# Patient Record
Sex: Female | Born: 1995 | Race: Black or African American | Hispanic: No | Marital: Single | Smoking: Never smoker
Health system: Southern US, Community
[De-identification: ages and names within clinical notes are randomized; demographics above are authoritative.]

## PROBLEM LIST (undated history)

## (undated) DIAGNOSIS — E559 Vitamin D deficiency, unspecified: Secondary | ICD-10-CM

## (undated) DIAGNOSIS — J302 Other seasonal allergic rhinitis: Secondary | ICD-10-CM

## (undated) DIAGNOSIS — K2970 Gastritis, unspecified, without bleeding: Secondary | ICD-10-CM

## (undated) DIAGNOSIS — K219 Gastro-esophageal reflux disease without esophagitis: Secondary | ICD-10-CM

## (undated) DIAGNOSIS — F419 Anxiety disorder, unspecified: Secondary | ICD-10-CM

## (undated) DIAGNOSIS — M6281 Muscle weakness (generalized): Secondary | ICD-10-CM

## (undated) HISTORY — PX: NO PAST SURGERIES: SHX2092

## (undated) HISTORY — DX: Gastritis, unspecified, without bleeding: K29.70

## (undated) HISTORY — DX: Other seasonal allergic rhinitis: J30.2

## (undated) HISTORY — DX: Gastro-esophageal reflux disease without esophagitis: K21.9

## (undated) HISTORY — DX: Vitamin D deficiency, unspecified: E55.9

## (undated) HISTORY — DX: Muscle weakness (generalized): M62.81

## (undated) HISTORY — DX: Anxiety disorder, unspecified: F41.9

---
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
## 2013-11-03 ENCOUNTER — Emergency Department (HOSPITAL_COMMUNITY)
Admission: EM | Admit: 2013-11-03 | Discharge: 2013-11-03 | Disposition: A | Discharge status: Home / Self Care | Payer: 59 | Attending: Emergency Medicine | Admitting: Emergency Medicine

## 2013-11-03 ENCOUNTER — Encounter (HOSPITAL_COMMUNITY): Payer: Self-pay | Admitting: Emergency Medicine

## 2013-11-03 DIAGNOSIS — Y9389 Activity, other specified: Secondary | ICD-10-CM | POA: Insufficient documentation

## 2013-11-03 DIAGNOSIS — IMO0002 Reserved for concepts with insufficient information to code with codable children: Secondary | ICD-10-CM | POA: Insufficient documentation

## 2013-11-03 DIAGNOSIS — K219 Gastro-esophageal reflux disease without esophagitis: Secondary | ICD-10-CM | POA: Insufficient documentation

## 2013-11-03 DIAGNOSIS — Z8659 Personal history of other mental and behavioral disorders: Secondary | ICD-10-CM | POA: Insufficient documentation

## 2013-11-03 DIAGNOSIS — T148XXA Other injury of unspecified body region, initial encounter: Secondary | ICD-10-CM

## 2013-11-03 DIAGNOSIS — Z79899 Other long term (current) drug therapy: Secondary | ICD-10-CM | POA: Insufficient documentation

## 2013-11-03 DIAGNOSIS — Y929 Unspecified place or not applicable: Secondary | ICD-10-CM | POA: Insufficient documentation

## 2013-11-03 DIAGNOSIS — X58XXXA Exposure to other specified factors, initial encounter: Secondary | ICD-10-CM | POA: Insufficient documentation

## 2013-11-03 NOTE — Discharge Instructions (Signed)
Keep your navel clean. Follow up with your doctor for further evaluation.

## 2013-11-03 NOTE — ED Provider Notes (Signed)
Medical screening examination/treatment/procedure(s) were performed by non-physician practitioner and as supervising physician I was immediately available for consultation/collaboration.  EKG Interpretation   None        Delvis Kau F Briar Witherspoon, MD 11/03/13 1201 

## 2013-11-03 NOTE — ED Notes (Signed)
Patient says she has been having "belly button" pain for about two weeks.  The patient says she thinks when she was washing her stomach, one of her nails went in to her belly button and scratched the inside of it.  The patient rates her pain 6/10.  Patient denies discharge from her belly button, or blood, she denies N/V and fever.

## 2013-11-03 NOTE — ED Provider Notes (Signed)
CSN: [id number]     Arrival date & time 11/03/13  0762 History   This chart was scribed for non-physician practitioner Emilia Beck, PA-C, working with Shon Baton, MD, by Yevette Edwards, ED Scribe. This patient was seen in room TR11C/TR11C and the patient's care was started at 10:32 AM. First MD Initiated Contact with Patient 11/03/13 1003     Chief Complaint  Patient presents with  . Abdominal Pain    Patient is having burnig and pain inside her belly button    The history is provided by the patient. No language interpreter was used.   HPI Comments: Bethany Barrera is a 18 y.o. female, with a h/o anxiety, who presents to the Emergency Department complaining of gradually-worsening pain to her navel which began two weeks ago after accidentally scratching it. The pt rates the pain as 6/10, and she characterizes the pain as "throbbing" and  increased with bending. She denies any drainage or bleeding from the site, and she also denies dysuria, nausea, emesis, fever, or abdominal pain not associated with the umbilical region. Bethany Barrera is a non-smoker.   Past Medical History  Diagnosis Date  . GERD (gastroesophageal reflux disease)   . Anxiety    History reviewed. No pertinent past surgical history. Family History  Problem Relation Age of Onset  . Clotting disorder Maternal Grandfather   . Diabetes Maternal Grandfather   . Heart disease Maternal Grandfather   . Breast cancer Paternal Grandmother    History  Substance Use Topics  . Smoking status: Never Smoker   . Smokeless tobacco: Never Used  . Alcohol Use: No   No OB history provided.  Review of Systems  Constitutional: Negative for fever.  Gastrointestinal: Negative for nausea and vomiting.  Genitourinary: Negative for dysuria.  Skin: Positive for wound.  All other systems reviewed and are negative.   Allergies  Sulfa antibiotics  Home Medications   Current Outpatient Rx  Name  Route  Sig  Dispense   Refill  . esomeprazole (NEXIUM) 40 MG capsule   Oral   Take 1 capsule (40 mg total) by mouth daily before breakfast.   30 capsule   6   . fluticasone (FLONASE) 50 MCG/ACT nasal spray   Nasal   Place 2 sprays into the nose daily.         Lorita Officer Triphasic (TRI-SPRINTEC) 0.18/0.215/0.25 MG-35 MCG TABS   Oral   Take 1 tablet by mouth daily.           . ondansetron (ZOFRAN) 8 MG tablet   Oral   Take by mouth every 8 (eight) hours as needed.         . predniSONE (STERAPRED UNI-PAK) 5 MG TABS   Oral   Take by mouth. As directed         . EXPIRED: ranitidine (ZANTAC) 150 MG tablet   Oral   Take 1 tablet (150 mg total) by mouth at bedtime.   60 tablet   3    Triage Vitals: BP 124/68  Pulse 86  Temp(Src) 97.9 F (36.6 C) (Oral)  Resp 18  SpO2 99%  LMP 10/13/2013  Physical Exam  Nursing note and vitals reviewed. Constitutional: She is oriented to person, place, and time. She appears well-developed and well-nourished. No distress.  HENT:  Head: Normocephalic and atraumatic.  Eyes: EOM are normal.  Neck: Neck supple. No tracheal deviation present.  Cardiovascular: Normal rate.   Pulmonary/Chest: Effort normal. No respiratory distress.  Abdominal:  There is no tenderness.  No tenderness to palpation.   Musculoskeletal: Normal range of motion.  Neurological: She is alert and oriented to person, place, and time.  Skin: Skin is warm and dry.  No scab or laceration or drainage noted from navel. No color changes noted to the skin.   Psychiatric: She has a normal mood and affect. Her behavior is normal.    ED Course  Procedures (including critical care time)  DIAGNOSTIC STUDIES: Oxygen Saturation is 99% on room air, normal by my interpretation.    COORDINATION OF CARE:  10:36 AM- Discussed treatment plan with patient, which includes washing the site well and using IBU as needed, and the patient agreed to the plan.   Labs Review Labs Reviewed - No  data to display Imaging Review No results found.  EKG Interpretation   None       MDM   1. Abrasion of skin    10:45 AM Patient has no obvious laceration or drainage from navel. Patient denies any pain while I was manipulating her navel. No drainage noted. Vitals stable and patient afebrile. Patient advised to keep navel clean and follow up with her PCP as needed.   I personally performed the services described in this documentation, which was scribed in my presence. The recorded information has been reviewed and is accurate.   Emilia BeckKaitlyn Aslynn Brunetti, PA-C 11/03/13 1046

## 2014-03-03 ENCOUNTER — Encounter: Payer: Self-pay | Admitting: *Deleted

## 2014-03-09 ENCOUNTER — Encounter: Payer: Self-pay | Admitting: Women's Health

## 2014-03-16 ENCOUNTER — Encounter: Payer: Self-pay | Admitting: Women's Health

## 2014-03-16 ENCOUNTER — Ambulatory Visit (INDEPENDENT_AMBULATORY_CARE_PROVIDER_SITE_OTHER): Payer: 59 | Admitting: Women's Health

## 2014-03-16 VITALS — BP 120/80 | Ht 66.0 in | Wt 209.5 lb

## 2014-03-16 DIAGNOSIS — N949 Unspecified condition associated with female genital organs and menstrual cycle: Secondary | ICD-10-CM

## 2014-03-16 DIAGNOSIS — R102 Pelvic and perineal pain: Secondary | ICD-10-CM

## 2014-03-16 DIAGNOSIS — Z7251 High risk heterosexual behavior: Secondary | ICD-10-CM

## 2014-03-16 DIAGNOSIS — Z113 Encounter for screening for infections with a predominantly sexual mode of transmission: Secondary | ICD-10-CM

## 2014-03-16 DIAGNOSIS — R309 Painful micturition, unspecified: Secondary | ICD-10-CM

## 2014-03-16 DIAGNOSIS — R300 Dysuria: Secondary | ICD-10-CM

## 2014-03-16 DIAGNOSIS — N898 Other specified noninflammatory disorders of vagina: Secondary | ICD-10-CM

## 2014-03-16 LAB — POCT URINALYSIS DIPSTICK
Glucose, UA: NEGATIVE
Ketones, UA: NEGATIVE
Nitrite, UA: NEGATIVE
RBC UA: NEGATIVE

## 2014-03-16 LAB — POCT WET PREP (WET MOUNT): CLUE CELLS WET PREP WHIFF POC: NEGATIVE

## 2014-03-16 NOTE — Progress Notes (Signed)
Patient ID: Bethany Barrera, female   DOB: 1996/06/04, 18 y.o.   MRN: [id number]   [facility]  Patient name: Bethany BruinsHannah M Barrera MRN [id number]  Date of birth: 1996/06/04  CC & HPI:  Bethany Barrera is a 18 y.o. African American female presenting today for report of pelvic pain, malodorous d/c, and dysuria w/ vulvar itching x 1wk, also requests STI screening. Uses condom 'most of the time'. Has periods monthly, cramping has improved since she's been on COCs, occ periods will be 1-2wks late.   Pertinent History Reviewed:  Medical & Surgical Hx:   Past Medical History  Diagnosis Date  . GERD (gastroesophageal reflux disease)   . Anxiety    History reviewed. No pertinent past surgical history. Medications: Reviewed & Updated - see associated section Social History: Reviewed -  reports that she has never smoked. She has never used smokeless tobacco.  Objective Findings:  Vitals: BP 120/80  Ht 5\' 6"  (1.676 m)  Wt 209 lb 8 oz (95.029 kg)  BMI 33.83 kg/m2  LMP 03/04/2014  Physical Examination: General appearance - alert, well appearing, and in no distress Pelvic - thin whitish-yellow malodorous d/c  Results for orders placed in visit on 03/16/14 (from the past 24 hour(s))  POCT URINALYSIS DIPSTICK   Collection Time    03/16/14 12:27 PM      Result Value Ref Range   Color, UA       Clarity, UA       Glucose, UA neg     Bilirubin, UA       Ketones, UA neg     Spec Grav, UA       Blood, UA neg     pH, UA       Protein, UA trace     Urobilinogen, UA       Nitrite, UA neg     Leukocytes, UA moderate (2+)    POCT WET PREP (WET MOUNT)   Collection Time    03/16/14 12:59 PM      Result Value Ref Range   Source Wet Prep POC vaginal     WBC, Wet Prep HPF POC many     Bacteria Wet Prep HPF POC noen     BACTERIA WET PREP MORPHOLOGY POC       Clue Cells Wet Prep HPF POC None     CLUE CELLS WET PREP WHIFF POC Negative Whiff     Yeast Wet Prep HPF POC None      KOH Wet Prep POC       Trichomonas Wet Prep HPF POC none       Assessment & Plan:  A:   Pelvic pain  Malodorous d/c w/ vulvar itching  Dysuria  STI screening P:  GC/CT from urine, send urine for cx, HIV, RPR, HSV2, Hep B&C today   Will call her w/ results   Marge DuncansKimberly Randall Booker CNM, WHNP-BC 03/16/2014 1:00 PM

## 2014-03-16 NOTE — Patient Instructions (Signed)
Sexually Transmitted Disease A sexually transmitted disease (STD) is a disease or infection that may be passed (transmitted) from person to person, usually during sexual activity. This may happen by way of saliva, semen, blood, vaginal mucus, or urine. Common STDs include:   Gonorrhea.   Chlamydia.   Syphilis.   HIV and AIDS.   Genital herpes.   Hepatitis B and C.   Trichomonas.   Human papillomavirus (HPV).   Pubic lice.   Scabies.  Mites.  Bacterial vaginosis. WHAT ARE CAUSES OF STDs? An STD may be caused by bacteria, a virus, or parasites. STDs are often transmitted during sexual activity if one person is infected. However, they may also be transmitted through nonsexual means. STDs may be transmitted after:   Sexual intercourse with an infected person.   Sharing sex toys with an infected person.   Sharing needles with an infected person or using unclean piercing or tattoo needles.  Having intimate contact with the genitals, mouth, or rectal areas of an infected person.   Exposure to infected fluids during birth. WHAT ARE THE SIGNS AND SYMPTOMS OF STDs? Different STDs have different symptoms. Some people may not have any symptoms. If symptoms are present, they may include:   Painful or bloody urination.   Pain in the pelvis, abdomen, vagina, anus, throat, or eyes.   Skin rash, itching, irritation, growths, sores (lesions), ulcerations, or warts in the genital or anal area.  Abnormal vaginal discharge with or without bad odor.   Penile discharge in men.   Fever.   Pain or bleeding during sexual intercourse.   Swollen glands in the groin area.   Yellow skin and eyes (jaundice). This is seen with hepatitis.   Swollen testicles.  Infertility.  Sores and blisters in the mouth. HOW ARE STDs DIAGNOSED? To make a diagnosis, your health care provider may:   Take a medical history.   Perform a physical exam.   Take a sample of any  discharge for examination.  Swab the throat, cervix, opening to the penis, rectum, or vagina for testing.  Test a sample of your first morning urine.   Perform blood tests.   Perform a Pap smear, if this applies.   Perform a colposcopy.   Perform a laparoscopy.  HOW ARE STDs TREATED? Treatment depends on the STD. Some STDs may be treated but not cured.   Chlamydia, gonorrhea, trichomonas, and syphilis can be cured with antibiotics.   Genital herpes, hepatitis, and HIV can be treated, but not cured, with prescribed medicines. The medicines lessen symptoms.   Genital warts from HPV can be treated with medicine or by freezing, burning (electrocautery), or surgery. Warts may come back.   HPV cannot be cured with medicine or surgery. However, abnormal areas may be removed from the cervix, vagina, or vulva.   If your diagnosis is confirmed, your recent sexual partners need treatment. This is true even if they are symptom-free or have a negative culture or evaluation. They should not have sex until their health care providers say it is OK. HOW CAN I REDUCE MY RISK OF GETTING AN STD?  Use latex condoms, dental dams, and water-soluble lubricants during sexual activity. Do not use petroleum jelly or oils.  Get vaccinated for HPV and hepatitis. If you have not received these vaccines in the past, talk to your health care provider about whether one or both might be right for you.   Avoid risky sex practices that can break the skin.  WHAT SHOULD   I DO IF I THINK I HAVE AN STD?  See your health care provider.   Inform all sexual partners. They should be tested and treated for any STDs.  Do not have sex until your health care provider says it is OK. WHEN SHOULD I GET HELP? Seek immediate medical care if:  You develop severe abdominal pain.  You are a man and notice swelling or pain in the testicles.  You are a woman and notice swelling or pain in your vagina. Document  Released: 12/15/2002 Document Revised: 07/15/2013 Document Reviewed: 04/14/2013 [employer] Patient Information 2014 [employer].  

## 2014-03-17 ENCOUNTER — Telehealth: Payer: Self-pay | Admitting: Women's Health

## 2014-03-17 LAB — HIV ANTIBODY (ROUTINE TESTING W REFLEX): HIV 1&2 Ab, 4th Generation: NONREACTIVE

## 2014-03-17 LAB — HSV 2 ANTIBODY, IGG

## 2014-03-17 LAB — GC/CHLAMYDIA PROBE AMP
CT PROBE, AMP APTIMA: NEGATIVE
GC Probe RNA: NEGATIVE

## 2014-03-17 LAB — URINE CULTURE

## 2014-03-17 LAB — HEPATITIS C ANTIBODY: HCV AB: NEGATIVE

## 2014-03-17 LAB — HEPATITIS B SURFACE ANTIGEN: Hepatitis B Surface Ag: NEGATIVE

## 2014-03-17 LAB — RPR

## 2014-03-17 MED ORDER — NAPROXEN 500 MG PO TABS: 500.0000 mg | ORAL_TABLET | Freq: Two times a day (BID) | ORAL | Status: DC

## 2014-03-17 MED ORDER — METRONIDAZOLE 0.75 % VA GEL: 1.0000 | Freq: Every day | VAGINAL | Status: DC

## 2014-03-17 NOTE — Telephone Encounter (Signed)
Notified pt of neg STI screening, will try metrogel for her malodorous d/c w/ vulvar itching. Still having Lt sided pelvic pain not r/b apap, ibuprofen. Will schedule for pelvic u/s and f/u w/ me in 1wk, rx naprosyn 500mg  q 12hr.  Cheral Marker, CNM, WHNP-BC 03/17/2014 3:10 PM

## 2014-03-23 ENCOUNTER — Ambulatory Visit: Payer: 59 | Admitting: Women's Health

## 2014-03-23 ENCOUNTER — Other Ambulatory Visit: Payer: 59

## 2014-03-24 ENCOUNTER — Other Ambulatory Visit: Payer: Self-pay | Admitting: Women's Health

## 2014-03-24 DIAGNOSIS — R102 Pelvic and perineal pain: Secondary | ICD-10-CM

## 2014-03-30 ENCOUNTER — Ambulatory Visit (INDEPENDENT_AMBULATORY_CARE_PROVIDER_SITE_OTHER): Payer: 59

## 2014-03-30 ENCOUNTER — Other Ambulatory Visit: Payer: Self-pay | Admitting: Women's Health

## 2014-03-30 ENCOUNTER — Encounter: Payer: Self-pay | Admitting: Women's Health

## 2014-03-30 ENCOUNTER — Ambulatory Visit (INDEPENDENT_AMBULATORY_CARE_PROVIDER_SITE_OTHER): Payer: 59 | Admitting: Women's Health

## 2014-03-30 VITALS — BP 110/88 | Ht 66.0 in | Wt 208.0 lb

## 2014-03-30 DIAGNOSIS — N949 Unspecified condition associated with female genital organs and menstrual cycle: Secondary | ICD-10-CM

## 2014-03-30 DIAGNOSIS — R102 Pelvic and perineal pain: Secondary | ICD-10-CM

## 2014-03-30 DIAGNOSIS — K5900 Constipation, unspecified: Secondary | ICD-10-CM

## 2014-03-30 DIAGNOSIS — N898 Other specified noninflammatory disorders of vagina: Secondary | ICD-10-CM

## 2014-03-30 LAB — POCT WET PREP (WET MOUNT): CLUE CELLS WET PREP WHIFF POC: NEGATIVE

## 2014-03-30 NOTE — Progress Notes (Signed)
Patient ID: Bethany Barrera, female   DOB: Oct 15, 1995, 18 y.o.   MRN: [id number]   [facility]  Patient name: Bethany BruinsHannah M Barrera MRN [id number]  Date of birth: Oct 15, 1995  CC & HPI:  Bethany Barrera is a 18 y.o. African American female presenting today for f/u after pelvic u/s for pelvic pain. At last visit she was given metrogel for abnormal d/c w/ itching- which helped, then she states it returned when she stopped using it. Doesn't have odor today. Still have occ lower abd/pelvic pain and LBP. U/S normal.  BMs q 3-4d w/ straining. Had neg STI screening last visit.   Pertinent History Reviewed:  Medical & Surgical Hx:   Past Medical History  Diagnosis Date  . GERD (gastroesophageal reflux disease)   . Anxiety    History reviewed. No pertinent past surgical history. Medications: Reviewed & Updated - see associated section Social History: Reviewed -  reports that she has never smoked. She has never used smokeless tobacco.  Objective Findings:  Vitals: BP 110/88  Ht 5\' 6"  (1.676 m)  Wt 208 lb (94.348 kg)  BMI 33.59 kg/m2  LMP 03/29/2014  Physical Examination: General appearance - alert, well appearing, and in no distress Pelvic - normal external genitalia, vulva, vagina, cervix, uterus and adnexa, on menses- no malodor  Today's pelvic u/s:Uterus 6.5 x 3.9 x 2.5 cm, anteverted  Endometrium 3.8 mm, symmetrical,  Right ovary 2.7 x 2.1 x 1.4 cm,  Left ovary 2.6 x 1.7 x 1.5 cm,  No free fluid or adnexal masses noted within the pelvis  Technician Comments:  Anteverted uterus, Endom-3.808mm symmetrical, bilateral adnexa/ovaries appear WNL no free fluid or adnexal masses noted within the pelvis  Chari ManningMcBride, Tasha  03/30/2014  11:51 AM  Results for orders placed in visit on 03/30/14 (from the past 24 hour(s))  POCT WET PREP (WET MOUNT)   Collection Time    03/30/14 12:53 PM      Result Value Ref Range   Source Wet Prep POC vaginal     WBC, Wet Prep HPF POC none      Bacteria Wet Prep HPF POC none     BACTERIA WET PREP MORPHOLOGY POC       Clue Cells Wet Prep HPF POC None     CLUE CELLS WET PREP WHIFF POC Negative Whiff     Yeast Wet Prep HPF POC None     KOH Wet Prep POC       Trichomonas Wet Prep HPF POC none       Assessment & Plan:  A:   Pelvic pain w/o clear etiology, u/s normal  Normal vaginal d/c  Constipation P:  Reviewed constipation relief measures, printed info given  Continue condoms for STI prevention, COCs for contraception   F/U prn, then in Feb when turns 19yo for pap & physical  Call if malodorous d/c, itching/irritation returns  Marge DuncansBooker, Kimberly Randall CNM, [facility]NP-BC 03/30/2014 12:54 PM

## 2014-03-30 NOTE — Patient Instructions (Signed)

## 2014-04-29 ENCOUNTER — Encounter: Payer: Self-pay | Admitting: Advanced Practice Midwife

## 2014-04-29 ENCOUNTER — Ambulatory Visit (INDEPENDENT_AMBULATORY_CARE_PROVIDER_SITE_OTHER): Payer: 59 | Admitting: Advanced Practice Midwife

## 2014-04-29 VITALS — BP 100/80 | Ht 66.0 in | Wt 213.0 lb

## 2014-04-29 DIAGNOSIS — Z7251 High risk heterosexual behavior: Secondary | ICD-10-CM

## 2014-04-29 DIAGNOSIS — Z113 Encounter for screening for infections with a predominantly sexual mode of transmission: Secondary | ICD-10-CM

## 2014-04-29 NOTE — Progress Notes (Signed)
[facility]  Patient name: Bethany Barrera MRN [id number]  Date of birth: [birth year]  CC & HPI:  LEZLI BUENROSTRO is a 18 y.o. African American female presenting today for STD screening and increased vaginal discharge with odor.  She thinks it is BV again, states she has treated herself around 3 times in the last 6 months for it.  She had full STD testing a month ago, but does not really trust her boyfriend.  Does not use condoms  Pertinent History Reviewed:  Medical & Surgical Hx:   Past Medical History  Diagnosis Date  . GERD (gastroesophageal reflux disease)   . Anxiety    History reviewed. No pertinent past surgical history. Medications: Reviewed & Updated - see associated section Social History: Reviewed -  reports that she has never smoked. She has never used smokeless tobacco.  Objective Findings:  Vitals: BP 100/80  Ht 5\' 6"  (1.676 m)  Wt 213 lb (96.616 kg)  BMI 34.40 kg/m2  LMP 04/07/2014  Physical Examination: General appearance - alert, well appearing, and in no distress Mental status - alert, oriented to person, place, and time Abdomen - soft, nontender, nondistended, no masses or organomegaly Pelvic - SSE:  Thick yellow discharge, sl amine odor. Wet prep TNTC WBC;s, no trich or yeast.  Very few clue cells.  Negative CMT Skin - normal coloration and turgor, no rashes, no suspicious skin lesions noted  No results found for this or any previous visit (from the past 24 hour(s)).   Assessment & Plan:  A:   Leukorrhea  STD screening P:  Condoms!!  Clindesse sample given and vaginal probiotic info given; Consider cipro if cultures negative and sx persist.  Pt aware that insurance companies may not cover STD screening every month  F/U prn   CRESENZO-DISHMAN,Hershey Knauer CNM 04/29/2014 9:43 AM

## 2014-04-29 NOTE — Patient Instructions (Signed)
UP4 ADULT Superior, compatible and safe strains DDS  -1 L.acidophilus (super strain) with B. Longum, B.Bifidum & B.Infantis Acid-resistant-survives stomach acid and Bile salts   Fortified with prebiotic Fructooligosaccharide to enhance growth and performance Potency: 15 Billion CFU/capsule Dosage: 1 capsule daily, best before a meal.  [employer]:  $21.90 for 60 caps   iF THERE IS STILL A PROBLEM ADD,   FLORAJEN ACIDOPHILUS High Potency Acidophilus Florajen high potency acidophilus is especially effective for restoring and maintaining a healthy, comfortable balance of vaginal flora. It is also beneficial for overall intestinal health and maintaining the immune system. 20 Billion live cultures per capsule Available in 30 and 60 capsules   [employer]:  $19.99 for 60 caps  

## 2014-04-30 LAB — HIV ANTIBODY (ROUTINE TESTING W REFLEX): HIV 1&2 Ab, 4th Generation: NONREACTIVE

## 2014-04-30 LAB — HEPATITIS B SURFACE ANTIGEN: HEP B S AG: NEGATIVE

## 2014-04-30 LAB — GC/CHLAMYDIA PROBE AMP
CT PROBE, AMP APTIMA: NEGATIVE
GC PROBE AMP APTIMA: NEGATIVE

## 2014-04-30 LAB — RPR

## 2014-05-03 LAB — HSV 2 ANTIBODY, IGG

## 2014-06-21 ENCOUNTER — Emergency Department (HOSPITAL_COMMUNITY)
Admission: EM | Admit: 2014-06-21 | Discharge: 2014-06-21 | Disposition: A | Discharge status: Home / Self Care | Payer: 59 | Attending: Emergency Medicine | Admitting: Emergency Medicine

## 2014-06-21 ENCOUNTER — Encounter (HOSPITAL_COMMUNITY): Payer: Self-pay | Admitting: Emergency Medicine

## 2014-06-21 DIAGNOSIS — Z791 Long term (current) use of non-steroidal anti-inflammatories (NSAID): Secondary | ICD-10-CM | POA: Diagnosis not present

## 2014-06-21 DIAGNOSIS — Z8659 Personal history of other mental and behavioral disorders: Secondary | ICD-10-CM | POA: Insufficient documentation

## 2014-06-21 DIAGNOSIS — R197 Diarrhea, unspecified: Secondary | ICD-10-CM | POA: Insufficient documentation

## 2014-06-21 DIAGNOSIS — R51 Headache: Secondary | ICD-10-CM | POA: Insufficient documentation

## 2014-06-21 DIAGNOSIS — IMO0002 Reserved for concepts with insufficient information to code with codable children: Secondary | ICD-10-CM | POA: Insufficient documentation

## 2014-06-21 DIAGNOSIS — K219 Gastro-esophageal reflux disease without esophagitis: Secondary | ICD-10-CM | POA: Diagnosis not present

## 2014-06-21 DIAGNOSIS — N390 Urinary tract infection, site not specified: Secondary | ICD-10-CM | POA: Diagnosis not present

## 2014-06-21 DIAGNOSIS — M542 Cervicalgia: Secondary | ICD-10-CM | POA: Diagnosis not present

## 2014-06-21 DIAGNOSIS — H8000 Otosclerosis involving oval window, nonobliterative, unspecified ear: Secondary | ICD-10-CM | POA: Diagnosis not present

## 2014-06-21 DIAGNOSIS — Z3202 Encounter for pregnancy test, result negative: Secondary | ICD-10-CM | POA: Insufficient documentation

## 2014-06-21 DIAGNOSIS — Z79899 Other long term (current) drug therapy: Secondary | ICD-10-CM | POA: Insufficient documentation

## 2014-06-21 DIAGNOSIS — R112 Nausea with vomiting, unspecified: Secondary | ICD-10-CM | POA: Diagnosis not present

## 2014-06-21 LAB — CBC WITH DIFFERENTIAL/PLATELET
Basophils Absolute: 0 10*3/uL (ref 0.0–0.1)
Basophils Relative: 0 % (ref 0–1)
EOS PCT: 0 % (ref 0–5)
Eosinophils Absolute: 0 10*3/uL (ref 0.0–0.7)
HEMATOCRIT: 39.5 % (ref 36.0–46.0)
HEMOGLOBIN: 13.8 g/dL (ref 12.0–15.0)
Lymphocytes Relative: 17 % (ref 12–46)
Lymphs Abs: 2.1 10*3/uL (ref 0.7–4.0)
MCH: 27.5 pg (ref 26.0–34.0)
MCHC: 34.9 g/dL (ref 30.0–36.0)
MCV: 78.7 fL (ref 78.0–100.0)
MONO ABS: 0.6 10*3/uL (ref 0.1–1.0)
Monocytes Relative: 5 % (ref 3–12)
Neutro Abs: 9.5 10*3/uL — ABNORMAL HIGH (ref 1.7–7.7)
Neutrophils Relative %: 78 % — ABNORMAL HIGH (ref 43–77)
Platelets: 339 10*3/uL (ref 150–400)
RBC: 5.02 MIL/uL (ref 3.87–5.11)
RDW: 13.3 % (ref 11.5–15.5)
WBC: 12.2 10*3/uL — AB (ref 4.0–10.5)

## 2014-06-21 LAB — URINALYSIS, ROUTINE W REFLEX MICROSCOPIC
GLUCOSE, UA: NEGATIVE mg/dL
KETONES UR: 40 mg/dL — AB
Nitrite: NEGATIVE
Specific Gravity, Urine: 1.025 (ref 1.005–1.030)
Urobilinogen, UA: 0.2 mg/dL (ref 0.0–1.0)
pH: 6 (ref 5.0–8.0)

## 2014-06-21 LAB — COMPREHENSIVE METABOLIC PANEL
ALT: 7 U/L (ref 0–35)
AST: 13 U/L (ref 0–37)
Albumin: 3.2 g/dL — ABNORMAL LOW (ref 3.5–5.2)
Alkaline Phosphatase: 91 U/L (ref 39–117)
Anion gap: 14 (ref 5–15)
BILIRUBIN TOTAL: 0.2 mg/dL — AB (ref 0.3–1.2)
BUN: 7 mg/dL (ref 6–23)
CALCIUM: 8.7 mg/dL (ref 8.4–10.5)
CHLORIDE: 102 meq/L (ref 96–112)
CO2: 21 mEq/L (ref 19–32)
CREATININE: 0.78 mg/dL (ref 0.50–1.10)
GFR calc non Af Amer: >90 mL/min (ref >90)
GLUCOSE: 87 mg/dL (ref 70–99)
Potassium: 4.5 mEq/L (ref 3.7–5.3)
Sodium: 137 mEq/L (ref 137–147)
Total Protein: 7.5 g/dL (ref 6.0–8.3)

## 2014-06-21 LAB — URINE MICROSCOPIC-ADD ON

## 2014-06-21 LAB — POC URINE PREG, ED: Preg Test, Ur: NEGATIVE

## 2014-06-21 MED ORDER — OXYCODONE-ACETAMINOPHEN 5-325 MG PO TABS: 1.0000 | ORAL_TABLET | ORAL | Status: DC | PRN

## 2014-06-21 MED ORDER — KETOROLAC TROMETHAMINE 30 MG/ML IJ SOLN
30.0000 mg | Freq: Once | INTRAMUSCULAR | Status: AC
  Administered 2014-06-21: 30 mg via INTRAVENOUS
  Filled 2014-06-21: qty 1

## 2014-06-21 MED ORDER — DEXTROSE 5 % IV SOLN
1.0000 g | Freq: Once | INTRAVENOUS | Status: AC
  Administered 2014-06-21: 1 g via INTRAVENOUS
  Filled 2014-06-21: qty 10

## 2014-06-21 MED ORDER — SODIUM CHLORIDE 0.9 % IV BOLUS (SEPSIS)
1000.0000 mL | Freq: Once | INTRAVENOUS | Status: AC
  Administered 2014-06-21: 1000 mL via INTRAVENOUS

## 2014-06-21 MED ORDER — PROMETHAZINE HCL 25 MG/ML IJ SOLN
25.0000 mg | Freq: Once | INTRAMUSCULAR | Status: AC
  Administered 2014-06-21: 25 mg via INTRAVENOUS
  Filled 2014-06-21: qty 1

## 2014-06-21 MED ORDER — CEPHALEXIN 500 MG PO CAPS: 500.0000 mg | ORAL_CAPSULE | Freq: Three times a day (TID) | ORAL | Status: DC

## 2014-06-21 MED ORDER — ONDANSETRON 8 MG PO TBDP: 8.0000 mg | ORAL_TABLET | Freq: Three times a day (TID) | ORAL | Status: DC | PRN

## 2014-06-21 NOTE — Discharge Instructions (Signed)

## 2014-06-21 NOTE — ED Notes (Signed)
Patient sent from [facility] for treatment of severe headache x 3 days. Patient also complaining of vomiting and diarrhea.

## 2014-06-21 NOTE — ED Notes (Signed)
Pt c/o HA x 3 days along with some neck soreness.  States it hurts to turn her head.  Headache located in parietal areas on both sides, described as pounding and throbbing, rated 9/10.  Pt took ibuprofen at home without relief. Denies hx of migraines.  Pt c/o of n/v/d x 3 days.  Reports dizziness and weakness.   Pt also reports ear ache.  NAD, respirations equal and unlabored, skin warm and dry, mucous membranes moist and pink.

## 2014-06-21 NOTE — ED Provider Notes (Signed)
CSN: [id number]     Arrival date & time 06/21/14  0810 History  This chart was scribed for Hilario Quarry, MD by Milly Jakob, ED Scribe. The patient was seen in room APA09/APA09. Patient's care was started at 8:34 AM.   Chief Complaint  Patient presents with  . Headache   Patient is a 18 y.o. female presenting with headaches. No language interpreter was used.  Headache Pain location:  Frontal Quality:  Stabbing Radiates to:  L neck and R neck Severity currently:  9/10 Onset quality:  Gradual Progression:  Worsening Chronicity:  Recurrent Similar to prior headaches: no   Relieved by:  Nothing Worsened by:  Neck movement Ineffective treatments:  NSAIDs Associated symptoms: ear pain and neck pain    HPI Comments: Bethany Barrera is a 18 y.o. female who presents to the Emergency Department complaining of a severe, gradually onset headache which began 4 days ago. She rates the pain as a 9/10 currently. She reports that the pain began as a tightness in the back of her neck bilaterally, and it progressed into a constant pounding pain in the top of her head. She reports associated pain in her ears bilaterally. She reports taking Tylenol last night with minimal relief. She reports associated nausea and 1 episode of vomiting last night, 2 episodes Sunday, 1 episode Saturday, and 1 episode Friday. She reports associated diarrhea two or three times per day. She reports eating less than usual. She denies a headache this severe in the past, congestion, or fever. She reports a history of headache, but denies being prescribed tramadol for headaches in the past. She denies a history of allergies or sinus problems. She states that her periods are regular. She is unsure whether her immunizations are UTD. She reports an allergy to Sulfa and Tramadol.    Past Medical History  Diagnosis Date  . GERD (gastroesophageal reflux disease)   . Anxiety    History reviewed. No pertinent past surgical  history. Family History  Problem Relation Age of Onset  . Clotting disorder Maternal Grandfather   . Diabetes Maternal Grandfather   . Heart disease Maternal Grandfather   . Breast cancer Paternal Grandmother   . Diabetes Mother   . Other Mother     gallstones  . Other Sister     pre-diabetes  . Other Sister     gallstones   History  Substance Use Topics  . Smoking status: Never Smoker   . Smokeless tobacco: Never Used  . Alcohol Use: No   OB History   Grav Para Term Preterm Abortions TAB SAB Ect Mult Living                 Review of Systems  HENT: Positive for ear pain.   Musculoskeletal: Positive for neck pain.  Neurological: Positive for headaches.  All other systems reviewed and are negative.  Allergies  Sulfa antibiotics and Tramadol  Home Medications   Prior to Admission medications   Medication Sig Start Date End Date Taking? Authorizing Provider  Docusate Sodium (COLACE PO) Take by mouth 2 (two) times daily.    Historical Provider, MD  esomeprazole (NEXIUM) 40 MG capsule Take 1 capsule (40 mg total) by mouth daily before breakfast. 12/11/11   Beverley Fiedler, MD  fluticasone [facility] ALLERGY RELIEF) 50 MCG/ACT nasal spray Place into both nostrils daily.    Historical Provider, MD  Multiple Vitamin (MULTIVITAMIN WITH MINERALS) TABS tablet Take 1 tablet by mouth daily.  Historical Provider, MD  naproxen (NAPROSYN) 500 MG tablet Take 1 tablet (500 mg total) by mouth 2 (two) times daily with a meal. 03/17/14   Marge Duncans, CNM  Norgestim-Eth Estrad Triphasic (TRI-SPRINTEC) 0.18/0.215/0.25 MG-35 MCG TABS Take 1 tablet by mouth daily.   11/30/10   Historical Provider, MD  ranitidine (ZANTAC) 150 MG tablet Take 1 tablet (150 mg total) by mouth at bedtime. 12/11/11 12/10/12  Beverley Fiedler, MD   Triage Vitals: BP 117/69  Pulse 76  Temp(Src) 98.3 F (36.8 C) (Oral)  Ht  (1.676 m)  Wt 210 lb (95.255 kg)  BMI 33.91 kg/m2  SpO2 99%  LMP 06/07/2014 Physical  Exam  Nursing note and vitals reviewed. Constitutional:  Awake, alert, nontoxic appearance with baseline speech for patient.  HENT:  Head: Atraumatic.  Right Ear: Tympanic membrane normal.  Left Ear: Tympanic membrane normal.  Mouth/Throat: No oropharyngeal exudate.  Eyes: EOM are normal. Pupils are equal, round, and reactive to light. Right eye exhibits no discharge. Left eye exhibits no discharge.  Neck: Neck supple.  Cardiovascular: Normal rate and regular rhythm.   No murmur heard. Pulmonary/Chest: Effort normal and breath sounds normal. No stridor. No respiratory distress. She has no wheezes. She has no rales. She exhibits no tenderness.  Abdominal: Soft. Bowel sounds are normal. She exhibits no mass. There is no tenderness. There is no rebound.  Musculoskeletal: She exhibits no tenderness.  Baseline ROM, moves extremities with no obvious new focal weakness.  Lymphadenopathy:    She has no cervical adenopathy.  Neurological:  Awake, alert, cooperative and aware of situation; motor strength bilaterally; sensation normal to light touch bilaterally; peripheral visual fields full to confrontation; no facial asymmetry; tongue midline; major cranial nerves appear intact; no pronator drift, normal finger to nose bilaterally, baseline gait without new ataxia.  Skin: No rash noted.  Psychiatric: She has a normal mood and affect.    ED Course  Procedures (including critical care time) DIAGNOSTIC STUDIES: Oxygen Saturation is 99% on room air, normal by my interpretation.    COORDINATION OF CARE: 8:42 AM-Discussed treatment plan which includes CBC, UA, Phenergan, Toradol, and IV fluids with pt at bedside and pt agreed to plan.   11:28 AM - Discussed Urinary Tract Infection with the Pt, and treatment plan including ABX, nausea medication, and pain medication. Discussed plan to follow up in 4 days if symptoms are not improved. Pt was agreeable to plan.  Labs Review Labs Reviewed  CBC WITH  DIFFERENTIAL - Abnormal; Notable for the following:    WBC 12.2 (*)    Neutrophils Relative % 78 (*)    Neutro Abs 9.5 (*)    All other components within normal limits  COMPREHENSIVE METABOLIC PANEL - Abnormal; Notable for the following:    Albumin 3.2 (*)    Total Bilirubin 0.2 (*)    All other components within normal limits  URINALYSIS, ROUTINE W REFLEX MICROSCOPIC - Abnormal; Notable for the following:    Hgb urine dipstick TRACE (*)    Bilirubin Urine MODERATE (*)    Ketones, ur 40 (*)    Protein, ur TRACE (*)    Leukocytes, UA MODERATE (*)    All other components within normal limits  URINE MICROSCOPIC-ADD ON - Abnormal; Notable for the following:    Squamous Epithelial / LPF MANY (*)    Bacteria, UA MANY (*)    All other components within normal limits  POC URINE PREG, ED    Imaging Review  No results found.   EKG Interpretation None      MDM   Final diagnoses:  UTI (lower urinary tract infection)  18 y.o. Female with headache, nausea, vomiting and diarrhea.  Urinalysis significant for uti.  Treated here with rocephin.  Plan op keflex and antiemetic.  Patient with symptoms improved here- no vomiting or diarrhea, headache better.  Discussed return precautions and need for follow up and patient and mother voice understanding.   I personally performed the services described in this documentation, which was scribed in my presence. The recorded information has been reviewed and considered. t}   Hilario Quarry, MD 06/25/14 [phone]

## 2014-06-29 ENCOUNTER — Ambulatory Visit (HOSPITAL_COMMUNITY)
Admission: RE | Admit: 2014-06-29 | Discharge: 2014-06-29 | Disposition: A | Discharge status: Home / Self Care | Payer: 59 | Source: Ambulatory Visit | Attending: Internal Medicine | Admitting: Internal Medicine

## 2014-06-29 ENCOUNTER — Other Ambulatory Visit (HOSPITAL_COMMUNITY): Payer: Self-pay | Admitting: Internal Medicine

## 2014-06-29 DIAGNOSIS — M4040 Postural lordosis, site unspecified: Secondary | ICD-10-CM | POA: Insufficient documentation

## 2014-06-29 DIAGNOSIS — M542 Cervicalgia: Secondary | ICD-10-CM | POA: Insufficient documentation

## 2014-07-05 ENCOUNTER — Other Ambulatory Visit (HOSPITAL_COMMUNITY): Payer: Self-pay | Admitting: Internal Medicine

## 2014-07-05 DIAGNOSIS — R51 Headache: Secondary | ICD-10-CM

## 2014-07-05 DIAGNOSIS — M5020 Other cervical disc displacement, unspecified cervical region: Secondary | ICD-10-CM

## 2014-07-05 DIAGNOSIS — R519 Headache, unspecified: Secondary | ICD-10-CM

## 2014-07-07 ENCOUNTER — Ambulatory Visit (HOSPITAL_COMMUNITY)
Admission: RE | Admit: 2014-07-07 | Discharge: 2014-07-07 | Disposition: A | Discharge status: Home / Self Care | Payer: 59 | Source: Ambulatory Visit | Attending: Internal Medicine | Admitting: Internal Medicine

## 2014-07-07 ENCOUNTER — Other Ambulatory Visit (HOSPITAL_COMMUNITY): Payer: Self-pay | Admitting: Internal Medicine

## 2014-07-07 DIAGNOSIS — R519 Headache, unspecified: Secondary | ICD-10-CM

## 2014-07-07 DIAGNOSIS — M5020 Other cervical disc displacement, unspecified cervical region: Secondary | ICD-10-CM

## 2014-07-07 DIAGNOSIS — R51 Headache: Secondary | ICD-10-CM

## 2014-07-07 DIAGNOSIS — M542 Cervicalgia: Secondary | ICD-10-CM | POA: Diagnosis present

## 2014-09-15 ENCOUNTER — Encounter: Payer: Self-pay | Admitting: Internal Medicine

## 2014-09-22 ENCOUNTER — Ambulatory Visit: Payer: 59 | Admitting: Advanced Practice Midwife

## 2014-09-23 ENCOUNTER — Ambulatory Visit: Payer: 59 | Admitting: Advanced Practice Midwife

## 2014-09-28 ENCOUNTER — Encounter: Payer: Self-pay | Admitting: Advanced Practice Midwife

## 2014-09-28 ENCOUNTER — Ambulatory Visit (INDEPENDENT_AMBULATORY_CARE_PROVIDER_SITE_OTHER): Payer: 59 | Admitting: Advanced Practice Midwife

## 2014-09-28 VITALS — BP 118/76 | Ht 66.0 in | Wt 217.0 lb

## 2014-09-28 DIAGNOSIS — N898 Other specified noninflammatory disorders of vagina: Secondary | ICD-10-CM

## 2014-09-28 LAB — POCT URINALYSIS DIPSTICK
Blood, UA: 3
GLUCOSE UA: NEGATIVE
Ketones, UA: NEGATIVE
NITRITE UA: NEGATIVE
Protein, UA: NEGATIVE

## 2014-09-28 MED ORDER — FLUCONAZOLE 150 MG PO TABS: 150.0000 mg | ORAL_TABLET | Freq: Every day | ORAL | Status: DC

## 2014-09-28 MED ORDER — METRONIDAZOLE 500 MG PO TABS: 500.0000 mg | ORAL_TABLET | Freq: Two times a day (BID) | ORAL | Status: DC

## 2014-09-28 NOTE — Progress Notes (Signed)
[facility] Visit  Patient name: Bethany Barrera MRN [id number]  Date of birth: [birth year]  CC & HPI:  Bethany Barrera is a 18 y.o. African American female presenting today for vaginal itching and discharge for a month. Has taken 2 abx last month for UTI  Pertinent History Reviewed:  Medical & Surgical Hx:   Past Medical History  Diagnosis Date  . GERD (gastroesophageal reflux disease)   . Anxiety    History reviewed. No pertinent past surgical history. Current outpatient prescriptions: esomeprazole (NEXIUM) 40 MG capsule, Take 1 capsule (40 mg total) by mouth daily before breakfast., Disp: 30 capsule, Rfl: 6;  fluticasone (FLONASE ALLERGY RELIEF) 50 MCG/ACT nasal spray, Place into both nostrils daily., Disp: , Rfl: ;  Norgestim-Eth Estrad Triphasic (TRI-SPRINTEC) 0.18/0.215/0.25 MG-35 MCG TABS, Take 1 tablet by mouth daily.  , Disp: , Rfl:  fluconazole (DIFLUCAN) 150 MG tablet, Take 1 tablet (150 mg total) by mouth daily. X1; may repeat in 3 days if symptoms still present, Disp: 2 tablet, Rfl: 2;  metroNIDAZOLE (FLAGYL) 500 MG tablet, Take 1 tablet (500 mg total) by mouth 2 (two) times daily., Disp: 14 tablet, Rfl: 0 Social History: Reviewed -  reports that she has never smoked. She has never used smokeless tobacco.  Objective Findings:  Vitals: BP 118/76 mmHg  Ht 5\' 6"  (1.676 m)  Wt 217 lb (98.431 kg)  BMI 35.04 kg/m2  LMP 09/28/2014  Physical Examination: General appearance - alert, well appearing, and in no distress Mental status - alert, oriented to person, place, and time Pelvic - Vulva erythemous.  SSE:  Menstrual blood and white plaques on vaginal sidewalls.  Wet prep:  Few WBC, no trich.  + yeast and few clue Results for orders placed or performed in visit on 09/28/14 (from the past 24 hour(s))  POCT urinalysis dipstick   Collection Time: 09/28/14  8:43 AM  Result Value Ref Range   Color, UA     Clarity, UA     Glucose, UA neg    Bilirubin, UA     Ketones, UA neg    Spec Grav, UA     Blood, UA 3    pH, UA     Protein, UA neg    Urobilinogen, UA     Nitrite, UA neg    Leukocytes, UA moderate (2+)      Assessment & Plan:  A:   Yeast, Mild BV P:  Diflucan and flagyl  GC/CHL testing d/t WBC   Bethany Barrera CNM 09/28/2014 9:32 AM

## 2014-09-29 LAB — GC/CHLAMYDIA PROBE AMP
CT PROBE, AMP APTIMA: NEGATIVE
GC PROBE AMP APTIMA: NEGATIVE

## 2014-10-19 ENCOUNTER — Telehealth: Payer: Self-pay | Admitting: Nurse Practitioner

## 2014-10-19 ENCOUNTER — Encounter: Payer: Self-pay | Admitting: Nurse Practitioner

## 2014-10-19 ENCOUNTER — Ambulatory Visit: Payer: 59 | Admitting: Nurse Practitioner

## 2014-10-19 NOTE — Telephone Encounter (Signed)
PATIENT WAS A NO SHOW 10/19/14 AND LETTER WAS SENT °

## 2014-10-20 NOTE — Telephone Encounter (Signed)
Noted  

## 2015-03-24 ENCOUNTER — Encounter (INDEPENDENT_AMBULATORY_CARE_PROVIDER_SITE_OTHER): Payer: Self-pay

## 2015-03-24 ENCOUNTER — Ambulatory Visit (INDEPENDENT_AMBULATORY_CARE_PROVIDER_SITE_OTHER): Payer: 59 | Admitting: Internal Medicine

## 2015-03-24 ENCOUNTER — Encounter: Payer: Self-pay | Admitting: Internal Medicine

## 2015-03-24 VITALS — BP 108/74 | HR 96 | Ht 65.25 in | Wt 222.2 lb

## 2015-03-24 DIAGNOSIS — K219 Gastro-esophageal reflux disease without esophagitis: Secondary | ICD-10-CM

## 2015-03-24 DIAGNOSIS — K5900 Constipation, unspecified: Secondary | ICD-10-CM

## 2015-03-24 MED ORDER — RANITIDINE HCL 150 MG PO TABS
150.0000 mg | ORAL_TABLET | Freq: Every day | ORAL | Status: DC | PRN
Start: 2015-03-24 — End: 2015-11-23

## 2015-03-24 MED ORDER — PANTOPRAZOLE SODIUM 40 MG PO TBEC: 40.0000 mg | DELAYED_RELEASE_TABLET | Freq: Every day | ORAL | Status: DC

## 2015-03-24 NOTE — Progress Notes (Signed)
   Subjective:    Patient ID: Bethany Barrera, female    DOB: Nov 28, 1995, 19 y.o.   MRN: [id number]  HPI [name] is a 19 year old female initially seen on 12/12/2011 to evaluate heartburn and reflux. At that time symptoms had improved on Nexium 40 mg daily. She reports that she doesn't feel this is helping. She is having heartburn multiple days per week along with water brash. She feels regurgitation of sour tasting fluid when lying down and also when straining to have a bowel movement. She feels very rarely slow transit of solids after swallowing but no liquid dysphagia. No odynophagia. Mild nausea but no vomiting. Appetite has not been affected. She denies cough. She does report issues with constipation having a bowel movement every 3-4 days. Stools can be hard and require straining to pass. She denies rectal bleeding or melena. She tried Colace for constipation but it lost efficacy.  We discussed how she is using Nexium and it seems it on occasion she forgets to take it or takes it late in the day. Ranitidine is not currently being used and she doesn't recall this medicine  Last menstrual period 6 days ago   Review of Systems As per history of present illness, otherwise negative  Current Medications, Allergies, Past Medical History, Past Surgical History, Family History and Social History were reviewed in [employer] record.     Objective:   Physical Exam BP 108/74 mmHg  Pulse 96  Ht 5' 5.25" (1.657 m)  Wt 222 lb 4 oz (100.812 kg)  BMI 36.72 kg/m2  LMP 03/17/2015 Constitutional: Well-developed and well-nourished. No distress. HEENT: Normocephalic and atraumatic. Oropharynx is clear and moist. No oropharyngeal exudate. Conjunctivae are normal.  No scleral icterus. Neck: Neck supple. Trachea midline. Cardiovascular: Normal rate, regular rhythm and intact distal pulses. No M/R/G Pulmonary/chest: Effort normal and breath sounds normal. No wheezing,  rales or rhonchi. Abdominal: Soft, diffuse mild tenderness without rebound or guarding, nondistended. Bowel sounds active throughout. There are no masses palpable. No hepatosplenomegaly. Extremities: no clubbing, cyanosis, or edema Lymphadenopathy: No cervical adenopathy noted. Neurological: Alert and oriented to person place and time. Skin: Skin is warm and dry. No rashes noted. Psychiatric: Normal mood and affect. Behavior is normal.      Assessment & Plan:  19 year old female presenting to discuss ongoing heartburn and constipation  1. GERD -- uncontrolled though possibly not taking Nexium on schedule as prescribed. I recommended upper endoscopy given ongoing symptoms despite PPI most days. We discussed the test including the risks and benefits and she is agreeable to proceed. Discontinue Nexium. Begin pantoprazole 40 mg. We discussed that this is best used 30 minutes before breakfast every day. Prescription for ranitidine 150 mg to be used in the evening or at bedtime for breakthrough symptoms only. This was thoroughly explained. GERD diet again reviewed and encouraged.  2. Constipation -- mild. We discussed medication versus fiber supplement. I have stressed fiber supplement and she agrees to try. Benefiber 1-2 tablespoons daily on a consistent basis. I asked that she let me know if symptoms fail to improve.  Further recommendations after endoscopy

## 2015-03-24 NOTE — Patient Instructions (Signed)
You have been scheduled for an endoscopy. Please follow written instructions given to you at your visit today. If you use inhalers (even only as needed), please bring them with you on the day of your procedure. Your physician has requested that you go to www.startemmi.com and enter the access code given to you at your visit today. This web site gives a general overview about your procedure. However, you should still follow specific instructions given to you by our office regarding your preparation for the procedure.  We have sent the following medications to your pharmacy for you to pick up at your convenience: Pantoprazole 40 mg 1 tab by mouth daily Ranitidine 150 mg 1 tab in the evening as needed   Please discontinue taking Nexium  Please purchase the following medications over the counter and take as directed: Benefiber take 1-2 tablespoons daily for constipation

## 2015-03-28 ENCOUNTER — Encounter: Payer: Self-pay | Admitting: Internal Medicine

## 2015-03-28 ENCOUNTER — Ambulatory Visit (AMBULATORY_SURGERY_CENTER): Payer: 59 | Admitting: Internal Medicine

## 2015-03-28 VITALS — BP 136/87 | HR 82 | Temp 97.7°F | Resp 15 | Ht 65.0 in | Wt 222.0 lb

## 2015-03-28 DIAGNOSIS — K219 Gastro-esophageal reflux disease without esophagitis: Secondary | ICD-10-CM

## 2015-03-28 DIAGNOSIS — K2950 Unspecified chronic gastritis without bleeding: Secondary | ICD-10-CM | POA: Diagnosis not present

## 2015-03-28 MED ORDER — SODIUM CHLORIDE 0.9 % IV SOLN: 500.0000 mL | INTRAVENOUS | Status: DC

## 2015-03-28 NOTE — Patient Instructions (Signed)
Discharge instructions given. Biopsies taken. Resume previous medications. YOU HAD AN ENDOSCOPIC PROCEDURE TODAY AT THE [facility]:   Refer to the procedure report that was given to you for any specific questions about what was found during the examination.  If the procedure report does not answer your questions, please call your gastroenterologist to clarify.  If you requested that your care partner not be given the details of your procedure findings, then the procedure report has been included in a sealed envelope for you to review at your convenience later.  YOU SHOULD EXPECT: Some feelings of bloating in the abdomen. Passage of more gas than usual.  Walking can help get rid of the air that was put into your GI tract during the procedure and reduce the bloating. If you had a lower endoscopy (such as a colonoscopy or flexible sigmoidoscopy) you may notice spotting of blood in your stool or on the toilet paper. If you underwent a bowel prep for your procedure, you may not have a normal bowel movement for a few days.  Please Note:  You might notice some irritation and congestion in your nose or some drainage.  This is from the oxygen used during your procedure.  There is no need for concern and it should clear up in a day or so.  SYMPTOMS TO REPORT IMMEDIATELY:   Following upper endoscopy (EGD)  Vomiting of blood or coffee ground material  New chest pain or pain under the shoulder blades  Painful or persistently difficult swallowing  New shortness of breath  Fever of 100F or higher  Black, tarry-looking stools  For urgent or emergent issues, a gastroenterologist can be reached at any hour by calling [phone].   DIET: Your first meal following the procedure should be a small meal and then it is ok to progress to your normal diet. Heavy or fried foods are harder to digest and may make you feel nauseous or bloated.  Likewise, meals heavy in dairy and vegetables can increase  bloating.  Drink plenty of fluids but you should avoid alcoholic beverages for 24 hours.  ACTIVITY:  You should plan to take it easy for the rest of today and you should NOT DRIVE or use heavy machinery until tomorrow (because of the sedation medicines used during the test).    FOLLOW UP: Our staff will call the number listed on your records the next business day following your procedure to check on you and address any questions or concerns that you may have regarding the information given to you following your procedure. If we do not reach you, we will leave a message.  However, if you are feeling well and you are not experiencing any problems, there is no need to return our call.  We will assume that you have returned to your regular daily activities without incident.  If any biopsies were taken you will be contacted by phone or by letter within the next 1-3 weeks.  Please call us at [phone] if you have not heard about the biopsies in 3 weeks.    SIGNATURES/CONFIDENTIALITY: You and/or your care partner have signed paperwork which will be entered into your electronic medical record.  These signatures attest to the fact that that the information above on your After Visit Summary has been reviewed and is understood.  Full responsibility of the confidentiality of this discharge information lies with you and/or your care-partner. 

## 2015-03-28 NOTE — Progress Notes (Signed)
Called to room to assist during endoscopic procedure.  Patient ID and intended procedure confirmed with present staff. Received instructions for my participation in the procedure from the performing physician.  

## 2015-03-28 NOTE — Progress Notes (Signed)
Report to PACU, RN, vss, BBS= Clear.  

## 2015-03-28 NOTE — Op Note (Signed)
[facility] [address] [place], [address]   ENDOSCOPY PROCEDURE REPORT  PATIENT: Bethany Barrera, Bethany Barrera  MR#: [id number] BIRTHDATE: 05-Jun-1996 , 19  yrs. old GENDER: female ENDOSCOPIST: Beverley Fiedler, MD PROCEDURE DATE:  03/28/2015 PROCEDURE:  EGD, diagnostic and EGD w/ biopsy ASA CLASS:     Class II INDICATIONS:  history of GERD. MEDICATIONS: Monitored anesthesia care and Propofol 200 mg IV TOPICAL ANESTHETIC: none  DESCRIPTION OF PROCEDURE: After the risks benefits and alternatives of the procedure were thoroughly explained, informed consent was obtained.  The [id number] endoscope was introduced through the mouth and advanced to the second portion of the duodenum , Without limitations.  The instrument was slowly withdrawn as the mucosa was fully examined.    EXAM: The esophagus and gastroesophageal junction were completely normal in appearance.  The stomach was entered and closely examined.The antrum, angularis, and lesser curvature were well visualized, including a retroflexed view of the cardia and fundus. The stomach wall was normally distensible.  The scope passed easily through the pylorus into the duodenum.   Cold forcep biopsies were taken in the gastric body, antrum and incisura to evaluate for h. pylori.  Retroflexed views revealed no abnormalities.     The scope was then withdrawn from the patient and the procedure completed.  COMPLICATIONS: There were no immediate complications.  ENDOSCOPIC IMPRESSION: Normal upper endoscopy; biopsies performed to exclude H. pylori  RECOMMENDATIONS: 1.  Await biopsy results 2.  Continue taking your PPI (pantoprazole 40 mg) once daily.  It is best to be taken 20-30 minutes prior to breakfast meal. 3.  Office follow-up next available  eSigned:  Beverley Fiedler, MD 03/28/2015 4:02 PM    [id number] Sherwood Gambler, MD and The Patient

## 2015-03-29 ENCOUNTER — Telehealth: Payer: Self-pay | Admitting: Emergency Medicine

## 2015-03-29 NOTE — Telephone Encounter (Signed)
  Follow up Call-  Call back number 03/28/2015  Post procedure Call Back phone  # [phone]  Permission to leave phone message Yes     Patient questions:  Do you have a fever, pain , or abdominal swelling? No. Pain Score  0 *  Have you tolerated food without any problems? Yes.    Have you been able to return to your normal activities? Yes.    Do you have any questions about your discharge instructions: Diet   No. Medications  No. Follow up visit  No.  Do you have questions or concerns about your Care? No.  Actions: * If pain score is 4 or above: No action needed, pain <4.

## 2015-04-07 ENCOUNTER — Encounter: Payer: Self-pay | Admitting: Internal Medicine

## 2015-05-06 ENCOUNTER — Encounter: Payer: Self-pay | Admitting: *Deleted

## 2015-05-12 ENCOUNTER — Ambulatory Visit: Payer: 59 | Admitting: Internal Medicine

## 2015-07-13 ENCOUNTER — Encounter: Payer: Self-pay | Admitting: Internal Medicine

## 2015-07-13 ENCOUNTER — Ambulatory Visit (INDEPENDENT_AMBULATORY_CARE_PROVIDER_SITE_OTHER): Payer: Commercial Managed Care - HMO | Admitting: Internal Medicine

## 2015-07-13 VITALS — BP 110/76 | HR 88 | Ht 65.25 in | Wt 221.0 lb

## 2015-07-13 DIAGNOSIS — K219 Gastro-esophageal reflux disease without esophagitis: Secondary | ICD-10-CM | POA: Diagnosis not present

## 2015-07-13 DIAGNOSIS — K5900 Constipation, unspecified: Secondary | ICD-10-CM

## 2015-07-13 MED ORDER — DEXLANSOPRAZOLE 60 MG PO CPDR: 60.0000 mg | DELAYED_RELEASE_CAPSULE | Freq: Every day | ORAL | Status: DC

## 2015-07-13 NOTE — Progress Notes (Signed)
   Subjective:    Patient ID: TREMAYNE WERGIN, female    DOB: 03/16/1996, 19 y.o.   MRN: [id number]  HPI Emya is a 19 year old female who is seen again for GERD and also constipation. She was last seen on 03/24/2015 which time she was having uncontrolled heartburn. We performed upper endoscopy on 03/28/2015 which was normal. Biopsies of the stomach were negative for H. pylori. She was treated with pantoprazole 40 mg daily and ranitidine 150 mg at night as needed. She returns stating she continues to have issues with heartburn. This is driven by diet but can be associated with traditional heartburn and also nausea. She is taking pantoprazole 40 mg daily as prescribed. Trying to follow a GERD diet. Is having ongoing issues with constipation got no benefit from the addition of Benefiber. When she is more constipated her heartburn seems worse. No dysphagia or odynophagia. No nausea or vomiting. No blood in her stool or melena.  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     Objective:   Physical Exam BP 110/76 mmHg  Pulse 88  Ht 5' 5.25" (1.657 m)  Wt 221 lb (100.245 kg)  BMI 36.51 kg/m2  LMP 06/17/2015 Constitutional: Well-developed and well-nourished. No distress. HEENT: Normocephalic and atraumatic.Conjunctivae are normal.  No scleral icterus. Neck: Neck supple. Trachea midline. Cardiovascular: Normal rate, regular rhythm and intact distal pulses. No M/R/G Pulmonary/chest: Effort normal and breath sounds normal. No wheezing, rales or rhonchi. Abdominal: Soft, nontender, nondistended. Bowel sounds active throughout.  Extremities: no clubbing, cyanosis, or  edema Lymphadenopathy: No cervical adenopathy noted. Neurological: Alert and oriented to person place and time. Skin: Skin is warm and dry. No rashes noted. Psychiatric: Normal mood and affect. Behavior is normal.  EGD and path reviewed with her today     Assessment & Plan:  19 year old female who is seen again for GERD and also constipation.   1. GERD -- suboptimal control with pantoprazole 40 mg once daily and ranitidine at night. She is not using ranitidine every day however. Given lack of response will switch her to Dexilant 60 mg daily. I again stressed the important of a GERD diet, avoiding eating within 2 hours of lying down, avoiding spicy foods, acidic foods. She can continue ranitidine on an as-needed basis for breakthrough symptoms at night or in the evening.  2. Constipation -- no real improvement with Benefiber. Begin MiraLAX 17 g daily. His stools are too loose she is asked to take it every other day. She is reminded she will need MiraLAX on a scheduled basis to see benefit.  If symptoms improve follow-up in one year, if not 2-3 months

## 2015-07-13 NOTE — Patient Instructions (Signed)
We have sent the following medications to your pharmacy for you to pick up at your convenience: Dexilant 60 mg daily  Please purchase the following medications over the counter and take as directed: Miralax 17 grams daily  Please call our office if symptoms do not improve.  Please follow up with Dr Rhea Belton in 1 year.

## 2015-11-18 ENCOUNTER — Telehealth: Payer: Self-pay | Admitting: Internal Medicine

## 2015-11-18 NOTE — Telephone Encounter (Signed)
Pt states she has been on dexilant since October and for the past several weeks she has been having abdominal pain, nausea, and diarrhea. Pt wonders if this is a side effect from the dexilant. Please advise.

## 2015-11-18 NOTE — Telephone Encounter (Signed)
It could be would have her hold the medication for 5-7 days If symptoms continue she should be seen by me or APP Once better, can try Dexilant again and if symptoms recur, then we will have link and permanently d/c the med

## 2015-11-18 NOTE — Telephone Encounter (Signed)
Spoke with pt and she is aware.

## 2015-11-23 ENCOUNTER — Telehealth: Payer: Self-pay | Admitting: Physician Assistant

## 2015-11-23 ENCOUNTER — Encounter: Payer: Self-pay | Admitting: Physician Assistant

## 2015-11-23 ENCOUNTER — Ambulatory Visit (INDEPENDENT_AMBULATORY_CARE_PROVIDER_SITE_OTHER): Payer: Commercial Managed Care - HMO | Admitting: Physician Assistant

## 2015-11-23 ENCOUNTER — Other Ambulatory Visit (INDEPENDENT_AMBULATORY_CARE_PROVIDER_SITE_OTHER): Payer: Commercial Managed Care - HMO

## 2015-11-23 ENCOUNTER — Other Ambulatory Visit: Payer: Self-pay | Admitting: Emergency Medicine

## 2015-11-23 VITALS — BP 118/70 | HR 96 | Ht 65.25 in | Wt 211.1 lb

## 2015-11-23 DIAGNOSIS — K219 Gastro-esophageal reflux disease without esophagitis: Secondary | ICD-10-CM | POA: Diagnosis not present

## 2015-11-23 DIAGNOSIS — R110 Nausea: Secondary | ICD-10-CM | POA: Diagnosis not present

## 2015-11-23 DIAGNOSIS — G8929 Other chronic pain: Secondary | ICD-10-CM

## 2015-11-23 DIAGNOSIS — R1013 Epigastric pain: Secondary | ICD-10-CM

## 2015-11-23 DIAGNOSIS — K589 Irritable bowel syndrome without diarrhea: Secondary | ICD-10-CM | POA: Diagnosis not present

## 2015-11-23 LAB — LIPASE: LIPASE: 27 U/L (ref 11.0–59.0)

## 2015-11-23 LAB — CBC WITH DIFFERENTIAL/PLATELET
BASOS ABS: 0 10*3/uL (ref 0.0–0.1)
Basophils Relative: 0.4 % (ref 0.0–3.0)
Eosinophils Absolute: 0.1 10*3/uL (ref 0.0–0.7)
Eosinophils Relative: 1 % (ref 0.0–5.0)
HEMATOCRIT: 38.8 % (ref 36.0–46.0)
HEMOGLOBIN: 13.4 g/dL (ref 12.0–15.0)
LYMPHS PCT: 22.4 % (ref 12.0–46.0)
Lymphs Abs: 1.3 10*3/uL (ref 0.7–4.0)
MCHC: 34.5 g/dL (ref 30.0–36.0)
MCV: 77.4 fl — AB (ref 78.0–100.0)
MONOS PCT: 4.4 % (ref 3.0–12.0)
Monocytes Absolute: 0.3 10*3/uL (ref 0.1–1.0)
Neutro Abs: 4.1 10*3/uL (ref 1.4–7.7)
Neutrophils Relative %: 71.8 % (ref 43.0–77.0)
PLATELETS: 378 10*3/uL (ref 150.0–400.0)
RBC: 5.01 Mil/uL (ref 3.87–5.11)
RDW: 14.2 % (ref 11.5–15.5)
WBC: 5.8 10*3/uL (ref 4.0–10.5)

## 2015-11-23 LAB — HEPATIC FUNCTION PANEL
ALBUMIN: 4.3 g/dL (ref 3.5–5.2)
ALT: 15 U/L (ref 0–35)
AST: 17 U/L (ref 0–37)
Alkaline Phosphatase: 84 U/L (ref 39–117)
BILIRUBIN TOTAL: 0.4 mg/dL (ref 0.2–1.2)
Bilirubin, Direct: 0.1 mg/dL (ref 0.0–0.3)
Total Protein: 8.1 g/dL (ref 6.0–8.3)

## 2015-11-23 MED ORDER — RABEPRAZOLE SODIUM 20 MG PO TBEC: 20.0000 mg | DELAYED_RELEASE_TABLET | ORAL | Status: DC

## 2015-11-23 MED ORDER — HYOSCYAMINE SULFATE 0.125 MG SL SUBL: 0.1250 mg | SUBLINGUAL_TABLET | Freq: Three times a day (TID) | SUBLINGUAL | Status: DC | PRN

## 2015-11-23 NOTE — Telephone Encounter (Signed)
Rx sent to pharmacy. Pt informed.  

## 2015-11-23 NOTE — Patient Instructions (Addendum)
Your physician has requested that you go to the basement for lab work before leaving today   You have been scheduled for an abdominal ultrasound at [facility] (1st floor) on 12-01-15 at 10:30 am. Please arrive 15 minutes prior to your appointment for registration. Make certain not to have anything to eat or drink 6 hours prior to your appointment. Should you need to reschedule your appointment, please contact radiology at [phone]. This test typically takes about 30 minutes to perform.  We have sent the following medications to your pharmacy for you to pick up at your convenience: Aciphex 20 mg every morning at bedtime Levsin 0.125 mg every 8 hours as needed for cramps  Use your Pepcid at bedtime  We have given you a handout on antireflux.  PLease keep your follow up with Dr. Rhea Belton.

## 2015-11-24 ENCOUNTER — Other Ambulatory Visit: Payer: Self-pay | Admitting: *Deleted

## 2015-11-24 ENCOUNTER — Encounter: Payer: Self-pay | Admitting: Physician Assistant

## 2015-11-24 ENCOUNTER — Telehealth: Payer: Self-pay | Admitting: Physician Assistant

## 2015-11-24 MED ORDER — RANITIDINE HCL 150 MG PO TABS: 150.0000 mg | ORAL_TABLET | Freq: Two times a day (BID) | ORAL | Status: DC

## 2015-11-24 NOTE — Progress Notes (Addendum)
Patient ID: Bethany Barrera, female   DOB: [birth year], 20 y.o.   MRN: [id number]     History of Present Illness: Bethany Barrera  Is a 20 year old female known to Dr. Rhea Belton. She has a history of GERD and constipation. She was last seen in October which time she was prescribed dexilant and Mira lax. She returns today with complaints of ongoing abdominal pain with lots of heartburn. She states she feels that the Dexilant  Caused abdominal pain so she only used it once or twice. She's had terrible heartburn for the last month and has used Pepcid for a few days with no relief. She had used Nexium in the past but feels it lost its effect. She also reports she's been having intermittent diarrhea. She has 2 bowel movements daily with no blood. She has no oily stools. She complains of abdominal gurgling and bloating with gas area she is nauseous and states she feels full after 2 or 3 bites. Her weight has been stable. She reports that her mom and sister had gallbladder problems and she would like an ultrasound to be sure she doesn't have gallstones. She has also been having intermittent dysphagia to solids though not daily.   Past Medical History  Diagnosis Date  . GERD (gastroesophageal reflux disease)   . Anxiety   . Allergy   . Gastritis     History reviewed. No pertinent past surgical history. Family History  Problem Relation Age of Onset  . Clotting disorder Maternal Grandfather   . Diabetes Maternal Grandfather   . Heart disease Maternal Grandfather   . Breast cancer Paternal Grandmother   . Diabetes Mother   . Other Mother     gallstones  . Other Sister     pre-diabetes  . Other Sister     gallstones   Social History  Substance Use Topics  . Smoking status: Never Smoker   . Smokeless tobacco: Never Used  . Alcohol Use: No   Current Outpatient Prescriptions  Medication Sig Dispense Refill  . famotidine (PEPCID AC) 10 MG chewable tablet Chew 10 mg by mouth 2 (two) times  daily.    . fluticasone (FLONASE ALLERGY RELIEF) 50 MCG/ACT nasal spray Place into both nostrils daily.    Marland Kitchen MONO-LINYAH 0.25-35 MG-MCG tablet Take 1 tablet by mouth daily.    . hyoscyamine (LEVSIN SL) 0.125 MG SL tablet Place 1 tablet (0.125 mg total) under the tongue every 8 (eight) hours as needed for cramping. 60 tablet 2  . ranitidine (ZANTAC) 150 MG tablet Take 1 tablet (150 mg total) by mouth 2 (two) times daily. 60 tablet 2   No current facility-administered medications for this visit.   Allergies  Allergen Reactions  . Sulfa Antibiotics Shortness Of Breath and Other (See Comments)    tremors  . Tramadol Other (See Comments)    headaches     Review of Systems:  per history of present illness otherwise negative.      Physical Exam: BP 118/70 mmHg  Pulse 96  Ht 5' 5.25" (1.657 m)  Wt 211 lb 2 oz (95.766 kg)  BMI 34.88 kg/m2  LMP 10/21/2015 (Exact Date) General: Pleasant, well developed , female in no acute distress Head: Normocephalic and atraumatic Eyes:  sclerae anicteric, conjunctiva pink  Ears: Normal auditory acuity Lungs: Clear throughout to auscultation Heart: Regular rate and rhythm Abdomen: Soft, non distended, non-tender. No masses, no hepatomegaly. Normal bowel sounds Musculoskeletal: Symmetrical with no gross deformities  Extremities: No edema  Neurological: Alert oriented x 4, grossly nonfocal Psychological:  Alert and cooperative. Normal mood and affect  Assessment and Recommendations:  #1 GERD. Antireflux regimen has been reviewed. She will be given a trial of AcipHex 20 mg 1 by mouth every morning. She will use Pepcid over-the-counter at bedtime.    #2. Erratic bowel movements. Describes intermittent loose stools and later states she has days of constipation. She's been instructed to adhere to a high-fiber, low-fat diet. She will try Levsin 0.125 mg 1 by mouth every 8 hours when necessary pain. A CBC will be obtained.    #3. Epigastric pain. This  is likely due to uncontrolled GERD. However an abdominal ultrasound will be obtained to evaluate for cholelithiasis. Hepatic function panel and lipase will be obtained.     She will follow up in 2 months, sooner if needed   Arma Reining, Tollie Pizza PA-C 11/24/2015,  Addendum: Reviewed and agree with management. Beverley Fiedler, MD

## 2015-11-24 NOTE — Telephone Encounter (Signed)
Rx sent to pharmacy. Patient notified. 

## 2015-11-24 NOTE — Telephone Encounter (Signed)
Spoke with patient and she states she took Aciphex last night and felt like she could not swallow, had jerky arms and felt nervous. Patient instructed not to take any more Aciphex. Please, advise.

## 2015-11-24 NOTE — Telephone Encounter (Signed)
Try ranitidine 150 mg bid.

## 2015-12-01 ENCOUNTER — Ambulatory Visit
Admission: RE | Admit: 2015-12-01 | Discharge: 2015-12-01 | Disposition: A | Discharge status: Home / Self Care | Payer: Commercial Managed Care - HMO | Source: Ambulatory Visit | Attending: Physician Assistant | Admitting: Physician Assistant

## 2015-12-01 DIAGNOSIS — R1013 Epigastric pain: Secondary | ICD-10-CM

## 2015-12-01 DIAGNOSIS — G8929 Other chronic pain: Secondary | ICD-10-CM

## 2015-12-01 DIAGNOSIS — R110 Nausea: Secondary | ICD-10-CM

## 2015-12-01 DIAGNOSIS — K219 Gastro-esophageal reflux disease without esophagitis: Secondary | ICD-10-CM

## 2015-12-01 DIAGNOSIS — K589 Irritable bowel syndrome without diarrhea: Secondary | ICD-10-CM

## 2015-12-08 ENCOUNTER — Encounter (HOSPITAL_COMMUNITY): Payer: Self-pay | Admitting: Emergency Medicine

## 2015-12-08 ENCOUNTER — Emergency Department (HOSPITAL_COMMUNITY)
Admission: EM | Admit: 2015-12-08 | Discharge: 2015-12-08 | Disposition: A | Discharge status: Home / Self Care | Payer: Commercial Managed Care - HMO | Attending: Emergency Medicine | Admitting: Emergency Medicine

## 2015-12-08 ENCOUNTER — Emergency Department (HOSPITAL_COMMUNITY): Payer: Commercial Managed Care - HMO

## 2015-12-08 DIAGNOSIS — R253 Fasciculation: Secondary | ICD-10-CM | POA: Diagnosis not present

## 2015-12-08 DIAGNOSIS — M791 Myalgia, unspecified site: Secondary | ICD-10-CM

## 2015-12-08 DIAGNOSIS — Z8659 Personal history of other mental and behavioral disorders: Secondary | ICD-10-CM | POA: Diagnosis not present

## 2015-12-08 DIAGNOSIS — Z79899 Other long term (current) drug therapy: Secondary | ICD-10-CM | POA: Diagnosis not present

## 2015-12-08 DIAGNOSIS — N644 Mastodynia: Secondary | ICD-10-CM | POA: Insufficient documentation

## 2015-12-08 DIAGNOSIS — M549 Dorsalgia, unspecified: Secondary | ICD-10-CM | POA: Insufficient documentation

## 2015-12-08 DIAGNOSIS — R079 Chest pain, unspecified: Secondary | ICD-10-CM | POA: Insufficient documentation

## 2015-12-08 DIAGNOSIS — R1310 Dysphagia, unspecified: Secondary | ICD-10-CM | POA: Diagnosis not present

## 2015-12-08 DIAGNOSIS — J029 Acute pharyngitis, unspecified: Secondary | ICD-10-CM | POA: Diagnosis not present

## 2015-12-08 DIAGNOSIS — R1013 Epigastric pain: Secondary | ICD-10-CM | POA: Insufficient documentation

## 2015-12-08 DIAGNOSIS — R630 Anorexia: Secondary | ICD-10-CM | POA: Diagnosis not present

## 2015-12-08 DIAGNOSIS — K219 Gastro-esophageal reflux disease without esophagitis: Secondary | ICD-10-CM | POA: Insufficient documentation

## 2015-12-08 DIAGNOSIS — Z7951 Long term (current) use of inhaled steroids: Secondary | ICD-10-CM | POA: Insufficient documentation

## 2015-12-08 DIAGNOSIS — M62838 Other muscle spasm: Secondary | ICD-10-CM

## 2015-12-08 LAB — I-STAT CHEM 8, ED
BUN: 5 mg/dL — AB (ref 6–20)
CHLORIDE: 105 mmol/L (ref 101–111)
CREATININE: 0.7 mg/dL (ref 0.44–1.00)
Calcium, Ion: 1.1 mmol/L — ABNORMAL LOW (ref 1.12–1.23)
Glucose, Bld: 83 mg/dL (ref 65–99)
HEMATOCRIT: 44 % (ref 36.0–46.0)
Hemoglobin: 15 g/dL (ref 12.0–15.0)
Potassium: 3.6 mmol/L (ref 3.5–5.1)
Sodium: 141 mmol/L (ref 135–145)
TCO2: 22 mmol/L (ref 0–100)

## 2015-12-08 MED ORDER — GI COCKTAIL ~~LOC~~
30.0000 mL | Freq: Once | ORAL | Status: AC
  Administered 2015-12-08: 30 mL via ORAL
  Filled 2015-12-08: qty 30

## 2015-12-08 MED ORDER — SUCRALFATE 1 G PO TABS: 1.0000 g | ORAL_TABLET | Freq: Three times a day (TID) | ORAL | Status: DC

## 2015-12-08 MED ORDER — CYCLOBENZAPRINE HCL 10 MG PO TABS: 10.0000 mg | ORAL_TABLET | Freq: Two times a day (BID) | ORAL | Status: DC | PRN

## 2015-12-08 NOTE — ED Provider Notes (Signed)
CSN: [id number]     Arrival date & time 12/08/15  1053 History  By signing my name below, I, Bethany Barrera, attest that this documentation has been prepared under the direction and in the presence of non-physician practitioner, Danelle Berry, PA-C. Electronically Signed: Freida Barrera, Scribe. 12/08/2015. 3:42 PM.     Chief Complaint  Patient presents with  . Sore Throat  . Generalized Body Aches   The history is provided by the patient. No language interpreter was used.    HPI Comments:  Bethany Barrera is a 20 y.o. female who presents to the Emergency Department complaining of sore throat with associated generalized body aches x ~ 1 week. Pt notes specific pain to her central back, central chest and under bilateral breasts which she rates a 7/10 and describes as sharp. She also describes spasms and twitches in her BUE and BLE. She notes mild exacerbation of pain with twisting movements. She also notes decreased appetite but strates she has been experiencing that for ~ 1.5 weeks. She was evaluated by her PCP for her symptom and placed on Aciphex for GERD. Pt states her symptoms began after her first dose. She then called her PCP and was switched to Zantac.  Pt denies rash, cough, congestion, fever, chills, oral/facial swelling. No alleviating factors noted.    Past Medical History  Diagnosis Date  . GERD (gastroesophageal reflux disease)   . Anxiety   . Allergy   . Gastritis    History reviewed. No pertinent past surgical history. Family History  Problem Relation Age of Onset  . Clotting disorder Maternal Grandfather   . Diabetes Maternal Grandfather   . Heart disease Maternal Grandfather   . Breast cancer Paternal Grandmother   . Diabetes Mother   . Other Mother     gallstones  . Other Sister     pre-diabetes  . Other Sister     gallstones   Social History  Substance Use Topics  . Smoking status: Never Smoker   . Smokeless tobacco: Never Used  . Alcohol Use: No   OB  History    No data available     Review of Systems  Constitutional: Positive for appetite change. Negative for fever and chills.  HENT: Positive for sore throat. Negative for congestion, facial swelling and trouble swallowing.   Respiratory: Negative for cough.   Cardiovascular: Positive for chest pain.  Musculoskeletal: Positive for myalgias and back pain.  Skin: Negative for rash.  All other systems reviewed and are negative.  Allergies  Sulfa antibiotics and Tramadol  Home Medications   Prior to Admission medications   Medication Sig Start Date End Date Taking? Authorizing Provider  cyclobenzaprine (FLEXERIL) 10 MG tablet Take 1 tablet (10 mg total) by mouth 2 (two) times daily as needed for muscle spasms. 12/08/15   Danelle Berry, PA-C  famotidine (PEPCID AC) 10 MG chewable tablet Chew 10 mg by mouth 2 (two) times daily.    Historical Provider, MD  fluticasone [facility] ALLERGY RELIEF) 50 MCG/ACT nasal spray Place into both nostrils daily.    Historical Provider, MD  hyoscyamine (LEVSIN SL) 0.125 MG SL tablet Place 1 tablet (0.125 mg total) under the tongue every 8 (eight) hours as needed for cramping. 11/23/15   Lori P Hvozdovic, PA-C  MONO-LINYAH 0.25-35 MG-MCG tablet Take 1 tablet by mouth daily. 03/01/15   Historical Provider, MD  ranitidine (ZANTAC) 150 MG tablet Take 1 tablet (150 mg total) by mouth 2 (two) times daily. 11/24/15  Lori P Hvozdovic, PA-C  sucralfate (CARAFATE) 1 g tablet Take 1 tablet (1 g total) by mouth 4 (four) times daily -  with meals and at bedtime. 12/08/15   Danelle Berry, PA-C   BP 122/80 mmHg  Pulse 74  Temp(Src) 98.5 F (36.9 C) (Oral)  Resp 16  Ht 5\' 5"  (1.651 m)  Wt 215 lb (97.523 kg)  BMI 35.78 kg/m2  SpO2 100%  LMP 12/01/2015 Physical Exam  Constitutional: She is oriented to person, place, and time. She appears well-developed and well-nourished. No distress.  HENT:  Head: Normocephalic and atraumatic.  Right Ear: External ear normal.  Left Ear:  External ear normal.  Nose: Nose normal.  Mouth/Throat: Oropharynx is clear and moist. No oropharyngeal exudate.  No chvostek sign Bilateral TM's normal   Eyes: Conjunctivae and EOM are normal. Pupils are equal, round, and reactive to light. Right eye exhibits no discharge. Left eye exhibits no discharge. No scleral icterus.  Neck: Normal range of motion. Neck supple. No JVD present. No tracheal deviation present.  Cardiovascular: Normal rate, regular rhythm, normal heart sounds and intact distal pulses.  Exam reveals no gallop and no friction rub.   No murmur heard. Pulmonary/Chest: Effort normal and breath sounds normal. No stridor. No respiratory distress. She has no wheezes. She has no rales. She exhibits no tenderness.  Abdominal: Soft. Bowel sounds are normal. She exhibits no distension and no mass. There is no rebound and no guarding.  Mild epigastric ttp  Musculoskeletal: Normal range of motion. She exhibits tenderness. She exhibits no edema.  ttp to all extremities and muscle groups, with soft compartments, no erythema, no edema  Lymphadenopathy:    She has no cervical adenopathy.  Neurological: She is alert and oriented to person, place, and time. She exhibits normal muscle tone. Coordination normal.  Skin: Skin is warm and dry. No rash noted. She is not diaphoretic. No erythema. No pallor.  No rash  Psychiatric: She has a normal mood and affect. Her behavior is normal. Judgment and thought content normal.  Nursing note and vitals reviewed.   ED Course  Procedures   DIAGNOSTIC STUDIES:  Oxygen Saturation is 97% on RA, normal by my interpretation.    COORDINATION OF CARE:  12:36 PM Discussed treatment plan with pt at bedside and pt agreed to plan.  Labs Review Labs Reviewed  I-STAT CHEM 8, ED - Abnormal; Notable for the following:    BUN 5 (*)    Calcium, Ion 1.10 (*)    All other components within normal limits    Imaging Review Dg Abd Acute W/chest  12/08/2015   CLINICAL DATA:  Medial chest pain. Shortness of breath. Diarrhea. Nausea. Epigastric pain. EXAM: DG ABDOMEN ACUTE W/ 1V CHEST COMPARISON:  Multiple exams, including 12/22/2008 and 08/11/2006 FINDINGS: The lungs appear clear.  Cardiac and mediastinal contours normal. No pleural effusion identified. No free intraperitoneal gas evident beneath the hemidiaphragms. No abnormal air-fluid levels. No significant abnormal calcifications or dilated bowel observed. IMPRESSION: 1. No significant abnormality identified. Normal bowel gas pattern. Electronically Signed   By: Gaylyn Rong M.D.   On: 12/08/2015 13:15   I have personally reviewed and evaluated these images and lab results as part of my medical decision-making.    MDM   Pt with multiple complaints including sore throat, dysphagia (which is chronic), myalgias, muscle twitches and spasms.   Pt has been worked up by GI extensively for GERD, IBS and dysphagia, I have reviewed GI recent visits and  pt has chronic issues consistent with some of her complaints today, specifically dysphagia, sore throat and epigastric tenderness.  No acute issues.  Pt was encouraged to return to GI.  Plain films of chest and abdomen negative. Unclear etiology of pt's vague myalgia sx.  She does not appear to have any associated viral illness, do not suspect myositis, rhabdo.  The pt has no weakness, no rash.  She is ambulatory with normal gait.  I have not observed any muscle twitches or spasms.  Basic labs obtained were normal. She was given flexeril Rx to try for her MSK complaints.  Feel she is safe to d/c home.  She is well appearing, VSS, labs normal.  She was encouraged to see her PCP and GI for follow up.  Final diagnoses:  Myalgia  Muscle spasm  Epigastric pain    I personally performed the services described in this documentation, which was scribed in my presence. The recorded information has been reviewed and is accurate.     Danelle Berry, PA-C 12/11/15  1610  Gwyneth Sprout, MD 12/11/15 2108

## 2015-12-08 NOTE — ED Notes (Signed)
Pt states she started taking a new medicine fopr acid reflex and since then she states she has felt achy and and having a sore throat. Pt denies any cough, abd pain or vomiting.

## 2015-12-08 NOTE — Discharge Instructions (Signed)
Muscle Cramps and Spasms Muscle cramps and spasms occur when a muscle or muscles tighten and you have no control over this tightening (involuntary muscle contraction). They are a common problem and can develop in any muscle. The most common place is in the calf muscles of the leg. Both muscle cramps and muscle spasms are involuntary muscle contractions, but they also have differences:   Muscle cramps are sporadic and painful. They may last a few seconds to a quarter of an hour. Muscle cramps are often more forceful and last longer than muscle spasms.  Muscle spasms may or may not be painful. They may also last just a few seconds or much longer. CAUSES  It is uncommon for cramps or spasms to be due to a serious underlying problem. In many cases, the cause of cramps or spasms is unknown. Some common causes are:   Overexertion.   Overuse from repetitive motions (doing the same thing over and over).   Remaining in a certain position for a long period of time.   Improper preparation, form, or technique while performing a sport or activity.   Dehydration.   Injury.   Side effects of some medicines.   Abnormally low levels of the salts and ions in your blood (electrolytes), especially potassium and calcium. This could happen if you are taking water pills (diuretics) or you are pregnant.  Some underlying medical problems can make it more likely to develop cramps or spasms. These include, but are not limited to:   Diabetes.   Parkinson disease.   Hormone disorders, such as thyroid problems.   Alcohol abuse.   Diseases specific to muscles, joints, and bones.   Blood vessel disease where not enough blood is getting to the muscles.  HOME CARE INSTRUCTIONS   Stay well hydrated. Drink enough water and fluids to keep your urine clear or pale yellow.  It may be helpful to massage, stretch, and relax the affected muscle.  For tight or tense muscles, use a warm towel, heating  pad, or hot shower water directed to the affected area.  If you are sore or have pain after a cramp or spasm, applying ice to the affected area may relieve discomfort.  Put ice in a plastic bag.  Place a towel between your skin and the bag.  Leave the ice on for 15-20 minutes, 03-04 times a day.  Medicines used to treat a known cause of cramps or spasms may help reduce their frequency or severity. Only take over-the-counter or prescription medicines as directed by your caregiver. SEEK MEDICAL CARE IF:  Your cramps or spasms get more severe, more frequent, or do not improve over time.  MAKE SURE YOU:   Understand these instructions.  Will watch your condition.  Will get help right away if you are not doing well or get worse.   This information is not intended to replace advice given to you by your health care provider. Make sure you discuss any questions you have with your health care provider.   Document Released: 03/16/2002 Document Revised: 01/19/2013 Document Reviewed: 09/10/2012 Elsevier Interactive Patient Education 2016 Else[employer] Inc[employer]  Muscle Pain, Adult Muscle pain (myalgia) may be caused by many things, including:  Overuse or muscle strain, especially if you are not in shape. This is the most common cause of muscle pain.  Injury.  Bruises.  Viruses, such as the flu.  Infectious diseases.  Fibromyalgia, which is a chronic condition that causes muscle tenderness, fatigue, and headache.  Autoimmune diseases,  including lupus.  Certain drugs, including ACE inhibitors and statins. Muscle pain may be mild or severe. In most cases, the pain lasts only a short time and goes away without treatment. To diagnose the cause of your muscle pain, your health care provider will take your medical history. This means he or she will ask you when your muscle pain began and what has been happening. If you have not had muscle pain for very long, your health care provider may want to wait  before doing much testing. If your muscle pain has lasted a long time, your health care provider may want to run tests right away. If your health care provider thinks your muscle pain may be caused by illness, you may need to have additional tests to rule out certain conditions.  Treatment for muscle pain depends on the cause. Home care is often enough to relieve muscle pain. Your health care provider may also prescribe anti-inflammatory medicine. HOME CARE INSTRUCTIONS Watch your condition for any changes. The following actions may help to lessen any discomfort you are feeling:  Only take over-the-counter or prescription medicines as directed by your health care provider.  Apply ice to the sore muscle:  Put ice in a plastic bag.  Place a towel between your skin and the bag.  Leave the ice on for 15-20 minutes, 3-4 times a day.  You may alternate applying hot and cold packs to the muscle as directed by your health care provider.  If overuse is causing your muscle pain, slow down your activities until the pain goes away.  Remember that it is normal to feel some muscle pain after starting a workout program. Muscles that have not been used often will be sore at first.  Do regular, gentle exercises if you are not usually active.  Warm up before exercising to lower your risk of muscle pain.  Do not continue working out if the pain is very bad. Bad pain could mean you have injured a muscle. SEEK MEDICAL CARE IF:  Your muscle pain gets worse, and medicines do not help.  You have muscle pain that lasts longer than 3 days.  You have a rash or fever along with muscle pain.  You have muscle pain after a tick bite.  You have muscle pain while working out, even though you are in good physical condition.  You have redness, soreness, or swelling along with muscle pain.  You have muscle pain after starting a new medicine or changing the dose of a medicine. SEEK IMMEDIATE MEDICAL CARE  IF:  You have trouble breathing.  You have trouble swallowing.  You have muscle pain along with a stiff neck, fever, and vomiting.  You have severe muscle weakness or cannot move part of your body. MAKE SURE YOU:   Understand these instructions.  Will watch your condition.  Will get help right away if you are not doing well or get worse.   This information is not intended to replace advice given to you by your health care provider. Make sure you discuss any questions you have with your health care provider.   Document Released: 08/16/2006 Document Revised: 10/15/2014 Document Reviewed: 07/21/2013 Elsevier Interactive Patient Education 2016 Elsevier Inc.  Abdominal Pain, Adult Many things can cause abdominal pain. Usually, abdominal pain is not caused by a disease and will improve without treatment. It can often be observed and treated at home. Your health care provider will do a physical exam and possibly order blood tests and X-rays  to help determine the seriousness of your pain. However, in many cases, more time must pass before a clear cause of the pain can be found. Before that point, your health care provider may not know if you need more testing or further treatment. HOME CARE INSTRUCTIONS Monitor your abdominal pain for any changes. The following actions may help to alleviate any discomfort you are experiencing:  Only take over-the-counter or prescription medicines as directed by your health care provider.  Do not take laxatives unless directed to do so by your health care provider.  Try a clear liquid diet (broth, tea, or water) as directed by your health care provider. Slowly move to a bland diet as tolerated. SEEK MEDICAL CARE IF:  You have unexplained abdominal pain.  You have abdominal pain associated with nausea or diarrhea.  You have pain when you urinate or have a bowel movement.  You experience abdominal pain that wakes you in the night.  You have abdominal  pain that is worsened or improved by eating food.  You have abdominal pain that is worsened with eating fatty foods.  You have a fever. SEEK IMMEDIATE MEDICAL CARE IF:  Your pain does not go away within 2 hours.  You keep throwing up (vomiting).  Your pain is felt only in portions of the abdomen, such as the right side or the left lower portion of the abdomen.  You pass bloody or black tarry stools. MAKE SURE YOU:  Understand these instructions.  Will watch your condition.  Will get help right away if you are not doing well or get worse.   This information is not intended to replace advice given to you by your health care provider. Make sure you discuss any questions you have with your health care provider.   Document Released: 07/04/2005 Document Revised: 06/15/2015 Document Reviewed: 06/03/2013 Elsevier Interactive Patient Education [employer]

## 2015-12-08 NOTE — ED Notes (Signed)
PT refusing blood draw from Nursing staff. Pt request Lab Tech. Draw only.

## 2015-12-15 ENCOUNTER — Encounter (HOSPITAL_COMMUNITY): Payer: Self-pay

## 2015-12-15 DIAGNOSIS — R42 Dizziness and giddiness: Secondary | ICD-10-CM | POA: Diagnosis not present

## 2015-12-15 DIAGNOSIS — K219 Gastro-esophageal reflux disease without esophagitis: Secondary | ICD-10-CM | POA: Insufficient documentation

## 2015-12-15 DIAGNOSIS — Z8659 Personal history of other mental and behavioral disorders: Secondary | ICD-10-CM | POA: Insufficient documentation

## 2015-12-15 DIAGNOSIS — Z79899 Other long term (current) drug therapy: Secondary | ICD-10-CM | POA: Diagnosis not present

## 2015-12-15 DIAGNOSIS — R079 Chest pain, unspecified: Secondary | ICD-10-CM | POA: Insufficient documentation

## 2015-12-15 DIAGNOSIS — B349 Viral infection, unspecified: Secondary | ICD-10-CM | POA: Insufficient documentation

## 2015-12-15 DIAGNOSIS — K2970 Gastritis, unspecified, without bleeding: Secondary | ICD-10-CM | POA: Insufficient documentation

## 2015-12-15 DIAGNOSIS — Z87440 Personal history of urinary (tract) infections: Secondary | ICD-10-CM | POA: Insufficient documentation

## 2015-12-15 DIAGNOSIS — Z3202 Encounter for pregnancy test, result negative: Secondary | ICD-10-CM | POA: Insufficient documentation

## 2015-12-15 DIAGNOSIS — Z7951 Long term (current) use of inhaled steroids: Secondary | ICD-10-CM | POA: Diagnosis not present

## 2015-12-15 DIAGNOSIS — M791 Myalgia: Secondary | ICD-10-CM | POA: Diagnosis present

## 2015-12-15 LAB — COMPREHENSIVE METABOLIC PANEL
ALK PHOS: 82 U/L (ref 38–126)
ALT: 10 U/L — AB (ref 14–54)
AST: 18 U/L (ref 15–41)
Albumin: 4.1 g/dL (ref 3.5–5.0)
Anion gap: 12 (ref 5–15)
BILIRUBIN TOTAL: 0.4 mg/dL (ref 0.3–1.2)
BUN: 6 mg/dL (ref 6–20)
CALCIUM: 9.5 mg/dL (ref 8.9–10.3)
CO2: 21 mmol/L — ABNORMAL LOW (ref 22–32)
CREATININE: 0.87 mg/dL (ref 0.44–1.00)
Chloride: 104 mmol/L (ref 101–111)
Glucose, Bld: 97 mg/dL (ref 65–99)
Potassium: 3.6 mmol/L (ref 3.5–5.1)
Sodium: 137 mmol/L (ref 135–145)
Total Protein: 7.4 g/dL (ref 6.5–8.1)

## 2015-12-15 LAB — CBC
HCT: 38.8 % (ref 36.0–46.0)
HEMOGLOBIN: 12.9 g/dL (ref 12.0–15.0)
MCH: 26.1 pg (ref 26.0–34.0)
MCHC: 33.2 g/dL (ref 30.0–36.0)
MCV: 78.5 fL (ref 78.0–100.0)
Platelets: 309 10*3/uL (ref 150–400)
RBC: 4.94 MIL/uL (ref 3.87–5.11)
RDW: 13.8 % (ref 11.5–15.5)
WBC: 7.7 10*3/uL (ref 4.0–10.5)

## 2015-12-15 LAB — I-STAT BETA HCG BLOOD, ED (MC, WL, AP ONLY)

## 2015-12-15 NOTE — ED Notes (Signed)
Pt here with c/o body aches and states, "it feels like cold water running through my veins." She describes other symptoms such as dizziness, weakness, trouble catching her breath and she pointed to her chest and stated, "it feels like congestion."

## 2015-12-16 ENCOUNTER — Emergency Department (HOSPITAL_COMMUNITY)
Admission: EM | Admit: 2015-12-16 | Discharge: 2015-12-16 | Disposition: A | Discharge status: Home / Self Care | Payer: Commercial Managed Care - HMO | Attending: Emergency Medicine | Admitting: Emergency Medicine

## 2015-12-16 DIAGNOSIS — R531 Weakness: Secondary | ICD-10-CM

## 2015-12-16 DIAGNOSIS — B349 Viral infection, unspecified: Secondary | ICD-10-CM

## 2015-12-16 MED ORDER — OMEPRAZOLE 20 MG PO CPDR: 20.0000 mg | DELAYED_RELEASE_CAPSULE | Freq: Two times a day (BID) | ORAL | Status: DC

## 2015-12-16 NOTE — ED Notes (Signed)
MD at bedside. 

## 2015-12-16 NOTE — Discharge Instructions (Signed)
Prilosec as prescribed.  Start taking your Cipro as prescribed.  Follow-up with your primary Dr. if not improving in the next week.   Fatigue Fatigue is feeling tired all of the time, a lack of energy, or a lack of motivation. Occasional or mild fatigue is often a normal response to activity or life in general. However, long-lasting (chronic) or extreme fatigue may indicate an underlying medical condition. HOME CARE INSTRUCTIONS  Watch your fatigue for any changes. The following actions may help to lessen any discomfort you are feeling:  Talk to your health care provider about how much sleep you need each night. Try to get the required amount every night.  Take medicines only as directed by your health care provider.  Eat a healthy and nutritious diet. Ask your health care provider if you need help changing your diet.  Drink enough fluid to keep your urine clear or pale yellow.  Practice ways of relaxing, such as yoga, meditation, massage therapy, or acupuncture.  Exercise regularly.   Change situations that cause you stress. Try to keep your work and personal routine reasonable.  Do not abuse illegal drugs.  Limit alcohol intake to no more than 1 drink per day for nonpregnant women and 2 drinks per day for men. One drink equals 12 ounces of beer, 5 ounces of wine, or 1 ounces of hard liquor.  Take a multivitamin, if directed by your health care provider. SEEK MEDICAL CARE IF:   Your fatigue does not get better.  You have a fever.   You have unintentional weight loss or gain.  You have headaches.   You have difficulty:   Falling asleep.  Sleeping throughout the night.  You feel angry, guilty, anxious, or sad.   You are unable to have a bowel movement (constipation).   You skin is dry.   Your legs or another part of your body is swollen.  SEEK IMMEDIATE MEDICAL CARE IF:   You feel confused.   Your vision is blurry.  You feel faint or pass out.    You have a severe headache.   You have severe abdominal, pelvic, or back pain.   You have chest pain, shortness of breath, or an irregular or fast heartbeat.   You are unable to urinate or you urinate less than normal.   You develop abnormal bleeding, such as bleeding from the rectum, vagina, nose, lungs, or nipples.  You vomit blood.   You have thoughts about harming yourself or committing suicide.   You are worried that you might harm someone else.    This information is not intended to replace advice given to you by your health care provider. Make sure you discuss any questions you have with your health care provider.   Document Released: 07/22/2007 Document Revised: 10/15/2014 Document Reviewed: 01/26/2014 Elsevier Interactive Patient Education [employer]

## 2015-12-16 NOTE — ED Provider Notes (Signed)
CSN: [id number]     Arrival date & time 12/15/15  2220 History  By signing my name below, I, Terrance Branch, attest that this documentation has been prepared under the direction and in the presence of Geoffery Lyons, MD. Electronically Signed: Evon Slack, ED Scribe. 12/16/2015. 2:14 AM.    Chief Complaint  Patient presents with  . Generalized Body Aches   The history is provided by the patient. No language interpreter was used.   HPI Comments: Bethany Barrera is a 20 y.o. female who presents to the Emergency Department complaining of worsening generalized myalgias onset 3 weeks prior. Pt reports intermittent CP, weakness and dizziness. She reports also feeling as if she is having a hard time catching her breath and states that she believe she has chest congestion. Pt reports "coldness running through her body" but denies having chills. Pt doesn't report any medications PTA. Pt denies chills, cough or other related symptoms. Pt does recently report being diagnosed with an UTI. She states she has not been complaint with taking the antibiotic due to not feeling well.   Past Medical History  Diagnosis Date  . GERD (gastroesophageal reflux disease)   . Anxiety   . Allergy   . Gastritis    History reviewed. No pertinent past surgical history. Family History  Problem Relation Age of Onset  . Clotting disorder Maternal Grandfather   . Diabetes Maternal Grandfather   . Heart disease Maternal Grandfather   . Breast cancer Paternal Grandmother   . Diabetes Mother   . Other Mother     gallstones  . Other Sister     pre-diabetes  . Other Sister     gallstones   Social History  Substance Use Topics  . Smoking status: Never Smoker   . Smokeless tobacco: Never Used  . Alcohol Use: No   OB History    No data available     Review of Systems  Constitutional: Negative for chills.  HENT: Positive for congestion.   Respiratory: Negative for cough.   Cardiovascular: Positive for  chest pain.  Musculoskeletal: Positive for myalgias.  Neurological: Positive for dizziness and weakness.  All other systems reviewed and are negative.    Allergies  Sulfa antibiotics and Tramadol  Home Medications   Prior to Admission medications   Medication Sig Start Date End Date Taking? Authorizing Provider  cyclobenzaprine (FLEXERIL) 10 MG tablet Take 1 tablet (10 mg total) by mouth 2 (two) times daily as needed for muscle spasms. 12/08/15   Danelle Berry, PA-C  famotidine (PEPCID AC) 10 MG chewable tablet Chew 10 mg by mouth 2 (two) times daily.    Historical Provider, MD  fluticasone [facility] ALLERGY RELIEF) 50 MCG/ACT nasal spray Place into both nostrils daily.    Historical Provider, MD  hyoscyamine (LEVSIN SL) 0.125 MG SL tablet Place 1 tablet (0.125 mg total) under the tongue every 8 (eight) hours as needed for cramping. 11/23/15   Lori P Hvozdovic, PA-C  MONO-LINYAH 0.25-35 MG-MCG tablet Take 1 tablet by mouth daily. 03/01/15   Historical Provider, MD  ranitidine (ZANTAC) 150 MG tablet Take 1 tablet (150 mg total) by mouth 2 (two) times daily. 11/24/15   Lori P Hvozdovic, PA-C  sucralfate (CARAFATE) 1 g tablet Take 1 tablet (1 g total) by mouth 4 (four) times daily -  with meals and at bedtime. 12/08/15   Danelle Berry, PA-C   BP 125/79 mmHg  Pulse 98  Temp(Src) 99.1 F (37.3 C) (Oral)  Resp 16  SpO2 100%  LMP 12/01/2015   Physical Exam  Constitutional: She is oriented to person, place, and time. She appears well-developed and well-nourished. No distress.  HENT:  Head: Normocephalic and atraumatic.  Mouth/Throat: Oropharynx is clear and moist. No oropharyngeal exudate.  Eyes: Conjunctivae and EOM are normal. Pupils are equal, round, and reactive to light.  Neck: Normal range of motion. Neck supple.  No meningismus.  Cardiovascular: Normal rate, regular rhythm, normal heart sounds and intact distal pulses.   No murmur heard. Pulmonary/Chest: Effort normal and breath sounds  normal. No respiratory distress.  Abdominal: Soft. There is no tenderness. There is no rebound and no guarding.  Musculoskeletal: Normal range of motion. She exhibits no edema or tenderness.  Neurological: She is alert and oriented to person, place, and time. No cranial nerve deficit. She exhibits normal muscle tone. Coordination normal.  No ataxia on finger to nose bilaterally. No pronator drift. 5/5 strength throughout. CN 2-12 intact.Equal grip strength. Sensation intact.   Skin: Skin is warm.  Psychiatric: She has a normal mood and affect. Her behavior is normal.  Nursing note and vitals reviewed.   ED Course  Procedures (including critical care time) DIAGNOSTIC STUDIES: Oxygen Saturation is 100% on RA, normal by my interpretation.    COORDINATION OF CARE: 2:18 AM-Discussed treatment plan with pt at bedside and pt agreed to plan.     Labs Review Labs Reviewed  COMPREHENSIVE METABOLIC PANEL - Abnormal; Notable for the following:    CO2 21 (*)    ALT 10 (*)    All other components within normal limits  CBC  URINALYSIS, ROUTINE W REFLEX MICROSCOPIC (NOT AT [facility])  I-STAT BETA HCG BLOOD, ED ([facility], [facility], [facility] ONLY)    Imaging Review No results found. I have personally reviewed and evaluated these lab results as part of my medical decision-making.    MDM   Final diagnoses:  None      Patient presents with multiple complaints, none of which appear emergent. I suspect these are all related to some sort of viral process as I have a difficult time treating them to 1 organ system. She will be discharged with a prescription for Prilosec and is to follow-up with her primary Dr. if not improving in the next week.   I personally performed the services described in this documentation, which was scribed in my presence. The recorded information has been reviewed and is accurate.        Geoffery Lyons, MD 12/16/15 [phone]

## 2015-12-20 ENCOUNTER — Other Ambulatory Visit: Payer: Self-pay | Admitting: Gastroenterology

## 2015-12-20 DIAGNOSIS — R110 Nausea: Secondary | ICD-10-CM

## 2015-12-29 ENCOUNTER — Encounter (HOSPITAL_COMMUNITY): Payer: Self-pay

## 2015-12-29 ENCOUNTER — Ambulatory Visit (HOSPITAL_COMMUNITY)
Admission: RE | Admit: 2015-12-29 | Discharge: 2015-12-29 | Disposition: A | Discharge status: Home / Self Care | Payer: Commercial Managed Care - HMO | Source: Ambulatory Visit | Attending: Internal Medicine | Admitting: Internal Medicine

## 2015-12-29 ENCOUNTER — Other Ambulatory Visit (HOSPITAL_COMMUNITY): Payer: Self-pay | Admitting: Internal Medicine

## 2015-12-29 DIAGNOSIS — R1031 Right lower quadrant pain: Secondary | ICD-10-CM | POA: Insufficient documentation

## 2015-12-29 DIAGNOSIS — R110 Nausea: Secondary | ICD-10-CM | POA: Insufficient documentation

## 2015-12-29 MED ORDER — IOHEXOL 300 MG/ML  SOLN
50.0000 mL | Freq: Once | INTRAMUSCULAR | Status: AC | PRN
  Administered 2015-12-29: 50 mL via ORAL

## 2015-12-29 MED ORDER — IOHEXOL 300 MG/ML  SOLN
100.0000 mL | Freq: Once | INTRAMUSCULAR | Status: AC | PRN
  Administered 2015-12-29: 100 mL via INTRAVENOUS

## 2015-12-30 ENCOUNTER — Ambulatory Visit (HOSPITAL_COMMUNITY)
Admission: RE | Admit: 2015-12-30 | Discharge: 2015-12-30 | Disposition: A | Discharge status: Home / Self Care | Payer: Commercial Managed Care - HMO | Source: Ambulatory Visit | Attending: Gastroenterology | Admitting: Gastroenterology

## 2015-12-30 ENCOUNTER — Emergency Department (HOSPITAL_COMMUNITY)
Admission: EM | Admit: 2015-12-30 | Discharge: 2015-12-30 | Disposition: A | Discharge status: Home / Self Care | Payer: Commercial Managed Care - HMO | Attending: Physician Assistant | Admitting: Physician Assistant

## 2015-12-30 ENCOUNTER — Encounter (HOSPITAL_COMMUNITY): Payer: Self-pay

## 2015-12-30 DIAGNOSIS — E669 Obesity, unspecified: Secondary | ICD-10-CM | POA: Insufficient documentation

## 2015-12-30 DIAGNOSIS — R197 Diarrhea, unspecified: Secondary | ICD-10-CM | POA: Insufficient documentation

## 2015-12-30 DIAGNOSIS — R110 Nausea: Secondary | ICD-10-CM

## 2015-12-30 DIAGNOSIS — K219 Gastro-esophageal reflux disease without esophagitis: Secondary | ICD-10-CM | POA: Insufficient documentation

## 2015-12-30 DIAGNOSIS — R1031 Right lower quadrant pain: Secondary | ICD-10-CM | POA: Diagnosis not present

## 2015-12-30 DIAGNOSIS — R5383 Other fatigue: Secondary | ICD-10-CM | POA: Insufficient documentation

## 2015-12-30 DIAGNOSIS — Z3202 Encounter for pregnancy test, result negative: Secondary | ICD-10-CM | POA: Insufficient documentation

## 2015-12-30 DIAGNOSIS — R002 Palpitations: Secondary | ICD-10-CM | POA: Diagnosis not present

## 2015-12-30 DIAGNOSIS — Z793 Long term (current) use of hormonal contraceptives: Secondary | ICD-10-CM | POA: Insufficient documentation

## 2015-12-30 DIAGNOSIS — R531 Weakness: Secondary | ICD-10-CM | POA: Diagnosis present

## 2015-12-30 DIAGNOSIS — Z8659 Personal history of other mental and behavioral disorders: Secondary | ICD-10-CM | POA: Diagnosis not present

## 2015-12-30 DIAGNOSIS — Z79899 Other long term (current) drug therapy: Secondary | ICD-10-CM | POA: Diagnosis not present

## 2015-12-30 LAB — CBC
HCT: 39.6 % (ref 36.0–46.0)
HEMOGLOBIN: 13.2 g/dL (ref 12.0–15.0)
MCH: 26.2 pg (ref 26.0–34.0)
MCHC: 33.3 g/dL (ref 30.0–36.0)
MCV: 78.6 fL (ref 78.0–100.0)
Platelets: 351 10*3/uL (ref 150–400)
RBC: 5.04 MIL/uL (ref 3.87–5.11)
RDW: 14.1 % (ref 11.5–15.5)
WBC: 4.7 10*3/uL (ref 4.0–10.5)

## 2015-12-30 LAB — URINALYSIS, ROUTINE W REFLEX MICROSCOPIC
BILIRUBIN URINE: NEGATIVE
Glucose, UA: NEGATIVE mg/dL
Ketones, ur: 15 mg/dL — AB
Nitrite: NEGATIVE
Protein, ur: NEGATIVE mg/dL
SPECIFIC GRAVITY, URINE: 1.011 (ref 1.005–1.030)
pH: 7 (ref 5.0–8.0)

## 2015-12-30 LAB — URINE MICROSCOPIC-ADD ON

## 2015-12-30 LAB — COMPREHENSIVE METABOLIC PANEL
ALBUMIN: 3.9 g/dL (ref 3.5–5.0)
ALK PHOS: 71 U/L (ref 38–126)
ALT: 11 U/L — AB (ref 14–54)
ANION GAP: 13 (ref 5–15)
AST: 19 U/L (ref 15–41)
CALCIUM: 9.4 mg/dL (ref 8.9–10.3)
CO2: 19 mmol/L — AB (ref 22–32)
CREATININE: 0.74 mg/dL (ref 0.44–1.00)
Chloride: 106 mmol/L (ref 101–111)
GFR calc Af Amer: >60 mL/min (ref >60)
GFR calc non Af Amer: >60 mL/min (ref >60)
GLUCOSE: 86 mg/dL (ref 65–99)
Potassium: 3.7 mmol/L (ref 3.5–5.1)
SODIUM: 138 mmol/L (ref 135–145)
Total Bilirubin: <0.1 mg/dL — ABNORMAL LOW (ref 0.3–1.2)
Total Protein: 7.6 g/dL (ref 6.5–8.1)

## 2015-12-30 LAB — I-STAT BETA HCG BLOOD, ED (MC, WL, AP ONLY): I-stat hCG, quantitative: <5 m[IU]/mL (ref <5)

## 2015-12-30 LAB — LIPASE, BLOOD: Lipase: 56 U/L — ABNORMAL HIGH (ref 11–51)

## 2015-12-30 MED ORDER — TECHNETIUM TC 99M MEBROFENIN IV KIT
5.1000 | PACK | Freq: Once | INTRAVENOUS | Status: AC | PRN
  Administered 2015-12-30: 5 via INTRAVENOUS

## 2015-12-30 NOTE — ED Provider Notes (Signed)
CSN: [id number]     Arrival date & time 12/30/15  1812 History  By signing my name below, I, [name], attest that this documentation has been prepared under the direction and in the presence of non-physician practitioner, Joycie Peek, PA-C. Electronically Signed: [name], Scribe. 12/30/2015. 8:57 PM.  Chief Complaint  Patient presents with  . Weakness    The history is provided by the patient. No language interpreter was used.    HPI Comments:  Bethany Barrera is a 20 y.o. female who presents to the Emergency Department complaining of generalized weakness x 2 weeks, worse today. Pt reports associated dry mouth/throat, occasional palpitations, and diarrhea. No alleviating factors noted. Pt states she has been evaluated by PCP for the same and they will be referring her to a neuromuscular specialist. She denies LE swelling, fever, nausea, vomiting. No chest pain, shortness of breath, headache, vision changes, other numbness or focal weakness. She also denies recent travel/hospitalization, illicit drug use, and ETOH use. Pt is currently on BCP and has been for awhile.   PCP: Sherwood Gambler   Past Medical History  Diagnosis Date  . GERD (gastroesophageal reflux disease)   . Anxiety   . Allergy   . Gastritis    History reviewed. No pertinent past surgical history. Family History  Problem Relation Age of Onset  . Clotting disorder Maternal Grandfather   . Diabetes Maternal Grandfather   . Heart disease Maternal Grandfather   . Breast cancer Paternal Grandmother   . Diabetes Mother   . Other Mother     gallstones  . Other Sister     pre-diabetes  . Other Sister     gallstones   Social History  Substance Use Topics  . Smoking status: Never Smoker   . Smokeless tobacco: Never Used  . Alcohol Use: No   OB History    No data available     Review of Systems  10 systems reviewed and all are negative for acute change except as noted in the HPI.  Allergies  Sulfa  antibiotics and Tramadol  Home Medications   Prior to Admission medications   Medication Sig Start Date End Date Taking? Authorizing Provider  cyclobenzaprine (FLEXERIL) 10 MG tablet Take 1 tablet (10 mg total) by mouth 2 (two) times daily as needed for muscle spasms. 12/08/15   Danelle Berry, PA-C  famotidine (PEPCID AC) 10 MG chewable tablet Chew 10 mg by mouth 2 (two) times daily.    Historical Provider, MD  fluticasone [facility] ALLERGY RELIEF) 50 MCG/ACT nasal spray Place into both nostrils daily.    Historical Provider, MD  hyoscyamine (LEVSIN SL) 0.125 MG SL tablet Place 1 tablet (0.125 mg total) under the tongue every 8 (eight) hours as needed for cramping. 11/23/15   Lori P Hvozdovic, PA-C  MONO-LINYAH 0.25-35 MG-MCG tablet Take 1 tablet by mouth daily. 03/01/15   Historical Provider, MD  omeprazole (PRILOSEC) 20 MG capsule Take 1 capsule (20 mg total) by mouth 2 (two) times daily before a meal. 12/16/15   Geoffery Lyons, MD  ranitidine (ZANTAC) 150 MG tablet Take 1 tablet (150 mg total) by mouth 2 (two) times daily. 11/24/15   Lori P Hvozdovic, PA-C  sucralfate (CARAFATE) 1 g tablet Take 1 tablet (1 g total) by mouth 4 (four) times daily -  with meals and at bedtime. 12/08/15   Danelle Berry, PA-C   BP 120/79 mmHg  Pulse 92  Temp(Src) 98.3 F (36.8 C)  Resp 18  Ht 5'  5.25" (1.657 m)  Wt 198 lb 7 oz (90.011 kg)  BMI 32.78 kg/m2  SpO2 100%  LMP 12/29/2015 Physical Exam  Constitutional: She is oriented to person, place, and time. She appears well-developed and well-nourished. No distress.  Obese African American female  HENT:  Head: Normocephalic and atraumatic.  Mouth/Throat: Oropharynx is clear and moist.  Eyes: Conjunctivae are normal. Pupils are equal, round, and reactive to light. Right eye exhibits no discharge. Left eye exhibits no discharge. No scleral icterus.  Neck: Normal range of motion. Neck supple.  Cardiovascular: Normal rate, regular rhythm and normal heart sounds.    Pulmonary/Chest: Effort normal and breath sounds normal. No respiratory distress. She has no wheezes. She has no rales.  Abdominal: Soft. She exhibits no distension. There is no tenderness.  Musculoskeletal: Normal range of motion. She exhibits no edema or tenderness.  Neurological: She is alert and oriented to person, place, and time. No cranial nerve deficit. Coordination normal.  Cranial Nerves II-XII grossly intact. Moves all extremities without ataxia. Gait is baseline. Sensation baseline.  Skin: Skin is warm and dry. No rash noted.  Psychiatric: She has a normal mood and affect.  Nursing note and vitals reviewed.   ED Course  Procedures   DIAGNOSTIC STUDIES:  Oxygen Saturation is 100% on RA, normal by my interpretation.    COORDINATION OF CARE:  8:50 PM Discussed treatment plan with pt at bedside and pt agreed to plan.  Labs Review Labs Reviewed  LIPASE, BLOOD - Abnormal; Notable for the following:    Lipase 56 (*)    All other components within normal limits  COMPREHENSIVE METABOLIC PANEL - Abnormal; Notable for the following:    CO2 19 (*)    BUN <5 (*)    ALT 11 (*)    Total Bilirubin <0.1 (*)    All other components within normal limits  URINALYSIS, ROUTINE W REFLEX MICROSCOPIC (NOT AT [facility]) - Abnormal; Notable for the following:    Color, Urine PINK (*)    APPearance CLOUDY (*)    Hgb urine dipstick LARGE (*)    Ketones, ur 15 (*)    Leukocytes, UA TRACE (*)    All other components within normal limits  URINE MICROSCOPIC-ADD ON - Abnormal; Notable for the following:    Squamous Epithelial / LPF 0-5 (*)    Bacteria, UA RARE (*)    All other components within normal limits  CBC  I-STAT BETA HCG BLOOD, ED ([facility], [facility], [facility] ONLY)    Imaging Review Ct Abdomen Pelvis W Contrast  12/29/2015  CLINICAL DATA:  Right lower quadrant abdominal pain for 2 weeks. EXAM: CT ABDOMEN AND PELVIS WITH CONTRAST TECHNIQUE: Multidetector CT imaging of the abdomen and pelvis was  performed using the standard protocol following bolus administration of intravenous contrast. CONTRAST:  OMNIPAQUE IOHEXOL 300 MG/ML SOLN, 50mL OMNIPAQUE IOHEXOL 300 MG/ML SOLN COMPARISON:  12/22/2008 FINDINGS: Lower chest: The lung bases are clear of acute process. No pleural effusion or pulmonary lesions. The heart is normal in size. No pericardial effusion. The distal esophagus and aorta are unremarkable. Hepatobiliary: No focal hepatic lesions or intrahepatic biliary dilatation. The gallbladder is normal. No common bile duct dilatation. Pancreas: No mass, inflammation or ductal dilatation. Spleen: Normal size.  No focal lesions. Adrenals/Urinary Tract: The adrenal glands and kidneys are normal. No ureteral or bladder calculi. No bladder mass. There may be a tiny calcification noted in a small urachal diverticulum. Stomach/Bowel: The stomach, duodenum, small bowel and colon are  unremarkable. No inflammatory changes, mass lesions or obstructive findings. The terminal ileum is normal. The appendix is normal. Vascular/Lymphatic: No mesenteric or retroperitoneal mass or adenopathy. The aorta and branch vessels are normal. The major venous structures are patent. Reproductive: The uterus and ovaries are normal. Other: No pelvic mass or adenopathy. No free pelvic fluid collections. No inguinal mass or adenopathy. Musculoskeletal: No significant bony findings. IMPRESSION: Unremarkable CT examination of the abdomen/pelvis. No acute abdominal/ pelvic findings, mass lesions or lymphadenopathy. Electronically Signed   By: Rudie Meyer M.D.   On: 12/29/2015 19:16   Nm Hepato W/eject Fract  12/30/2015  CLINICAL DATA:  Gastritis EXAM: NUCLEAR MEDICINE HEPATOBILIARY IMAGING WITH GALLBLADDER EF TECHNIQUE: Sequential images of the abdomen were obtained out to 60 minutes following intravenous administration of radiopharmaceutical. After oral ingestion of 8 ounces of Half-and-Half cream, gallbladder ejection fraction was  determined. RADIOPHARMACEUTICALS:  5.1 mCi Tc-81m Choletec IV COMPARISON:  None. FINDINGS: Gallbladder activity is noted after 10 minutes. Small bowel activity is noted after 17 minutes. Gallbladder ejection fraction is 46% after 1 hour. Normal is greater than 33%. IMPRESSION: Cystic and common bile ducts are patent. Gallbladder ejection fraction is within normal limits. Gastritis Select proper NMHepatobiliary template. Electronically Signed   By: Jolaine Click M.D.   On: 12/30/2015 12:13   I have personally reviewed and evaluated these images and lab results as part of my medical decision-making.   Filed Vitals:   12/30/15 1818 12/30/15 2032  BP: 127/84 120/79  Pulse: 100 92  Temp: 98.3 F (36.8 C)   Resp: 18 18  Height: 5' 5.25" (1.657 m)   Weight: 198 lb 7 oz (90.011 kg)   SpO2: 93% 100%    Meds given in ED:  Medications - No data to display  New Prescriptions   No medications on file    MDM  TEIRA MARCOE is a 20 y.o. female with no significant past medical history who comes in for evaluation of fatigue. Patient reports symptoms have been ongoing intermittently over the past 2 weeks. On arrival, she is hemodynamically stable and afebrile. Her exam is unremarkable. Labs are also unremarkable. Mildly low CO2, 19, possibly secondary to diarrhea. No evidence of anemia, pregnancy negative. She does have large hemoglobin in her urine, but also reports she is on her menstrual cycle now. Discussed follow-up with PCP for further evaluation and management of fatigue. No evidence of other acute or emergent pathology at this time. Stable for discharge and outpatient follow-up. Discussed return precautions. She verbalizes understanding and agrees with this plan as well as subsequent discharge. Final diagnoses:  Other fatigue    I personally performed the services described in this documentation, which was scribed in my presence. The recorded information has been reviewed and is  accurate.   Joycie Peek, PA-C 12/30/15 2115  Courteney Randall An, MD 12/31/15 [id number]

## 2015-12-30 NOTE — Discharge Instructions (Signed)
There does not appear to be an emergent cause for your symptoms at this time. Ear exam and labs were all reassuring. Follow-up with your doctor for reevaluation as we discussed. Return to ED for any new or worsening symptoms.  Fatigue Fatigue is feeling tired all of the time, a lack of energy, or a lack of motivation. Occasional or mild fatigue is often a normal response to activity or life in general. However, long-lasting (chronic) or extreme fatigue may indicate an underlying medical condition. HOME CARE INSTRUCTIONS  Watch your fatigue for any changes. The following actions may help to lessen any discomfort you are feeling:  Talk to your health care provider about how much sleep you need each night. Try to get the required amount every night.  Take medicines only as directed by your health care provider.  Eat a healthy and nutritious diet. Ask your health care provider if you need help changing your diet.  Drink enough fluid to keep your urine clear or pale yellow.  Practice ways of relaxing, such as yoga, meditation, massage therapy, or acupuncture.  Exercise regularly.   Change situations that cause you stress. Try to keep your work and personal routine reasonable.  Do not abuse illegal drugs.  Limit alcohol intake to no more than 1 drink per day for nonpregnant women and 2 drinks per day for men. One drink equals 12 ounces of beer, 5 ounces of wine, or 1 ounces of hard liquor.  Take a multivitamin, if directed by your health care provider. SEEK MEDICAL CARE IF:   Your fatigue does not get better.  You have a fever.   You have unintentional weight loss or gain.  You have headaches.   You have difficulty:   Falling asleep.  Sleeping throughout the night.  You feel angry, guilty, anxious, or sad.   You are unable to have a bowel movement (constipation).   You skin is dry.   Your legs or another part of your body is swollen.  SEEK IMMEDIATE MEDICAL CARE  IF:   You feel confused.   Your vision is blurry.  You feel faint or pass out.   You have a severe headache.   You have severe abdominal, pelvic, or back pain.   You have chest pain, shortness of breath, or an irregular or fast heartbeat.   You are unable to urinate or you urinate less than normal.   You develop abnormal bleeding, such as bleeding from the rectum, vagina, nose, lungs, or nipples.  You vomit blood.   You have thoughts about harming yourself or committing suicide.   You are worried that you might harm someone else.    This information is not intended to replace advice given to you by your health care provider. Make sure you discuss any questions you have with your health care provider.   Document Released: 07/22/2007 Document Revised: 10/15/2014 Document Reviewed: 01/26/2014 Elsevier Interactive Patient Education [employer]

## 2015-12-30 NOTE — ED Notes (Addendum)
Pt here with c/o weakness and SOB. She reports her mouth is dry and has been having trouble swallowing and breathing. She states the muscles in her throat hurt as well. Airway clear, pt speaking clear complete sentences.

## 2016-01-03 ENCOUNTER — Encounter (HOSPITAL_COMMUNITY): Payer: Self-pay | Admitting: Emergency Medicine

## 2016-01-03 DIAGNOSIS — R1310 Dysphagia, unspecified: Secondary | ICD-10-CM | POA: Diagnosis not present

## 2016-01-03 DIAGNOSIS — K2970 Gastritis, unspecified, without bleeding: Secondary | ICD-10-CM | POA: Insufficient documentation

## 2016-01-03 DIAGNOSIS — Z7951 Long term (current) use of inhaled steroids: Secondary | ICD-10-CM | POA: Diagnosis not present

## 2016-01-03 DIAGNOSIS — K219 Gastro-esophageal reflux disease without esophagitis: Secondary | ICD-10-CM | POA: Insufficient documentation

## 2016-01-03 DIAGNOSIS — R002 Palpitations: Secondary | ICD-10-CM | POA: Diagnosis not present

## 2016-01-03 DIAGNOSIS — Z3202 Encounter for pregnancy test, result negative: Secondary | ICD-10-CM | POA: Diagnosis not present

## 2016-01-03 DIAGNOSIS — N739 Female pelvic inflammatory disease, unspecified: Secondary | ICD-10-CM | POA: Diagnosis not present

## 2016-01-03 DIAGNOSIS — R531 Weakness: Secondary | ICD-10-CM | POA: Insufficient documentation

## 2016-01-03 DIAGNOSIS — R5383 Other fatigue: Secondary | ICD-10-CM | POA: Diagnosis present

## 2016-01-03 DIAGNOSIS — R197 Diarrhea, unspecified: Secondary | ICD-10-CM | POA: Diagnosis not present

## 2016-01-03 DIAGNOSIS — R630 Anorexia: Secondary | ICD-10-CM | POA: Insufficient documentation

## 2016-01-03 DIAGNOSIS — Z79899 Other long term (current) drug therapy: Secondary | ICD-10-CM | POA: Diagnosis not present

## 2016-01-03 DIAGNOSIS — Z8659 Personal history of other mental and behavioral disorders: Secondary | ICD-10-CM | POA: Diagnosis not present

## 2016-01-03 DIAGNOSIS — R200 Anesthesia of skin: Secondary | ICD-10-CM | POA: Diagnosis not present

## 2016-01-03 LAB — COMPREHENSIVE METABOLIC PANEL
ALBUMIN: 4.3 g/dL (ref 3.5–5.0)
ALT: 11 U/L — ABNORMAL LOW (ref 14–54)
ANION GAP: 14 (ref 5–15)
AST: 17 U/L (ref 15–41)
Alkaline Phosphatase: 79 U/L (ref 38–126)
BILIRUBIN TOTAL: 0.5 mg/dL (ref 0.3–1.2)
CHLORIDE: 105 mmol/L (ref 101–111)
CO2: 22 mmol/L (ref 22–32)
Calcium: 9.8 mg/dL (ref 8.9–10.3)
Creatinine, Ser: 0.78 mg/dL (ref 0.44–1.00)
GFR calc Af Amer: >60 mL/min (ref >60)
GFR calc non Af Amer: >60 mL/min (ref >60)
GLUCOSE: 83 mg/dL (ref 65–99)
POTASSIUM: 3.3 mmol/L — AB (ref 3.5–5.1)
SODIUM: 141 mmol/L (ref 135–145)
TOTAL PROTEIN: 8.3 g/dL — AB (ref 6.5–8.1)

## 2016-01-03 LAB — URINALYSIS, ROUTINE W REFLEX MICROSCOPIC
GLUCOSE, UA: NEGATIVE mg/dL
Nitrite: NEGATIVE
PH: 6.5 (ref 5.0–8.0)
Protein, ur: NEGATIVE mg/dL
SPECIFIC GRAVITY, URINE: 1.021 (ref 1.005–1.030)

## 2016-01-03 LAB — CBC
HEMATOCRIT: 40.6 % (ref 36.0–46.0)
HEMOGLOBIN: 13.6 g/dL (ref 12.0–15.0)
MCH: 26.3 pg (ref 26.0–34.0)
MCHC: 33.5 g/dL (ref 30.0–36.0)
MCV: 78.4 fL (ref 78.0–100.0)
Platelets: 344 10*3/uL (ref 150–400)
RBC: 5.18 MIL/uL — ABNORMAL HIGH (ref 3.87–5.11)
RDW: 13.8 % (ref 11.5–15.5)
WBC: 7.1 10*3/uL (ref 4.0–10.5)

## 2016-01-03 LAB — URINE MICROSCOPIC-ADD ON

## 2016-01-03 LAB — POC URINE PREG, ED: Preg Test, Ur: NEGATIVE

## 2016-01-03 NOTE — ED Notes (Signed)
Pt. reports intermittent diarrhea for 1 month with generalized weakness/fatigue , denies fever/emesis  or chills.

## 2016-01-04 ENCOUNTER — Emergency Department (HOSPITAL_COMMUNITY)
Admission: EM | Admit: 2016-01-04 | Discharge: 2016-01-04 | Disposition: A | Discharge status: Home / Self Care | Payer: Commercial Managed Care - HMO | Attending: Emergency Medicine | Admitting: Emergency Medicine

## 2016-01-04 ENCOUNTER — Emergency Department (HOSPITAL_COMMUNITY): Payer: Commercial Managed Care - HMO

## 2016-01-04 DIAGNOSIS — N730 Acute parametritis and pelvic cellulitis: Secondary | ICD-10-CM

## 2016-01-04 DIAGNOSIS — R109 Unspecified abdominal pain: Secondary | ICD-10-CM

## 2016-01-04 LAB — WET PREP, GENITAL
Sperm: NONE SEEN
TRICH WET PREP: NONE SEEN
YEAST WET PREP: NONE SEEN

## 2016-01-04 LAB — HIV ANTIBODY (ROUTINE TESTING W REFLEX): HIV Screen 4th Generation wRfx: NONREACTIVE

## 2016-01-04 LAB — T4, FREE: Free T4: 1.09 ng/dL (ref 0.61–1.12)

## 2016-01-04 LAB — TSH: TSH: 4.413 u[IU]/mL (ref 0.350–4.500)

## 2016-01-04 LAB — GC/CHLAMYDIA PROBE AMP (~~LOC~~) NOT AT ARMC
CHLAMYDIA, DNA PROBE: NEGATIVE
NEISSERIA GONORRHEA: NEGATIVE

## 2016-01-04 LAB — CK: Total CK: 50 U/L (ref 38–234)

## 2016-01-04 MED ORDER — DEXTROSE 5 % IV SOLN
1000.0000 mg | Freq: Once | INTRAVENOUS | Status: AC
  Administered 2016-01-04: 1000 mg via INTRAVENOUS
  Filled 2016-01-04: qty 10

## 2016-01-04 MED ORDER — DOXYCYCLINE HYCLATE 100 MG PO CAPS
100.0000 mg | ORAL_CAPSULE | Freq: Two times a day (BID) | ORAL | Status: DC
Start: 2016-01-04 — End: 2016-01-24

## 2016-01-04 MED ORDER — DOXYCYCLINE HYCLATE 100 MG PO TABS
100.0000 mg | ORAL_TABLET | Freq: Once | ORAL | Status: AC
  Administered 2016-01-04: 100 mg via ORAL
  Filled 2016-01-04: qty 1

## 2016-01-04 MED ORDER — SODIUM CHLORIDE 0.9 % IV BOLUS (SEPSIS)
2000.0000 mL | Freq: Once | INTRAVENOUS | Status: AC
Start: 2016-01-04 — End: 2016-01-04
  Administered 2016-01-04: 2000 mL via INTRAVENOUS

## 2016-01-04 NOTE — Discharge Instructions (Signed)
Pelvic Inflammatory Disease Bethany Barrera, Take antibiotics for 10 days for treatment of your infection.  See your regular doctor within 3 days for close follow up.  If symptoms worsen, come back to the ED immediately. Thank you. Pelvic inflammatory disease (PID) is an infection in some or all of the female organs. PID can be in the uterus, ovaries, fallopian tubes, or the surrounding tissues that are inside the lower belly area (pelvis). PID can lead to lasting problems if it is not treated. To check for this disease, your doctor may:  Do a physical exam.  Do blood tests, urine tests, or a pregnancy test.  Look at your vaginal discharge.  Do tests to look inside the pelvis.  Test you for other infections. HOME CARE  Take over-the-counter and prescription medicines only as told by your doctor.  If you were prescribed an antibiotic medicine, take it as told by your doctor. Do not stop taking it even if you start to feel better.  Do not have sex until treatment is done or as told by your doctor.  Tell your sex partner if you have PID. Your partner may need to be treated.  Keep all follow-up visits as told by your doctor. This is important.  Your doctor may test you for infection again 3 months after you are treated. GET HELP IF:  You have more fluid (discharge) coming from your vagina or fluid that is not normal.  Your pain does not improve.  You throw up (vomit).  You have a fever.  You cannot take your medicines.  Your partner has a sexually transmitted disease (STD).  You have pain when you pee (urinate). GET HELP RIGHT AWAY IF:  You have more belly (abdominal) or lower belly pain.  You have chills.  You are not better after 72 hours.   This information is not intended to replace advice given to you by your health care provider. Make sure you discuss any questions you have with your health care provider.   Document Released: 12/21/2008 Document Revised: 06/15/2015  Document Reviewed: 11/01/2014 Elsevier Interactive Patient Education [employer]

## 2016-01-04 NOTE — ED Provider Notes (Signed)
CSN: [id number]     Arrival date & time 01/03/16  1941 History  By signing my name below, I, Bethany Barrera, attest that this documentation has been prepared under the direction and in the presence of Tomasita Crumble, MD. Electronically Signed: [name], ED Scribe. 01/04/2016. 1:17 AM   Chief Complaint  Patient presents with  . Diarrhea  . Fatigue   The history is provided by the patient. No language interpreter was used.   Bethany Barrera is a 20 y.o. female with history of gastritis who presents to the Emergency Department complaining of new and worsening fatigue and generalized weakness with onset 1 month ago. Pt states that it is hard for her to use all of her muscles including the ones in her throat to swallow. She states that today she developed a numbness in her throat that made it even more difficult to eat. Associated symptoms include non-bloody diarrhea for 1 month, decreased appetite, and palpitations. This month her menstrual period was significantly heavier than usual but normal in length. Pt denies vomiting, abdominal pain, dysuria, and hematuria. She is concerned for thyroid disease despite having  has no personal or family history of such. No recent sports activity or heavy lifting.   Past Medical History  Diagnosis Date  . GERD (gastroesophageal reflux disease)   . Anxiety   . Allergy   . Gastritis    History reviewed. No pertinent past surgical history. Family History  Problem Relation Age of Onset  . Clotting disorder Maternal Grandfather   . Diabetes Maternal Grandfather   . Heart disease Maternal Grandfather   . Breast cancer Paternal Grandmother   . Diabetes Mother   . Other Mother     gallstones  . Other Sister     pre-diabetes  . Other Sister     gallstones   Social History  Substance Use Topics  . Smoking status: Never Smoker   . Smokeless tobacco: Never Used  . Alcohol Use: No   OB History    No data available     Review of Systems 10  Systems reviewed and all are negative for acute change except as noted in the HPI.   Allergies  Sulfa antibiotics and Tramadol  Home Medications   Prior to Admission medications   Medication Sig Start Date End Date Taking? Authorizing Provider  cyclobenzaprine (FLEXERIL) 10 MG tablet Take 1 tablet (10 mg total) by mouth 2 (two) times daily as needed for muscle spasms. 12/08/15   Danelle Berry, PA-C  famotidine (PEPCID AC) 10 MG chewable tablet Chew 10 mg by mouth 2 (two) times daily.    Historical Provider, MD  fluticasone [facility] ALLERGY RELIEF) 50 MCG/ACT nasal spray Place into both nostrils daily.    Historical Provider, MD  hyoscyamine (LEVSIN SL) 0.125 MG SL tablet Place 1 tablet (0.125 mg total) under the tongue every 8 (eight) hours as needed for cramping. 11/23/15   Lori P Hvozdovic, PA-C  MONO-LINYAH 0.25-35 MG-MCG tablet Take 1 tablet by mouth daily. 03/01/15   Historical Provider, MD  omeprazole (PRILOSEC) 20 MG capsule Take 1 capsule (20 mg total) by mouth 2 (two) times daily before a meal. 12/16/15   Geoffery Lyons, MD  ranitidine (ZANTAC) 150 MG tablet Take 1 tablet (150 mg total) by mouth 2 (two) times daily. 11/24/15   Lori P Hvozdovic, PA-C  sucralfate (CARAFATE) 1 g tablet Take 1 tablet (1 g total) by mouth 4 (four) times daily -  with meals and at bedtime.  12/08/15   Danelle Berry, PA-C   BP 125/84 mmHg  Pulse 76  Temp(Src) 97.6 F (36.4 C) (Oral)  Resp 16  Ht 5' 5.25" (1.657 m)  Wt 193 lb (87.544 kg)  BMI 31.88 kg/m2  SpO2 98%  LMP 12/29/2015 Physical Exam  Constitutional: She is oriented to person, place, and time. She appears well-developed and well-nourished. No distress.  HENT:  Head: Normocephalic and atraumatic.  Nose: Nose normal.  Mouth/Throat: Oropharynx is clear and moist. No oropharyngeal exudate.  Eyes: Conjunctivae and EOM are normal. Pupils are equal, round, and reactive to light. No scleral icterus.  Neck: Normal range of motion. Neck supple. No JVD present.  No tracheal deviation present. No thyromegaly present.  Cardiovascular: Normal rate, regular rhythm and normal heart sounds.  Exam reveals no gallop and no friction rub.   No murmur heard. Pulmonary/Chest: Effort normal and breath sounds normal. No respiratory distress. She has no wheezes. She exhibits no tenderness.  Abdominal: Soft. Bowel sounds are normal. She exhibits no distension and no mass. There is no tenderness. There is no rebound and no guarding.  Genitourinary: Vaginal discharge found.  + CMT  Musculoskeletal: Normal range of motion. She exhibits no edema or tenderness.  Lymphadenopathy:    She has no cervical adenopathy.  Neurological: She is alert and oriented to person, place, and time. No cranial nerve deficit. She exhibits normal muscle tone.  Skin: Skin is warm and dry. No rash noted. No erythema. No pallor.  Nursing note and vitals reviewed.   ED Course  Procedures (including critical care time) DIAGNOSTIC STUDIES: Oxygen Saturation is 98% on RA,  normal by my interpretation.    COORDINATION OF CARE: 1:14 AM Discussed treatment plan which includes lab work with pt at bedside and pt agreed to plan.  Labs Review Labs Reviewed  WET PREP, GENITAL - Abnormal; Notable for the following:    Clue Cells Wet Prep HPF POC PRESENT (*)    WBC, Wet Prep HPF POC MANY (*)    All other components within normal limits  COMPREHENSIVE METABOLIC PANEL - Abnormal; Notable for the following:    Potassium 3.3 (*)    BUN <5 (*)    Total Protein 8.3 (*)    ALT 11 (*)    All other components within normal limits  CBC - Abnormal; Notable for the following:    RBC 5.18 (*)    All other components within normal limits  URINALYSIS, ROUTINE W REFLEX MICROSCOPIC (NOT AT [facility]) - Abnormal; Notable for the following:    APPearance CLOUDY (*)    Hgb urine dipstick SMALL (*)    Bilirubin Urine SMALL (*)    Ketones, ur >80 (*)    Leukocytes, UA LARGE (*)    All other components within normal  limits  URINE MICROSCOPIC-ADD ON - Abnormal; Notable for the following:    Squamous Epithelial / LPF 6-30 (*)    Bacteria, UA FEW (*)    All other components within normal limits  T4, FREE  CK  TSH  HIV ANTIBODY (ROUTINE TESTING)  POC URINE PREG, ED  GC/CHLAMYDIA PROBE AMP ([place]) NOT AT [facility]    Imaging Review US Transvaginal Non-ob  01/04/2016  CLINICAL DATA:  Subacute onset of left lower quadrant abdominal pain. Initial encounter. EXAM: TRANSABDOMINAL AND TRANSVAGINAL ULTRASOUND OF PELVIS TECHNIQUE: Both transabdominal and transvaginal ultrasound examinations of the pelvis were performed. Transabdominal technique was performed for global imaging of the pelvis including uterus, ovaries, adnexal regions, and  pelvic cul-de-sac. It was necessary to proceed with endovaginal exam following the transabdominal exam to visualize the endometrium and ovaries. COMPARISON:  CT of the abdomen and pelvis performed 12/29/2015, and pelvic ultrasound performed 03/30/2014 FINDINGS: Uterus Measurements: 5.6 x 2.8 x 4.0 cm. No fibroids or other mass visualized. Endometrium Thickness: 0.3 cm.  No focal abnormality visualized. Right ovary Measurements: 3.1 x 2.3 x 3.0 cm. Normal appearance/no adnexal mass. Left ovary Measurements: 3.2 x 2.0 x 2.6 cm. Normal appearance/no adnexal mass. Limited Doppler evaluation demonstrates normal color Doppler blood flow with regard to both ovaries. There is no evidence for ovarian torsion. Other findings No free fluid is seen within the pelvic cul-de-sac. Mildly echogenic debris is noted within the bladder. IMPRESSION: 1. Unremarkable pelvic ultrasound.  No evidence for ovarian torsion. 2. Mildly echogenic debris noted within the bladder. Would correlate with urinalysis results. Electronically Signed   By: Roanna Raider M.D.   On: 01/04/2016 03:52   US Pelvis Complete  01/04/2016  CLINICAL DATA:  Subacute onset of left lower quadrant abdominal pain. Initial encounter. EXAM:  TRANSABDOMINAL AND TRANSVAGINAL ULTRASOUND OF PELVIS TECHNIQUE: Both transabdominal and transvaginal ultrasound examinations of the pelvis were performed. Transabdominal technique was performed for global imaging of the pelvis including uterus, ovaries, adnexal regions, and pelvic cul-de-sac. It was necessary to proceed with endovaginal exam following the transabdominal exam to visualize the endometrium and ovaries. COMPARISON:  CT of the abdomen and pelvis performed 12/29/2015, and pelvic ultrasound performed 03/30/2014 FINDINGS: Uterus Measurements: 5.6 x 2.8 x 4.0 cm. No fibroids or other mass visualized. Endometrium Thickness: 0.3 cm.  No focal abnormality visualized. Right ovary Measurements: 3.1 x 2.3 x 3.0 cm. Normal appearance/no adnexal mass. Left ovary Measurements: 3.2 x 2.0 x 2.6 cm. Normal appearance/no adnexal mass. Limited Doppler evaluation demonstrates normal color Doppler blood flow with regard to both ovaries. There is no evidence for ovarian torsion. Other findings No free fluid is seen within the pelvic cul-de-sac. Mildly echogenic debris is noted within the bladder. IMPRESSION: 1. Unremarkable pelvic ultrasound.  No evidence for ovarian torsion. 2. Mildly echogenic debris noted within the bladder. Would correlate with urinalysis results. Electronically Signed   By: Roanna Raider M.D.   On: 01/04/2016 03:52   I have personally reviewed and evaluated these lab results as part of my medical decision-making.   EKG Interpretation None      MDM   Final diagnoses:  None   Patient presents to the ED for abdominal pain.  Pelvic is abnormal and concerning for PID.  [place] negative for TOA.  She was given ceftriaxone and doxy x 10days.  PCP fu advised within 3 days.  She appears well and in NAD.  She was rehydrated with IVF.  VS remain within her normal limits and she is safe for DC.    I personally performed the services described in this documentation, which was scribed in my presence.  The recorded information has been reviewed and is accurate.     Tomasita Crumble, MD 01/04/16 [phone]

## 2016-01-06 ENCOUNTER — Emergency Department (HOSPITAL_COMMUNITY)
Admission: EM | Admit: 2016-01-06 | Discharge: 2016-01-06 | Disposition: A | Discharge status: Home / Self Care | Payer: Commercial Managed Care - HMO | Attending: Emergency Medicine | Admitting: Emergency Medicine

## 2016-01-06 ENCOUNTER — Encounter (HOSPITAL_COMMUNITY): Payer: Self-pay | Admitting: *Deleted

## 2016-01-06 ENCOUNTER — Emergency Department (HOSPITAL_COMMUNITY): Payer: Commercial Managed Care - HMO

## 2016-01-06 DIAGNOSIS — M542 Cervicalgia: Secondary | ICD-10-CM | POA: Insufficient documentation

## 2016-01-06 DIAGNOSIS — R52 Pain, unspecified: Secondary | ICD-10-CM

## 2016-01-06 DIAGNOSIS — R42 Dizziness and giddiness: Secondary | ICD-10-CM | POA: Diagnosis not present

## 2016-01-06 DIAGNOSIS — R5383 Other fatigue: Secondary | ICD-10-CM

## 2016-01-06 DIAGNOSIS — K219 Gastro-esophageal reflux disease without esophagitis: Secondary | ICD-10-CM | POA: Diagnosis not present

## 2016-01-06 DIAGNOSIS — R1310 Dysphagia, unspecified: Secondary | ICD-10-CM | POA: Diagnosis not present

## 2016-01-06 DIAGNOSIS — R51 Headache: Secondary | ICD-10-CM | POA: Diagnosis not present

## 2016-01-06 DIAGNOSIS — R531 Weakness: Secondary | ICD-10-CM | POA: Diagnosis not present

## 2016-01-06 DIAGNOSIS — G35 Multiple sclerosis: Secondary | ICD-10-CM

## 2016-01-06 DIAGNOSIS — Z79899 Other long term (current) drug therapy: Secondary | ICD-10-CM | POA: Insufficient documentation

## 2016-01-06 DIAGNOSIS — Z8659 Personal history of other mental and behavioral disorders: Secondary | ICD-10-CM | POA: Insufficient documentation

## 2016-01-06 LAB — COMPREHENSIVE METABOLIC PANEL
ALT: 9 U/L — ABNORMAL LOW (ref 14–54)
ANION GAP: 14 (ref 5–15)
AST: 15 U/L (ref 15–41)
Albumin: 4.3 g/dL (ref 3.5–5.0)
Alkaline Phosphatase: 80 U/L (ref 38–126)
BILIRUBIN TOTAL: 0.7 mg/dL (ref 0.3–1.2)
BUN: 6 mg/dL (ref 6–20)
CO2: 21 mmol/L — ABNORMAL LOW (ref 22–32)
Calcium: 9.7 mg/dL (ref 8.9–10.3)
Chloride: 103 mmol/L (ref 101–111)
Creatinine, Ser: 0.84 mg/dL (ref 0.44–1.00)
GFR calc Af Amer: >60 mL/min (ref >60)
Glucose, Bld: 69 mg/dL (ref 65–99)
POTASSIUM: 3.6 mmol/L (ref 3.5–5.1)
Sodium: 138 mmol/L (ref 135–145)
TOTAL PROTEIN: 8.1 g/dL (ref 6.5–8.1)

## 2016-01-06 LAB — CBC WITH DIFFERENTIAL/PLATELET
BASOS ABS: 0 10*3/uL (ref 0.0–0.1)
Basophils Relative: 0 %
EOS PCT: 1 %
Eosinophils Absolute: 0 10*3/uL (ref 0.0–0.7)
HCT: 41.7 % (ref 36.0–46.0)
Hemoglobin: 14.1 g/dL (ref 12.0–15.0)
LYMPHS PCT: 23 %
Lymphs Abs: 1.7 10*3/uL (ref 0.7–4.0)
MCH: 26.5 pg (ref 26.0–34.0)
MCHC: 33.8 g/dL (ref 30.0–36.0)
MCV: 78.2 fL (ref 78.0–100.0)
MONO ABS: 0.3 10*3/uL (ref 0.1–1.0)
Monocytes Relative: 4 %
Neutro Abs: 5.6 10*3/uL (ref 1.7–7.7)
Neutrophils Relative %: 72 %
PLATELETS: 350 10*3/uL (ref 150–400)
RBC: 5.33 MIL/uL — ABNORMAL HIGH (ref 3.87–5.11)
RDW: 13.8 % (ref 11.5–15.5)
WBC: 7.7 10*3/uL (ref 4.0–10.5)

## 2016-01-06 MED ORDER — SODIUM CHLORIDE 0.9 % IV BOLUS (SEPSIS): 1000.0000 mL | Freq: Once | INTRAVENOUS | Status: DC

## 2016-01-06 NOTE — ED Provider Notes (Signed)
The patient is a 20 year old female, she reports over a month of feeling like her throat is numb and she cannot feel the food when she swallows, she also has some difficulty with weakness in her arms or legs, she reports that sometimes her hands will go numb and fall asleep, sometimes this happens in her legs, she has no visual difficulties. Exam the patient has a normal cardiac and pulmonary exam, she hasn't soft nontender abdomen, no peripheral edema, she is able to straight leg raise bilaterally with 5 out of 5 strength, her grips are 5 out of 5, her finger-nose-finger test is intact, sensation is intact, speech is intact, cranial nerves III through XII are intact. MRI has been ordered to further rule out sources of neuro causes of the patient's symptoms.   EKG Interpretation  Date/Time:  Friday January 06 2016 19:06:55 EDT Ventricular Rate:  105 PR Interval:  83 QRS Duration: 79 QT Interval:  346 QTC Calculation: 457 R Axis:   68 Text Interpretation:  Sinus tachycardia Baseline wander in lead(s) II III aVR aVF V3 No old tracing to compare Confirmed by Necola Bluestein  MD, Elvina Bosch ([id number]) on 01/06/2016 7:10:20 PM       Medical screening examination/treatment/procedure(s) were conducted as a shared visit with non-physician practitioner(s) and myself.  I personally evaluated the patient during the encounter.  Clinical Impression:   Final diagnoses:  Weakness  Body aches  Other fatigue         Eber Hong, MD 01/07/16 1245

## 2016-01-06 NOTE — Discharge Instructions (Signed)
You have been seen today for weakness, trouble swallowing, fatigue, and body aches. Your imaging and lab tests showed no abnormalities. Use ibuprofen or naproxen for pain. Follow up with PCP as soon as possible for reevaluation and chronic management. Return to ED should symptoms worsen.

## 2016-01-06 NOTE — ED Provider Notes (Signed)
CSN: [id number]     Arrival date & time 01/06/16  1731 History  By signing my name below, I, Budd Palmer, attest that this documentation has been prepared under the direction and in the presence of Ayjah Show, PA-C. Electronically Signed: Budd Palmer, ED Scribe. 01/06/2016. 6:13 PM.    Chief Complaint  Patient presents with  . Muscle Pain   The history is provided by the patient, a parent and a relative. No language interpreter was used.   HPI Comments: Bethany Barrera is a 20 y.o. female with a PMHx of gastritis, GERD, and anxiety who presents to the Emergency Department complaining of constant, worsening, generalized myalgias accompanied by difficulty swallowing onset 1 month ago. Pt states she feels as though it is difficult to use her muscles without having spasms. Patient also endorses generalized weakness, fatigue, and some shortness of breath over the same time. Patient has been evaluated for similar complaints of at least 5 times in the last month and a half.   She denies a FHx of neurological issues. Patient denies fever/chills, nausea/vomiting, unilateral weakness, history of HIV or IV drug use, or any other complaints. Pt is allergic to tramadol and sulfa-antibiotics.  Past Medical History  Diagnosis Date  . GERD (gastroesophageal reflux disease)   . Anxiety   . Allergy   . Gastritis    History reviewed. No pertinent past surgical history. Family History  Problem Relation Age of Onset  . Clotting disorder Maternal Grandfather   . Diabetes Maternal Grandfather   . Heart disease Maternal Grandfather   . Breast cancer Paternal Grandmother   . Diabetes Mother   . Other Mother     gallstones  . Other Sister     pre-diabetes  . Other Sister     gallstones   Social History  Substance Use Topics  . Smoking status: Never Smoker   . Smokeless tobacco: Never Used  . Alcohol Use: No   OB History    No data available     Review of Systems  Constitutional: Positive  for appetite change and fatigue.  HENT: Positive for trouble swallowing.   Respiratory: Negative for cough and chest tightness.   Cardiovascular: Negative for chest pain.  Gastrointestinal: Negative for nausea, vomiting and abdominal pain.  Musculoskeletal: Positive for myalgias and neck pain.  Neurological: Positive for weakness, light-headedness and headaches.  All other systems reviewed and are negative.   Allergies  Sulfa antibiotics and Tramadol  Home Medications   Prior to Admission medications   Medication Sig Start Date End Date Taking? Authorizing Provider  cyclobenzaprine (FLEXERIL) 10 MG tablet Take 10 mg by mouth 2 (two) times daily as needed. 12/08/15  Yes Historical Provider, MD  doxycycline (VIBRAMYCIN) 100 MG capsule Take 1 capsule (100 mg total) by mouth 2 (two) times daily. One po bid x 7 days 01/04/16  Yes Tomasita Crumble, MD  esomeprazole (NEXIUM) 40 MG capsule Take 40 mg by mouth daily. 12/22/15  Yes Historical Provider, MD  MONO-LINYAH 0.25-35 MG-MCG tablet Take 1 tablet by mouth daily. 03/01/15  Yes Historical Provider, MD  ondansetron (ZOFRAN) 4 MG tablet Take 4 mg by mouth every 8 (eight) hours as needed for nausea or vomiting.   Yes Historical Provider, MD  polyethylene glycol (MIRALAX / GLYCOLAX) packet Take 17 g by mouth daily as needed for moderate constipation.   Yes Historical Provider, MD   BP 116/83 mmHg  Pulse 84  Temp(Src) 98.2 F (36.8 C) (Oral)  Resp 19  SpO2 100%  LMP 12/29/2015 Physical Exam  Constitutional: She is oriented to person, place, and time. She appears well-developed and well-nourished. No distress.  HENT:  Head: Normocephalic and atraumatic.  Mouth/Throat: Oropharynx is clear and moist. No oropharyngeal exudate.  Swallow reflex intact, but appears weakened. Patient able to readily handle secretions.  Eyes: Conjunctivae and EOM are normal. Pupils are equal, round, and reactive to light. Right eye exhibits no discharge. Left eye exhibits no  discharge. No scleral icterus.  Neck: Normal range of motion. Neck supple. No JVD present. No thyromegaly present.  Cardiovascular: Normal rate, regular rhythm, normal heart sounds and intact distal pulses.  Exam reveals no gallop and no friction rub.   No murmur heard. Pulmonary/Chest: Effort normal and breath sounds normal. No respiratory distress. She has no wheezes. She has no rales.  Abdominal: Soft. Bowel sounds are normal. She exhibits no distension and no mass. There is no tenderness.  Musculoskeletal: Normal range of motion. She exhibits no edema or tenderness.  Lymphadenopathy:    She has no cervical adenopathy.  Neurological: She is alert and oriented to person, place, and time. She has normal reflexes. Coordination normal.  Skin: Skin is warm and dry. No rash noted. She is not diaphoretic. No erythema.  Psychiatric: She has a normal mood and affect. Her behavior is normal.  Nursing note and vitals reviewed.   ED Course  Procedures  DIAGNOSTIC STUDIES: Oxygen Saturation is 100% on RA, normal by my interpretation.    COORDINATION OF CARE: 6:00 PM - Discussed normal neurological exam, normal vitals, and plans to consult with attending physician. Advised to f/u with PCP or obtain a second opinion from a separate PCP for long-term f/u. Pt advised of plan for treatment and pt agrees.   6:11 PM - Discussed plans to order an MRI to r/o MS, as well as to move pt to an ED bed. Pt advised of plan for treatment and pt agrees.   Labs Review Labs Reviewed  CBC WITH DIFFERENTIAL/PLATELET - Abnormal; Notable for the following:    RBC 5.33 (*)    All other components within normal limits  COMPREHENSIVE METABOLIC PANEL - Abnormal; Notable for the following:    CO2 21 (*)    ALT 9 (*)    All other components within normal limits    Imaging Review Mr Brain Wo Contrast  01/06/2016  CLINICAL DATA:  Worsening generalized myalgias with difficulty swallowing for 1 month. Evaluation for  multiple sclerosis. EXAM: MRI HEAD WITHOUT CONTRAST TECHNIQUE: Multiplanar, multiecho pulse sequences of the brain and surrounding structures were obtained without intravenous contrast. COMPARISON:  07/07/2014 FINDINGS: There is no evidence of acute infarct, intracranial hemorrhage, mass, midline shift, or extra-axial fluid collection. Ventricles and sulci are normal. The brain is normal in signal without evidence of demyelinating disease. Orbits are unremarkable. No fluid in the paranasal sinuses are mastoid air cells. Major intracranial vascular flow voids are preserved. IMPRESSION: Unremarkable appearance of the brain. Electronically Signed   By: Sebastian Ache M.D.   On: 01/06/2016 21:23   I have personally reviewed and evaluated these images and lab results as part of my medical decision-making.   EKG Interpretation   Date/Time:  Friday January 06 2016 19:06:55 EDT Ventricular Rate:  105 PR Interval:  83 QRS Duration: 79 QT Interval:  346 QTC Calculation: 457 R Axis:   68 Text Interpretation:  Sinus tachycardia Baseline wander in lead(s) II III  aVR aVF V3 No old tracing to compare Confirmed  by Hyacinth Meeker  MD, BRIAN  [phone]) on 01/06/2016 7:10:20 PM      MDM   Final diagnoses:  Weakness  Body aches  Other fatigue    Bethany Barrera presents with generalized weakness, fatigue, difficulty swallowing, and generalized muscle aches for the past month.  Findings and plan of care discussed with Eber Hong, MD. Dr. Hyacinth Meeker personally evaluated and examined this patient.  Although this patient's presentation could be due to too something benign, it does give some suspicion for early MS. Patient has been evaluated multiple times for similar complaints over the last 4-6 weeks. Patient was seen by her PCP today and was sent to the ED for further evaluation. MRI without contrast for rule out MS. No abnormalities on MRI. Patient voiced improvement while here in the ED. Patient is readily able to  pass a swallow test with both liquids and solids. Patient advised to follow-up with PCP or select another PCP for a second opinion. Home care and return precautions discussed. Patient voiced understanding of these instructions, agreed to the plan, and is comfortable with discharge.  Filed Vitals:   01/06/16 2145 01/06/16 2200 01/06/16 2215 01/06/16 2230  BP: 121/75 115/68 119/90 116/83  Pulse: 87 83 84 84  Temp:      TempSrc:      Resp: SpO2: 100% 100% 100% 100%     I personally performed the services described in this documentation, which was scribed in my presence. The recorded information has been reviewed and is accurate.    Anselm Pancoast, PA-C 01/06/16 2250  Eber Hong, MD 01/07/16 1245

## 2016-01-06 NOTE — ED Notes (Signed)
The pt has had muscle aches for a molnths and she has been seen here x2 previously  Intermittent temp.  She also has chest and throat intermittently lmp  3-4 days ago

## 2016-01-06 NOTE — ED Notes (Signed)
Intermittent dizziness and numbness

## 2016-01-11 ENCOUNTER — Encounter: Payer: Self-pay | Admitting: Neurology

## 2016-01-11 ENCOUNTER — Ambulatory Visit (INDEPENDENT_AMBULATORY_CARE_PROVIDER_SITE_OTHER): Payer: Commercial Managed Care - HMO | Admitting: Neurology

## 2016-01-11 VITALS — BP 122/68 | HR 100

## 2016-01-11 DIAGNOSIS — R202 Paresthesia of skin: Secondary | ICD-10-CM | POA: Diagnosis not present

## 2016-01-11 DIAGNOSIS — R531 Weakness: Secondary | ICD-10-CM

## 2016-01-11 NOTE — Progress Notes (Signed)
PATIENT: Bethany Barrera DOB: 08/12/1996  Chief Complaint  Patient presents with  . Muscle spasms/weakness    She is here with her mother.  Reports worsening of full body muscle strength over the last month.  She is having difficulty swallowing.  She has been experiencing extreme fatigue.     HISTORICAL  Bethany Barrera is a 20 years old right-handed female, accompanied by her mother, seen in refer by her primary care physician Dr. Elfredia Nevins in January 11 2016 for evaluation of muscle weakness, spasm, shaking, rapid heartbeat, throat pain.  She was previously healthy, since February 2017, she began to develop constellation of the symptoms, she had low pelvic area pain, was recently diagnosed with inflammatory pelvic disease, is treated with doxycycline, in addition, she complains of generalized weakness, difficulty using her arms, difficulty ambulating, heart palpitation, difficulty swallowing, sometimes she is show anxious nervous, her whole body was shaking, she has to remind herself to breathe,   She has been treated by GI physician for her complains of acid reflux recently,   I reviewed Laboratory evaluation since March 2017, normal CBC, CMP, TSH, negative HIV, RPR, pap smear is negative for gonorrhea, chlamydia,  I personally reviewed MRI of the brain without contrast March 2017 that was normal   REVIEW OF SYSTEMS: Full 14 system review of systems performed and notable only for weight loss, fatigue, chest pain, trouble swallowing, shortness of breath, diarrhea, feeling cold, joint pain, achy muscles, numbness, diifficulty swallowing, dizziness, decreased energy. ALLERGIES: Allergies  Allergen Reactions  . Sulfa Antibiotics Shortness Of Breath and Other (See Comments)    tremors  . Tramadol Other (See Comments)    headaches    HOME MEDICATIONS: Current Outpatient Prescriptions  Medication Sig Dispense Refill  . cyclobenzaprine (FLEXERIL) 10 MG tablet Take 10 mg by  mouth 2 (two) times daily as needed.  0  . doxycycline (VIBRAMYCIN) 100 MG capsule Take 1 capsule (100 mg total) by mouth 2 (two) times daily. One po bid x 7 days 14 capsule 0  . esomeprazole (NEXIUM) 40 MG capsule Take 40 mg by mouth daily.  1  . MONO-LINYAH 0.25-35 MG-MCG tablet Take 1 tablet by mouth daily.    . ondansetron (ZOFRAN) 4 MG tablet Take 4 mg by mouth every 8 (eight) hours as needed for nausea or vomiting.    . polyethylene glycol (MIRALAX / GLYCOLAX) packet Take 17 g by mouth daily as needed for moderate constipation.    . [DISCONTINUED] hyoscyamine (LEVSIN SL) 0.125 MG SL tablet Place 1 tablet (0.125 mg total) under the tongue every 8 (eight) hours as needed for cramping. (Patient not taking: Reported on 01/04/2016) 60 tablet 2  . [DISCONTINUED] omeprazole (PRILOSEC) 20 MG capsule Take 1 capsule (20 mg total) by mouth 2 (two) times daily before a meal. (Patient not taking: Reported on 01/04/2016) 30 capsule 0  . [DISCONTINUED] ranitidine (ZANTAC) 150 MG tablet Take 1 tablet (150 mg total) by mouth 2 (two) times daily. (Patient not taking: Reported on 01/04/2016) 60 tablet 2  . [DISCONTINUED] sucralfate (CARAFATE) 1 g tablet Take 1 tablet (1 g total) by mouth 4 (four) times daily -  with meals and at bedtime. (Patient not taking: Reported on 01/04/2016) 30 tablet 0   No current facility-administered medications for this visit.    PAST MEDICAL HISTORY: Past Medical History  Diagnosis Date  . GERD (gastroesophageal reflux disease)   . Anxiety   . Gastritis   . Seasonal allergies   .  Muscle weakness     PAST SURGICAL HISTORY: Past Surgical History  Procedure Laterality Date  . No past surgeries      FAMILY HISTORY: Family History  Problem Relation Age of Onset  . Clotting disorder Maternal Grandfather   . Diabetes Maternal Grandfather   . Heart disease Maternal Grandfather   . Breast cancer Paternal Grandmother   . Diabetes Mother   . Other Mother     gallstones  .  Other Sister     pre-diabetes  . Other Sister     gallstones  . Hypertension Mother   . Heart disease Mother   . Hyperlipidemia Father     SOCIAL HISTORY:  Social History   Social History  . Marital Status: Single    Spouse Name: N/A  . Number of Children: 0  . Years of Education: 11   Occupational History  . Student    Social History Main Topics  . Smoking status: Never Smoker   . Smokeless tobacco: Never Used  . Alcohol Use: No  . Drug Use: No  . Sexual Activity: Yes    Birth Control/ Protection: Pill   Other Topics Concern  . Not on file   Social History Narrative   Lives at home with mother and father.   Right-handed.   No caffeine use.     PHYSICAL EXAM   Filed Vitals:   01/11/16 1134  BP: 122/68  Pulse: 88    Not recorded      There is no weight on file to calculate BMI.  PHYSICAL EXAMNIATION:  Gen: NAD, conversant, well nourised, obese, well groomed                     Cardiovascular: Regular rate rhythm, no peripheral edema, warm, nontender. Eyes: Conjunctivae clear without exudates or hemorrhage Neck: Supple, no carotid bruise. Pulmonary: Clear to auscultation bilaterally   NEUROLOGICAL EXAM:  MENTAL STATUS: Speech:    Speech is normal; fluent and spontaneous with normal comprehension.  Cognition:     Orientation to time, place and person     Normal recent and remote memory     Normal Attention span and concentration     Normal Language, naming, repeating,spontaneous speech     Fund of knowledge   CRANIAL NERVES: CN II: Visual fields are full to confrontation. Fundoscopic exam is normal with sharp discs and no vascular changes. Pupils are round equal and briskly reactive to light. CN III, IV, VI: extraocular movement are normal. No ptosis. CN V: Facial sensation is intact to pinprick in all 3 divisions bilaterally. Corneal responses are intact.  CN VII: Face is symmetric with normal eye closure and smile. CN VIII: Hearing is  normal to rubbing fingers CN IX, X: Palate elevates symmetrically. Phonation is normal. CN XI: Head turning and shoulder shrug are intact CN XII: Tongue is midline with normal movements and no atrophy.  MOTOR: There is no pronator drift of out-stretched arms. Muscle bulk and tone are normal. Muscle strength is normal.  REFLEXES: Reflexes are 2+ and symmetric at the biceps, triceps, knees, and ankles. Plantar responses are flexor.  SENSORY: Intact to light touch, pinprick, positional sensation and vibratory sensation are intact in fingers and toes.  COORDINATION: Rapid alternating movements and fine finger movements are intact. There is no dysmetria on finger-to-nose and heel-knee-shin.    GAIT/STANCE: Posture is normal. Gait is steady with normal steps, base, arm swing, and turning. Heel and toe walking are normal. Tandem  gait is normal.  Romberg is absent.   DIAGNOSTIC DATA (LABS, IMAGING, TESTING) - I reviewed patient records, labs, notes, testing and imaging myself where available.   ASSESSMENT AND PLAN  NAYVIE LIPS is a 20 y.o. female   With constellation of complaints, including generalized weakness, difficulty breathing  Laboratory evaluation including inflammatory markers, acetylcholine receptor antibody, thyroid functional test to rule out inflammatory muscle disease, neuromuscular junctional disorder  EMG nerve conduction study   Levert Feinstein, M.D. Ph.D.  [facility] [address], [address] [place], [place] [address] Ph: [phone] Fax: [phone]  [id number] Sherwood Gambler, MD

## 2016-01-12 ENCOUNTER — Telehealth: Payer: Self-pay | Admitting: Neurology

## 2016-01-12 ENCOUNTER — Encounter: Payer: Self-pay | Admitting: *Deleted

## 2016-01-12 LAB — SEDIMENTATION RATE: SED RATE: 12 mm/h (ref 0–32)

## 2016-01-12 LAB — ANA W/REFLEX IF POSITIVE: Anti Nuclear Antibody(ANA): NEGATIVE

## 2016-01-12 LAB — VITAMIN D 25 HYDROXY (VIT D DEFICIENCY, FRACTURES): Vit D, 25-Hydroxy: 7.5 ng/mL — ABNORMAL LOW (ref 30.0–100.0)

## 2016-01-12 LAB — ACETYLCHOLINE RECEPTOR, BINDING

## 2016-01-12 LAB — CK: Total CK: 38 U/L (ref 24–173)

## 2016-01-12 LAB — MAGNESIUM: MAGNESIUM: 1.9 mg/dL (ref 1.6–2.3)

## 2016-01-12 LAB — C-REACTIVE PROTEIN: CRP: 29.3 mg/L — ABNORMAL HIGH (ref 0.0–4.9)

## 2016-01-12 NOTE — Telephone Encounter (Signed)
Spoke to patient - she is aware of lab results. She wrote down the ordered supplements and read them back to me.

## 2016-01-12 NOTE — Telephone Encounter (Signed)
Please call patient, laboratory evaluation showed severely decreased vitamin D level 7.5, she needs over-the-counter vitamin D supplement, 1000 units 3 tablets every day, repeat level in 3 months.  She should also consider calcium supplement, at least 100 mg daily.  Mild elevated C reactive protein, is likely related to her inflammatory pelvic disease, rest of the laboratory evaluation was normal.

## 2016-01-16 ENCOUNTER — Encounter: Payer: Self-pay | Admitting: *Deleted

## 2016-01-16 NOTE — Telephone Encounter (Signed)
Patient is calling back and states that she could only find 250 mg calcium and would like to know if OK to take that dosage.  Please call.

## 2016-01-16 NOTE — Telephone Encounter (Signed)
See Previous Encounter

## 2016-01-16 NOTE — Telephone Encounter (Signed)
She wanted to let us know she has purchased the calcium 250mg  and is taking one daily - noted in chart.

## 2016-01-23 ENCOUNTER — Telehealth: Payer: Self-pay | Admitting: Internal Medicine

## 2016-01-23 ENCOUNTER — Ambulatory Visit: Payer: Commercial Managed Care - HMO | Admitting: Internal Medicine

## 2016-01-23 NOTE — Telephone Encounter (Signed)
No show letter created and mailed to patient. 

## 2016-01-24 ENCOUNTER — Encounter: Payer: Self-pay | Admitting: *Deleted

## 2016-01-24 ENCOUNTER — Ambulatory Visit (INDEPENDENT_AMBULATORY_CARE_PROVIDER_SITE_OTHER): Payer: Commercial Managed Care - HMO | Admitting: Advanced Practice Midwife

## 2016-01-24 ENCOUNTER — Encounter: Payer: Self-pay | Admitting: Advanced Practice Midwife

## 2016-01-24 ENCOUNTER — Telehealth: Payer: Self-pay | Admitting: Advanced Practice Midwife

## 2016-01-24 VITALS — BP 118/80 | HR 106 | Ht 65.0 in | Wt 191.0 lb

## 2016-01-24 DIAGNOSIS — Z113 Encounter for screening for infections with a predominantly sexual mode of transmission: Secondary | ICD-10-CM

## 2016-01-24 DIAGNOSIS — R5382 Chronic fatigue, unspecified: Secondary | ICD-10-CM

## 2016-01-24 DIAGNOSIS — N898 Other specified noninflammatory disorders of vagina: Secondary | ICD-10-CM

## 2016-01-24 MED ORDER — NAPROXEN 500 MG PO TABS: 500.0000 mg | ORAL_TABLET | Freq: Two times a day (BID) | ORAL | Status: DC

## 2016-01-24 MED ORDER — DOXYCYCLINE HYCLATE 100 MG PO CAPS
100.0000 mg | ORAL_CAPSULE | Freq: Two times a day (BID) | ORAL | Status: DC
Start: 2016-01-24 — End: 2016-02-02

## 2016-01-24 NOTE — Telephone Encounter (Signed)
Spoke to patient - she is taking calcium supplement as stated below - noted in her med list.

## 2016-01-24 NOTE — Addendum Note (Signed)
Addended by: Jacklyn Shell on: 01/24/2016 04:53 PM   Modules accepted: Orders

## 2016-01-24 NOTE — Telephone Encounter (Signed)
Tramadol 50mg  # 15 called in to [facility]

## 2016-01-24 NOTE — Telephone Encounter (Signed)
Pt aware Naprosyn was sent to pharmacy. JSY

## 2016-01-24 NOTE — Addendum Note (Signed)
Addended by: Criss Alvine on: 01/24/2016 11:15 AM   Modules accepted: Orders, Medications

## 2016-01-24 NOTE — Telephone Encounter (Signed)
Pt said she the calcium 250 was gummies and she is having difficulty chewing them. She has found calcium  capsules that she could swallow. She is asking if this would be ok.

## 2016-01-24 NOTE — Progress Notes (Signed)
[facility] Visit  Patient name: Bethany Barrera MRN [id number]  Date of birth: 1996/07/29  CC & HPI:  Bethany Barrera is a 20 y.o. African American female presenting today for c/o green discharge for about a week, with accompanied "vaginal discomfort" She has had pelvic and abdominal pain for about a month. No fever.  She also has seen several MDs regarding fatigue, muscle weakness, hx sore throat for over a month.  No dx has been made.  Vit D level was 7.5, on supplement. HIV negative.   Pertinent History Reviewed:  Medical & Surgical Hx:   Past Medical History  Diagnosis Date  . GERD (gastroesophageal reflux disease)   . Anxiety   . Gastritis   . Seasonal allergies   . Muscle weakness    Past Surgical History  Procedure Laterality Date  . No past surgeries     Family History  Problem Relation Age of Onset  . Clotting disorder Maternal Grandfather   . Diabetes Maternal Grandfather   . Heart disease Maternal Grandfather   . Breast cancer Paternal Grandmother   . Diabetes Mother   . Other Mother     gallstones  . Other Sister     pre-diabetes  . Other Sister     gallstones  . Hypertension Mother   . Heart disease Mother   . Hyperlipidemia Father     Current outpatient prescriptions:  .  Calcium 250 MG CAPS, Take 250 mg by mouth daily., Disp: , Rfl:  .  Cholecalciferol (VITAMIN D PO), Take 3,000 Units by mouth daily., Disp: , Rfl:  .  doxycycline (VIBRAMYCIN) 100 MG capsule, Take 1 capsule (100 mg total) by mouth 2 (two) times daily., Disp: 14 capsule, Rfl: 0 .  esomeprazole (NEXIUM) 40 MG capsule, Take 40 mg by mouth daily., Disp: , Rfl: 1 .  MONO-LINYAH 0.25-35 MG-MCG tablet, Take 1 tablet by mouth daily., Disp: , Rfl:  .  ondansetron (ZOFRAN) 4 MG tablet, Take 4 mg by mouth every 8 (eight) hours as needed for nausea or vomiting., Disp: , Rfl:  .  polyethylene glycol (MIRALAX / GLYCOLAX) packet, Take 17 g by mouth daily as needed for moderate  constipation., Disp: , Rfl:  .  [DISCONTINUED] hyoscyamine (LEVSIN SL) 0.125 MG SL tablet, Place 1 tablet (0.125 mg total) under the tongue every 8 (eight) hours as needed for cramping. (Patient not taking: Reported on 01/04/2016), Disp: 60 tablet, Rfl: 2 .  [DISCONTINUED] omeprazole (PRILOSEC) 20 MG capsule, Take 1 capsule (20 mg total) by mouth 2 (two) times daily before a meal. (Patient not taking: Reported on 01/04/2016), Disp: 30 capsule, Rfl: 0 .  [DISCONTINUED] ranitidine (ZANTAC) 150 MG tablet, Take 1 tablet (150 mg total) by mouth 2 (two) times daily. (Patient not taking: Reported on 01/04/2016), Disp: 60 tablet, Rfl: 2 .  [DISCONTINUED] sucralfate (CARAFATE) 1 g tablet, Take 1 tablet (1 g total) by mouth 4 (four) times daily -  with meals and at bedtime. (Patient not taking: Reported on 01/04/2016), Disp: 30 tablet, Rfl: 0 Social History: Reviewed -  reports that she has never smoked. She has never used smokeless tobacco.  Review of Systems:   Systems neg other than noted in HPI  Objective Findings:    Physical Examination: General appearance - appears fatigued and in no distress Mental status - alert, oriented to person, place, and time Chest:  Normal respiratory effort Heart - normal rate and regular rhythm Abdomen:  Soft, nontender Pelvic: Thomas Hoff  heavy DC without odor.  Wet prep: TNTC WBC, no clue, trich or yeast Musculoskeletal:  Normal range of motion without pain.   Extremities:  No edema    No results found for this or any previous visit (from the past 24 hour(s)).    Assessment & Plan:  A:   Leukorrhea  Chronic fatique/muscle weakness P:  Doxycycline  BID X7; will add Flagyl if sx not improved (don't want to stress stomach too much now, sx not severe)   Test for mono and lyme disease     Return if symptoms worsen or fail to improve.  CRESENZO-DISHMAN,Erion Weightman CNM 01/24/2016 10:41 AM

## 2016-01-24 NOTE — Telephone Encounter (Signed)
Allergic to Tramadol--naprosyn called [facility]

## 2016-01-25 LAB — MONONUCLEOSIS SCREEN: MONO SCREEN: NEGATIVE

## 2016-01-25 LAB — B. BURGDORFI ANTIBODIES

## 2016-01-25 LAB — GC/CHLAMYDIA PROBE AMP
Chlamydia trachomatis, NAA: NEGATIVE
Neisseria gonorrhoeae by PCR: NEGATIVE

## 2016-01-25 LAB — EPSTEIN-BARR VIRUS NUCLEAR ANTIGEN ANTIBODY, IGG: EBV NA IgG: <18 U/mL (ref 0.0–17.9)

## 2016-01-26 ENCOUNTER — Other Ambulatory Visit: Payer: Self-pay | Admitting: Advanced Practice Midwife

## 2016-01-26 ENCOUNTER — Telehealth: Payer: Self-pay | Admitting: Advanced Practice Midwife

## 2016-01-26 MED ORDER — CIPROFLOXACIN HCL 500 MG PO TABS: 500.0000 mg | ORAL_TABLET | Freq: Two times a day (BID) | ORAL | Status: DC

## 2016-01-26 NOTE — Progress Notes (Signed)
Unpleasant response to doxy.  Sent rx for Cipro.  All labs neg

## 2016-01-26 NOTE — Telephone Encounter (Signed)
Pt states that she has had vomiting and feeling very weak. Pt states that she has had palpitations and shortness of breath since starting the doxycycline. I spoke with Drenda Freeze and she advised that she would send in a new Rx for the pt. Pt aware that she should check with her pharmacy a little later.

## 2016-01-31 ENCOUNTER — Ambulatory Visit (INDEPENDENT_AMBULATORY_CARE_PROVIDER_SITE_OTHER): Payer: Commercial Managed Care - HMO | Admitting: Neurology

## 2016-01-31 ENCOUNTER — Ambulatory Visit (INDEPENDENT_AMBULATORY_CARE_PROVIDER_SITE_OTHER): Payer: Self-pay | Admitting: Neurology

## 2016-01-31 DIAGNOSIS — R531 Weakness: Secondary | ICD-10-CM

## 2016-01-31 DIAGNOSIS — R202 Paresthesia of skin: Secondary | ICD-10-CM

## 2016-01-31 DIAGNOSIS — Z0289 Encounter for other administrative examinations: Secondary | ICD-10-CM

## 2016-01-31 MED ORDER — DULOXETINE HCL 60 MG PO CPEP: 60.0000 mg | ORAL_CAPSULE | Freq: Every day | ORAL | Status: DC

## 2016-01-31 NOTE — Procedures (Signed)
   NCS (NERVE CONDUCTION STUDY) WITH EMG (ELECTROMYOGRAPHY) REPORT   STUDY DATE: January 31 2016 PATIENT NAME: Bethany Barrera DOB: April 13, 1996 MRN: [id number]    TECHNOLOGIST: Gearldine Shown ELECTROMYOGRAPHER: Levert Feinstein M.D.  CLINICAL INFORMATION:  20 years old female, complains few months history of generalized weakness  On examination: Bilateral upper and lower extremity motor strength were normal with encouragement, there was no sensory loss, deep tendon reflexes were normal and symmetric.  FINDINGS: NERVE CONDUCTION STUDY: Bilateral peroneal sensory responses were normal. Right peroneal to EDB, right tibial motor responses were normal. Right tibial H reflexes were present.  Left median, ulnar sensory and motor responses were normal.  NEEDLE ELECTROMYOGRAPHY: Selective needle examinations were performed at right upper, lower extremity muscles, right cervical, and lumbar sacral paraspinal muscles.  Needle examination of right pronator teres, flexor carpi ulnaris, first dorsal interossei, biceps, triceps, deltoid was normal,  There was no spontaneous activity at right cervical paraspinal muscles, right C5, 6, 7.  Needle examination of right tibialis anterior, tibialis posterior, medial gastrocnemius, peroneal longus, vastus lateralis, iliopsoas, right gluteus medius muscle was normal.  There was no spontaneous activity at right lumbosacral paraspinals, right L4, L5, S1.  We also performed repetitive nerve stimulation, stimulating at distal left ulnar nerve, recording at left abductor digiti minimi, there was no decrementing response at baseline; 30 seconds, one minute, 2 minutes, 3 minutes, 4 minutes after exertion.  There was no abnormality found on the repetitive nerve stimulation to suggestive of neuromuscular junctional disorder.  IMPRESSION:   This is a normal study. There is no electrodiagostic evidence of right cervical, right lumbar sacral radiculopathy. There is no  evidence of large fiber peripheral neuropathy or inflammatory myopathy.  There was no abnormality found on the repetitive nerve stimulation to suggestive of neuromuscular junctional disorder.     INTERPRETING PHYSICIAN:   Levert Feinstein M.D. Ph.D. [facility] [address] [place], [place] [address] [phone]

## 2016-01-31 NOTE — Progress Notes (Signed)
Electrodiagnostic study today is normal, there is no evidence of left cervical, left lumbosacral radiculopathy, there is no evidence of large fiber peripheral neuropathy, or inflammatory myopathic changes. Repetitive nerve stimulation was also performed, stimulating at distal left ulnar nerve, recording at left abductor digiti minimi, there is no evidence of neuromuscular junctional disorder. Please see separate report under procedure

## 2016-02-02 ENCOUNTER — Ambulatory Visit (HOSPITAL_COMMUNITY)
Admission: RE | Admit: 2016-02-02 | Discharge: 2016-02-02 | Disposition: A | Discharge status: Home / Self Care | Payer: Commercial Managed Care - HMO | Source: Ambulatory Visit | Attending: Internal Medicine | Admitting: Internal Medicine

## 2016-02-02 ENCOUNTER — Telehealth: Payer: Self-pay | Admitting: Neurology

## 2016-02-02 ENCOUNTER — Other Ambulatory Visit: Payer: Self-pay | Admitting: Advanced Practice Midwife

## 2016-02-02 ENCOUNTER — Other Ambulatory Visit (HOSPITAL_COMMUNITY): Payer: Self-pay | Admitting: Internal Medicine

## 2016-02-02 ENCOUNTER — Telehealth: Payer: Self-pay | Admitting: Advanced Practice Midwife

## 2016-02-02 DIAGNOSIS — R0603 Acute respiratory distress: Secondary | ICD-10-CM

## 2016-02-02 DIAGNOSIS — R0600 Dyspnea, unspecified: Secondary | ICD-10-CM | POA: Diagnosis not present

## 2016-02-02 MED ORDER — OFLOXACIN 400 MG PO TABS: 400.0000 mg | ORAL_TABLET | Freq: Two times a day (BID) | ORAL | Status: DC

## 2016-02-02 NOTE — Progress Notes (Signed)
Pt having trouble taking cipro; also quit taking doxycycline.  Requests ofloxin instead; given pt's noncompliance w/ other meds, will rx

## 2016-02-02 NOTE — Telephone Encounter (Signed)
For what?

## 2016-02-02 NOTE — Telephone Encounter (Signed)
I talked with her gynecologist Dr. Sherwood Gambler, reviewed neurological evaluations, normal MRI of the brain, laboratory evaluations include CPK, thyroid functional test, negative acetylcholine receptor antibody, normal EMG nerve conduction study, includes normal repetitive nerve stimulation.  I have started her on Cymbalta 60 mg daily,

## 2016-02-02 NOTE — Telephone Encounter (Signed)
Pt states taking Ciprofloxacin for pelvic inflammatory disease, not helping and causing her "heart to race", requesting "Ofloxacin" instead, has taken this med in the past.

## 2016-02-07 ENCOUNTER — Other Ambulatory Visit: Payer: Self-pay | Admitting: Advanced Practice Midwife

## 2016-02-08 ENCOUNTER — Other Ambulatory Visit: Payer: Self-pay | Admitting: Women's Health

## 2016-02-08 ENCOUNTER — Telehealth: Payer: Self-pay | Admitting: Advanced Practice Midwife

## 2016-02-08 NOTE — Telephone Encounter (Signed)
[facility] was showing [facility] license has expired.  It hasn't, but they are having to "call corporate" to fix it. I asked Selena Batten to call in antibiotic.

## 2016-02-08 NOTE — Progress Notes (Signed)
Called in Floxin 400mg  BID x 7d to [facility] per request from Cathie Beams, CNM d/t being notified by [facility] that they are unable to fill under her name at this time as there system is telling them she is inactive. Pt states she's done well on Floxin before.  Cheral Marker, CNM, [facility] 02/08/2016 11:33 AM

## 2016-02-14 ENCOUNTER — Telehealth: Payer: Self-pay | Admitting: Neurology

## 2016-02-14 NOTE — Telephone Encounter (Signed)
Patient called, thinks she needs to be seen again due to having "really bad symptoms with muscles". States she went to [facility] and was advised to contact our office regarding other tests we could od for nerves and muscles.

## 2016-02-14 NOTE — Telephone Encounter (Signed)
Spoke to patient - she has accepted an appt on 02/15/16 to further discuss symptoms with Dr. Terrace Arabia.

## 2016-02-15 ENCOUNTER — Encounter: Payer: Self-pay | Admitting: Neurology

## 2016-02-15 ENCOUNTER — Ambulatory Visit (INDEPENDENT_AMBULATORY_CARE_PROVIDER_SITE_OTHER): Payer: Commercial Managed Care - HMO | Admitting: Neurology

## 2016-02-15 VITALS — BP 108/62 | HR 92 | Resp 14 | Ht 65.0 in | Wt 170.0 lb

## 2016-02-15 DIAGNOSIS — R202 Paresthesia of skin: Secondary | ICD-10-CM

## 2016-02-15 DIAGNOSIS — R531 Weakness: Secondary | ICD-10-CM | POA: Diagnosis not present

## 2016-02-15 MED ORDER — DULOXETINE HCL 30 MG PO CPEP: 30.0000 mg | ORAL_CAPSULE | Freq: Every day | ORAL | Status: DC

## 2016-02-15 NOTE — Progress Notes (Signed)
Chief Complaint  Patient presents with  . Extremity Weakness    Rm 4. F/U. Patient reports feeling increased weakness in her muscles. States that she cannot chew due to muscle weakness. She went to [facility] and they advised she f/u here.       PATIENT: Bethany Barrera DOB: [birth year]  Chief Complaint  Patient presents with  . Extremity Weakness    Rm 4. F/U. Patient reports feeling increased weakness in her muscles. States that she cannot chew due to muscle weakness. She went to [facility] and they advised she f/u here.      HISTORICAL  Bethany Barrera is a 20 years old right-handed female, accompanied by her mother, seen in refer by her primary care physician Dr. Elfredia Nevins in January 11 2016 for evaluation of muscle weakness, spasm, shaking, rapid heartbeat, throat pain.  She was previously healthy, since February 2017, she began to develop constellation of the symptoms, she had low pelvic area pain, was recently diagnosed with inflammatory pelvic disease, is treated with doxycycline, in addition, she complains of generalized weakness, difficulty using her arms, difficulty ambulating, heart palpitation, difficulty swallowing, sometimes she is show anxious nervous, her whole body was shaking, she has to remind herself to breathe,   She has been treated by GI physician for her complains of acid reflux recently,   I reviewed Laboratory evaluation since March 2017, normal CBC, CMP, TSH, negative HIV, RPR, pap smear is negative for gonorrhea, chlamydia,  I personally reviewed MRI of the brain without contrast March 2017 that was normal   UPDATE May 10th 2017: She is accompanied by her mother at today's clinical visit, she continue have significant generalized weakness, not feeling well difficulty swallowing, muscle achy pain, not motivated, she tried to take Cymbalta 60 mg only once, make her feel weird, she no longer takes it  We have reviewed extensive laboratory evaluations, evidence of  severe vitamin D deficiency, 7.5, elevated C reactive protein, 29, rest of the laboratories evaluation showed no significant abnormality, such as ANA, vitamin B12, folic acid, TSH, CPK,  EMG nerve conduction study was normal in April 2017   REVIEW OF SYSTEMS: Full 14 system review of systems performed and notable only for weight loss, fatigue, chest pain, trouble swallowing, shortness of breath, diarrhea, feeling cold, joint pain, achy muscles, numbness, diifficulty swallowing, dizziness, decreased energy. ALLERGIES: Allergies  Allergen Reactions  . Sulfa Antibiotics Shortness Of Breath and Other (See Comments)    tremors  . Tramadol Other (See Comments)    headaches    HOME MEDICATIONS: Current Outpatient Prescriptions  Medication Sig Dispense Refill  . Calcium 250 MG CAPS Take 500 mg by mouth daily.     . Cholecalciferol (VITAMIN D PO) Take 3,000 Units by mouth daily.    Marland Kitchen esomeprazole (NEXIUM) 40 MG capsule Take 40 mg by mouth daily.  1  . MONO-LINYAH 0.25-35 MG-MCG tablet Take 1 tablet by mouth daily.    Marland Kitchen ofloxacin (FLOXIN) 400 MG tablet Take 1 tablet (400 mg total) by mouth 2 (two) times daily. 14 tablet 0  . ondansetron (ZOFRAN) 4 MG tablet Take 4 mg by mouth every 8 (eight) hours as needed for nausea or vomiting.    . polyethylene glycol (MIRALAX / GLYCOLAX) packet Take 17 g by mouth daily as needed for moderate constipation. Reported on 01/24/2016    . [DISCONTINUED] hyoscyamine (LEVSIN SL) 0.125 MG SL tablet Place 1 tablet (0.125 mg total) under the tongue every 8 (eight) hours as  needed for cramping. (Patient not taking: Reported on 01/04/2016) 60 tablet 2  . [DISCONTINUED] omeprazole (PRILOSEC) 20 MG capsule Take 1 capsule (20 mg total) by mouth 2 (two) times daily before a meal. (Patient not taking: Reported on 01/04/2016) 30 capsule 0  . [DISCONTINUED] ranitidine (ZANTAC) 150 MG tablet Take 1 tablet (150 mg total) by mouth 2 (two) times daily. (Patient not taking: Reported on  01/04/2016) 60 tablet 2  . [DISCONTINUED] sucralfate (CARAFATE) 1 g tablet Take 1 tablet (1 g total) by mouth 4 (four) times daily -  with meals and at bedtime. (Patient not taking: Reported on 01/04/2016) 30 tablet 0   No current facility-administered medications for this visit.    PAST MEDICAL HISTORY: Past Medical History  Diagnosis Date  . GERD (gastroesophageal reflux disease)   . Anxiety   . Gastritis   . Seasonal allergies   . Muscle weakness     PAST SURGICAL HISTORY: Past Surgical History  Procedure Laterality Date  . No past surgeries      FAMILY HISTORY: Family History  Problem Relation Age of Onset  . Clotting disorder Maternal Grandfather   . Diabetes Maternal Grandfather   . Heart disease Maternal Grandfather   . Breast cancer Paternal Grandmother   . Diabetes Mother   . Other Mother     gallstones  . Other Sister     pre-diabetes  . Other Sister     gallstones  . Hypertension Mother   . Heart disease Mother   . Hyperlipidemia Father     SOCIAL HISTORY:  Social History   Social History  . Marital Status: Single    Spouse Name: N/A  . Number of Children: 0  . Years of Education: 11   Occupational History  . Student    Social History Main Topics  . Smoking status: Never Smoker   . Smokeless tobacco: Never Used  . Alcohol Use: No  . Drug Use: No  . Sexual Activity: Yes    Birth Control/ Protection: Pill   Other Topics Concern  . Not on file   Social History Narrative   Lives at home with mother and father.   Right-handed.   No caffeine use.     PHYSICAL EXAM   Filed Vitals:   02/15/16 0726  BP: 108/62  Pulse: 92  Resp: 14  Height: 5\' 5"  (1.651 m)  Weight: 170 lb (77.111 kg)    Not recorded      Body mass index is 28.29 kg/(m^2).  PHYSICAL EXAMNIATION:  Gen: NAD, conversant, well nourised, obese, well groomed                     Cardiovascular: Regular rate rhythm, no peripheral edema, warm, nontender. Eyes:  Conjunctivae clear without exudates or hemorrhage Neck: Supple, no carotid bruise. Pulmonary: Clear to auscultation bilaterally   NEUROLOGICAL EXAM:  MENTAL STATUS: Speech:    Speech is normal; fluent and spontaneous with normal comprehension.  Cognition:     Orientation to time, place and person     Normal recent and remote memory     Normal Attention span and concentration     Normal Language, naming, repeating,spontaneous speech     Fund of knowledge   CRANIAL NERVES: CN II: Visual fields are full to confrontation. Fundoscopic exam is normal with sharp discs and no vascular changes. Pupils are round equal and briskly reactive to light. CN III, IV, VI: extraocular movement are normal. No ptosis. CN V:  Facial sensation is intact to pinprick in all 3 divisions bilaterally. Corneal responses are intact.  CN VII: Face is symmetric with normal eye closure and smile. CN VIII: Hearing is normal to rubbing fingers CN IX, X: Palate elevates symmetrically. Phonation is normal. CN XI: Head turning and shoulder shrug are intact CN XII: Tongue is midline with normal movements and no atrophy.  MOTOR: There is no pronator drift of out-stretched arms. Muscle bulk and tone are normal. Muscle strength is normal.  REFLEXES: Reflexes are 2+ and symmetric at the biceps, triceps, knees, and ankles. Plantar responses are flexor.  SENSORY: Intact to light touch, pinprick, positional sensation and vibratory sensation are intact in fingers and toes.  COORDINATION: Rapid alternating movements and fine finger movements are intact. There is no dysmetria on finger-to-nose and heel-knee-shin.    GAIT/STANCE: Posture is normal. Gait is steady with normal steps, base, arm swing, and turning. Heel and toe walking are normal. Tandem gait is normal.  Romberg is absent.   DIAGNOSTIC DATA (LABS, IMAGING, TESTING) - I reviewed patient records, labs, notes, testing and imaging myself where  available.   ASSESSMENT AND PLAN  Bethany Barrera is a 20 y.o. female   Generalized weakness, diffuse body achy pain  No evidence of inflammatory myopathy  Repeat laboratory evaluation, including myasthenia gravis panel, inflammatory markers  Continue treatment with her gynecologist for pelvic inflammatory disease  Cymbalta 30mg  daily, titrating to 60mg  daily  Vitamin D deficiency  Vitamin D supplement 2000 units every day   Levert Feinstein, M.D. Ph.D.  [facility] [address] [place], [place] [address] Ph: [phone] Fax: [phone]  CC: Elfredia Nevins, MD

## 2016-02-16 ENCOUNTER — Encounter: Payer: Self-pay | Admitting: *Deleted

## 2016-02-16 ENCOUNTER — Other Ambulatory Visit: Payer: Self-pay | Admitting: Gastroenterology

## 2016-02-16 ENCOUNTER — Telehealth: Payer: Self-pay | Admitting: Neurology

## 2016-02-16 DIAGNOSIS — R634 Abnormal weight loss: Secondary | ICD-10-CM

## 2016-02-16 DIAGNOSIS — R1011 Right upper quadrant pain: Secondary | ICD-10-CM

## 2016-02-16 DIAGNOSIS — R110 Nausea: Secondary | ICD-10-CM

## 2016-02-16 DIAGNOSIS — R1013 Epigastric pain: Secondary | ICD-10-CM

## 2016-02-16 NOTE — Telephone Encounter (Signed)
Please call patient, laboratory evaluation showed elevated ESR 47, elevated C reactive protein 17,   she is under treatment for pelvic inflammatory disease with antibiotics, by her gynecologist Dr.Fusco, elevated serum marker likely related to her pelvic inflammatory disease,  Rest of the laboratory showed no significant abnormality

## 2016-02-16 NOTE — Telephone Encounter (Signed)
Spoke to patient - aware of results - she is currently under the care of Dr. Sherwood Gambler and he has started her on Ofloxacin.

## 2016-02-20 LAB — ACETYLCHOLINE RECEPTOR AB, ALL: ACETYLCHOL BLOCK AB: 17 % (ref 0–25)

## 2016-02-20 LAB — SEDIMENTATION RATE: Sed Rate: 47 mm/hr — ABNORMAL HIGH (ref 0–32)

## 2016-02-20 LAB — C-REACTIVE PROTEIN: CRP: 17.1 mg/L — AB (ref 0.0–4.9)

## 2016-02-21 ENCOUNTER — Encounter (HOSPITAL_COMMUNITY): Payer: Self-pay | Admitting: Emergency Medicine

## 2016-02-21 DIAGNOSIS — R51 Headache: Secondary | ICD-10-CM | POA: Diagnosis present

## 2016-02-21 DIAGNOSIS — M79672 Pain in left foot: Secondary | ICD-10-CM | POA: Insufficient documentation

## 2016-02-21 DIAGNOSIS — R531 Weakness: Secondary | ICD-10-CM | POA: Diagnosis not present

## 2016-02-21 DIAGNOSIS — Z792 Long term (current) use of antibiotics: Secondary | ICD-10-CM | POA: Diagnosis not present

## 2016-02-21 DIAGNOSIS — R202 Paresthesia of skin: Secondary | ICD-10-CM | POA: Insufficient documentation

## 2016-02-21 DIAGNOSIS — R0602 Shortness of breath: Secondary | ICD-10-CM | POA: Diagnosis not present

## 2016-02-21 DIAGNOSIS — R39198 Other difficulties with micturition: Secondary | ICD-10-CM | POA: Diagnosis not present

## 2016-02-21 DIAGNOSIS — R42 Dizziness and giddiness: Secondary | ICD-10-CM | POA: Diagnosis not present

## 2016-02-21 DIAGNOSIS — M79671 Pain in right foot: Secondary | ICD-10-CM | POA: Insufficient documentation

## 2016-02-21 DIAGNOSIS — Z8659 Personal history of other mental and behavioral disorders: Secondary | ICD-10-CM | POA: Diagnosis not present

## 2016-02-21 DIAGNOSIS — R6884 Jaw pain: Secondary | ICD-10-CM | POA: Diagnosis not present

## 2016-02-21 DIAGNOSIS — K219 Gastro-esophageal reflux disease without esophagitis: Secondary | ICD-10-CM | POA: Insufficient documentation

## 2016-02-21 DIAGNOSIS — M549 Dorsalgia, unspecified: Secondary | ICD-10-CM | POA: Diagnosis not present

## 2016-02-21 DIAGNOSIS — Z79899 Other long term (current) drug therapy: Secondary | ICD-10-CM | POA: Insufficient documentation

## 2016-02-21 DIAGNOSIS — I471 Supraventricular tachycardia: Secondary | ICD-10-CM | POA: Diagnosis not present

## 2016-02-21 LAB — CBC WITH DIFFERENTIAL/PLATELET
BASOS ABS: 0 10*3/uL (ref 0.0–0.1)
BASOS PCT: 0 %
Eosinophils Absolute: 0.1 10*3/uL (ref 0.0–0.7)
Eosinophils Relative: 1 %
HEMATOCRIT: 39.7 % (ref 36.0–46.0)
HEMOGLOBIN: 13.9 g/dL (ref 12.0–15.0)
Lymphocytes Relative: 24 %
Lymphs Abs: 1.8 10*3/uL (ref 0.7–4.0)
MCH: 27.4 pg (ref 26.0–34.0)
MCHC: 35 g/dL (ref 30.0–36.0)
MCV: 78.3 fL (ref 78.0–100.0)
MONOS PCT: 6 %
Monocytes Absolute: 0.4 10*3/uL (ref 0.1–1.0)
NEUTROS ABS: 5.3 10*3/uL (ref 1.7–7.7)
NEUTROS PCT: 69 %
Platelets: 338 10*3/uL (ref 150–400)
RBC: 5.07 MIL/uL (ref 3.87–5.11)
RDW: 15.4 % (ref 11.5–15.5)
WBC: 7.6 10*3/uL (ref 4.0–10.5)

## 2016-02-21 LAB — URINALYSIS, ROUTINE W REFLEX MICROSCOPIC
GLUCOSE, UA: NEGATIVE mg/dL
Hgb urine dipstick: NEGATIVE
NITRITE: NEGATIVE
PROTEIN: 30 mg/dL — AB
Specific Gravity, Urine: 1.041 — ABNORMAL HIGH (ref 1.005–1.030)
pH: 6 (ref 5.0–8.0)

## 2016-02-21 LAB — URINE MICROSCOPIC-ADD ON

## 2016-02-21 LAB — BASIC METABOLIC PANEL
ANION GAP: 15 (ref 5–15)
BUN: 7 mg/dL (ref 6–20)
CHLORIDE: 100 mmol/L — AB (ref 101–111)
CO2: 21 mmol/L — AB (ref 22–32)
Calcium: 9.7 mg/dL (ref 8.9–10.3)
Creatinine, Ser: 0.79 mg/dL (ref 0.44–1.00)
GFR calc non Af Amer: >60 mL/min (ref >60)
Glucose, Bld: 83 mg/dL (ref 65–99)
POTASSIUM: 3.3 mmol/L — AB (ref 3.5–5.1)
Sodium: 136 mmol/L (ref 135–145)

## 2016-02-21 NOTE — ED Notes (Signed)
Pt. reports dizziness with generalized weakness , headache and mid back pain onset 3 months ago , denies injury /ambulatory .

## 2016-02-22 ENCOUNTER — Emergency Department (HOSPITAL_COMMUNITY)
Admission: EM | Admit: 2016-02-22 | Discharge: 2016-02-22 | Disposition: A | Discharge status: Home / Self Care | Payer: Commercial Managed Care - HMO | Attending: Emergency Medicine | Admitting: Emergency Medicine

## 2016-02-22 DIAGNOSIS — I471 Supraventricular tachycardia: Secondary | ICD-10-CM

## 2016-02-22 DIAGNOSIS — I4719 Other supraventricular tachycardia: Secondary | ICD-10-CM | POA: Diagnosis present

## 2016-02-22 DIAGNOSIS — R202 Paresthesia of skin: Secondary | ICD-10-CM

## 2016-02-22 DIAGNOSIS — R531 Weakness: Secondary | ICD-10-CM

## 2016-02-22 MED ORDER — KETOROLAC TROMETHAMINE 30 MG/ML IJ SOLN
INTRAMUSCULAR | Status: AC
  Filled 2016-02-22: qty 1

## 2016-02-22 MED ORDER — METOCLOPRAMIDE HCL 5 MG/ML IJ SOLN
INTRAMUSCULAR | Status: AC
  Filled 2016-02-22: qty 2

## 2016-02-22 MED ORDER — DILTIAZEM HCL 25 MG/5ML IV SOLN
INTRAVENOUS | Status: AC
  Filled 2016-02-22: qty 5

## 2016-02-22 MED ORDER — METHYLPREDNISOLONE SODIUM SUCC 125 MG IJ SOLR
INTRAMUSCULAR | Status: AC
  Filled 2016-02-22: qty 2

## 2016-02-22 MED ORDER — DIPHENHYDRAMINE HCL 50 MG/ML IJ SOLN
INTRAMUSCULAR | Status: AC
  Filled 2016-02-22: qty 1

## 2016-02-22 MED ORDER — PREDNISONE 20 MG PO TABS: 60.0000 mg | ORAL_TABLET | Freq: Every day | ORAL | Status: DC

## 2016-02-22 NOTE — ED Notes (Signed)
Patient moved to room during downtime - see downtime chart for anything done prior to now.

## 2016-02-22 NOTE — H&P (Addendum)
Medical Consultation   Bethany Barrera  PYK:[id number]  DOB: 1995-12-23  DOA: 02/22/2016  PCP: Odette Fraction, MD   Outpatient Specialists: Dr. Krista Blue (neurology)   Requesting physician: Dr. Claudine Mouton  Reason for consultation: Generalized weakness   History of Present Illness: Bethany Barrera is an 20 y.o. female who presents to the ED with 3+ month history of generalized muscle aches and weakness, headache, numbness and tingling of head and neck, numbness of tongue, bilateral leg cramping, bilateral jaw pain with inability to open jaw.  She reports decreased PO intake due to jaw pain.  PCP and neurologist have been unable to determine cause.  Negative or unremarkable lab studies include ANA, Lyme, EBV, Acetylchol Ab, TSH and T4, CBC, BMP, Mg, and CK.  She does have ESR of 47 and CRP of 17 which is abnormal and unexplained.  Today while in the ED she had a sudden increase of heart rate to the 170s which improved down to the 110s without treatment.  EKG looks like an atrial tachycardia.   Review of Systems:  ROS As per HPI otherwise 10 point review of systems negative.     Past Medical History: Past Medical History  Diagnosis Date  . GERD (gastroesophageal reflux disease)   . Anxiety   . Gastritis   . Seasonal allergies   . Muscle weakness     Past Surgical History: Past Surgical History  Procedure Laterality Date  . No past surgeries       Allergies:   Allergies  Allergen Reactions  . Sulfa Antibiotics Shortness Of Breath and Other (See Comments)    tremors  . Tramadol Other (See Comments)    headaches     Social History:  reports that she has never smoked. She has never used smokeless tobacco. She reports that she does not drink alcohol or use illicit drugs.   Family History: Family History  Problem Relation Age of Onset  . Clotting disorder Maternal Grandfather   . Diabetes Maternal Grandfather   . Heart disease Maternal  Grandfather   . Breast cancer Paternal Grandmother   . Diabetes Mother   . Other Mother     gallstones  . Other Sister     pre-diabetes  . Other Sister     gallstones  . Hypertension Mother   . Heart disease Mother   . Hyperlipidemia Father     Unacceptable: Noncontributory, unremarkable, or negative. Acceptable: Family history reviewed and not pertinent (If you reviewed it)   Physical Exam: Filed Vitals:   02/21/16 2207 02/22/16 0014 02/22/16 0419  BP: 123/86 124/83   Pulse: 92 102 120  Temp: 98.3 F (36.8 C)    TempSrc: Oral    Resp: '16 20 19  ' Height: '5\' 6"'  (1.676 m)    Weight: 74.39 kg (164 lb)    SpO2: 98% 100% 100%    Constitutional: Alert and awake, oriented x3, not in any acute distress. Eyes: PERLA, EOMI, irises appear normal, anicteric sclera,  ENMT: external ears and nose appear normal, no apparent oral ulcers, unable to open jaw due to pain Neck: neck appears normal, no masses, normal ROM, no thyromegaly, no JVD  CVS: S1-S2 clear, no murmur rubs or gallops, no LE edema, normal pedal pulses  Respiratory:  clear to auscultation bilaterally, no wheezing, rales or rhonchi. Respiratory effort normal. No accessory muscle use.  Abdomen: soft nontender, nondistended, normal bowel sounds, no  hepatosplenomegaly, no hernias  Musculoskeletal: : no cyanosis, clubbing or edema noted bilaterally Neuro: Cranial nerves II-XII intact, sensation, reflexes, generalized weakness Psych: judgement and insight appear normal, stable mood and affect, mental status Skin: no rashes or lesions or ulcers, no induration or nodules    Data reviewed:  I have personally reviewed following labs and imaging studies Labs:  CBC:  Recent Labs Lab 02/21/16 2210  WBC 7.6  NEUTROABS 5.3  HGB 13.9  HCT 39.7  MCV 78.3  PLT 409    Basic Metabolic Panel:  Recent Labs Lab 02/21/16 2210  NA 136  K 3.3*  CL 100*  CO2 21*  GLUCOSE 83  BUN 7  CREATININE 0.79  CALCIUM 9.7    GFR Estimated Creatinine Clearance: 113.7 mL/min (by C-G formula based on Cr of 0.79). Liver Function Tests: No results for input(s): AST, ALT, ALKPHOS, BILITOT, PROT, ALBUMIN in the last 168 hours. No results for input(s): LIPASE, AMYLASE in the last 168 hours. No results for input(s): AMMONIA in the last 168 hours. Coagulation profile No results for input(s): INR, PROTIME in the last 168 hours.  Cardiac Enzymes: No results for input(s): CKTOTAL, CKMB, CKMBINDEX, TROPONINI in the last 168 hours. BNP: Invalid input(s): POCBNP CBG: No results for input(s): GLUCAP in the last 168 hours. D-Dimer No results for input(s): DDIMER in the last 72 hours. Hgb A1c No results for input(s): HGBA1C in the last 72 hours. Lipid Profile No results for input(s): CHOL, HDL, LDLCALC, TRIG, CHOLHDL, LDLDIRECT in the last 72 hours. Thyroid function studies No results for input(s): TSH, T4TOTAL, T3FREE, THYROIDAB in the last 72 hours.  Invalid input(s): FREET3 Anemia work up No results for input(s): VITAMINB12, FOLATE, FERRITIN, TIBC, IRON, RETICCTPCT in the last 72 hours. Urinalysis    Component Value Date/Time   COLORURINE YELLOW 02/21/2016 2210   APPEARANCEUR CLOUDY* 02/21/2016 2210   LABSPEC 1.041* 02/21/2016 2210   PHURINE 6.0 02/21/2016 2210   GLUCOSEU NEGATIVE 02/21/2016 2210   HGBUR NEGATIVE 02/21/2016 2210   BILIRUBINUR SMALL* 02/21/2016 2210   KETONESUR >80* 02/21/2016 2210   PROTEINUR 30* 02/21/2016 2210   PROTEINUR neg 09/28/2014 0843   UROBILINOGEN 0.2 06/21/2014 0914   NITRITE NEGATIVE 02/21/2016 2210   NITRITE neg 09/28/2014 0843   LEUKOCYTESUR SMALL* 02/21/2016 2210     Microbiology No results found for this or any previous visit (from the past 240 hour(s)).     Inpatient Medications:   Scheduled Meds: . diltiazem      . diphenhydrAMINE      . ketorolac      . methylPREDNISolone sodium succinate      . metoCLOPramide       Continuous Infusions:     Radiological Exams on Admission: No results found.  Impression/Recommendations Active Problems:   Weakness   Paroxysmal atrial tachycardia by electrocardiogram [facility])   Generalized weakness - ongoing for 3 months  At this point, think that the next place for patient to go for work up is likely rheumatology.  This probably can reasonably be done as outpatient given that we do not have rheum availability as an inpatient consult service here at [place] says he is going to start patient on empiric steroids to see if this helps.  PAT on ecg -  May wish to consider cards consult  Likely they will recommend po-beta blocker as first line therapy for suppression.  I do not see any evidence of a delta wave on EKG.   Time Spent: 80  min  Geral Coker M. D.O. [facility] 02/22/2016, 5:01 AM

## 2016-02-22 NOTE — ED Provider Notes (Signed)
CSN: [id number]     Arrival date & time 02/21/16  2157 History  By signing my name below, I, Phillis Haggis, attest that this documentation has been prepared under the direction and in the presence of Tomasita Crumble, MD. Electronically Signed: Phillis Haggis, ED Scribe. 02/22/2016. 3:29 AM.   Chief Complaint  Patient presents with  . Dizziness  . Headache  . Back Pain   The history is provided by the patient. No language interpreter was used.  HPI Comments: Bethany Barrera is a 20 y.o. Female with a hx of anxiety, muscle weakness, and gastritis who presents to the Emergency Department complaining of a headache to the top of her head onset 3 months ago, worsening this evening. She states that the top of her head has numbness and tingling that goes to the back of her neck. She states that her tongue also feels numb. Pt reports associated SOB, difficulty with BMs and urination, painful red spots on the dorsum of the bilateral feet, bilateral leg cramping, and bilateral jaw pain. She states that she has not been eating much because her jaws hurt too much to open her mouth. Pt reports worsening pain with lying down; "trying to do anything with my muscles makes it hurt really badly." Mother reports that she has been lying in bed a lot. Pt has been seen by her PCP who told her that they do not know what the cause is. She states that she first noticed the symptoms after taking one dose of a medication prescribed to her by her GI doctor known to have muscular side effects. Pt is due to have gallbladder surgery. Mother is concerned that pt may have meningitis. Pt is unsure whether or not she has been treated with steroids. Pt has been seen by her PCP and neurologist. Pt has been taking Tylenol and ibuprofen for pain to no relief. She denies hx of autoimmune disease, recent long travel, recent surgeries, sick contacts, hormone therapy, hx of blood clots, fever, cough, vomiting, diarrhea, weakness, vaginal bleeding,  or vaginal discharge.   Past Medical History  Diagnosis Date  . GERD (gastroesophageal reflux disease)   . Anxiety   . Gastritis   . Seasonal allergies   . Muscle weakness    Past Surgical History  Procedure Laterality Date  . No past surgeries     Family History  Problem Relation Age of Onset  . Clotting disorder Maternal Grandfather   . Diabetes Maternal Grandfather   . Heart disease Maternal Grandfather   . Breast cancer Paternal Grandmother   . Diabetes Mother   . Other Mother     gallstones  . Other Sister     pre-diabetes  . Other Sister     gallstones  . Hypertension Mother   . Heart disease Mother   . Hyperlipidemia Father    Social History  Substance Use Topics  . Smoking status: Never Smoker   . Smokeless tobacco: Never Used  . Alcohol Use: No   OB History    No data available     Review of Systems 10 Systems reviewed and all are negative for acute change except as noted in the HPI.  Allergies  Sulfa antibiotics and Tramadol  Home Medications   Prior to Admission medications   Medication Sig Start Date End Date Taking? Authorizing Provider  Calcium 250 MG CAPS Take 500 mg by mouth daily.     Historical Provider, MD  Cholecalciferol (VITAMIN D PO) Take 3,000 Units by  mouth daily.    Historical Provider, MD  esomeprazole (NEXIUM) 40 MG capsule Take 40 mg by mouth daily. 12/22/15   Historical Provider, MD  MONO-LINYAH 0.25-35 MG-MCG tablet Take 1 tablet by mouth daily. 03/01/15   Historical Provider, MD  ofloxacin (FLOXIN) 400 MG tablet Take 400 mg by mouth 2 (two) times daily.    Historical Provider, MD   BP 124/83 mmHg  Pulse 102  Temp(Src) 98.3 F (36.8 C) (Oral)  Resp 20  Ht 5\' 6"  (1.676 m)  Wt 164 lb (74.39 kg)  BMI 26.48 kg/m2  SpO2 100%  LMP 02/01/2016 (Approximate) Physical Exam  Constitutional: She is oriented to person, place, and time. She appears well-developed and well-nourished. No distress.  HENT:  Head: Normocephalic and  atraumatic.  Nose: Nose normal.  Mouth/Throat: Oropharynx is clear and moist. No oropharyngeal exudate.  Eyes: Conjunctivae and EOM are normal. Pupils are equal, round, and reactive to light. No scleral icterus.  Neck: Normal range of motion. Neck supple. No JVD present. No tracheal deviation present. No thyromegaly present.  Cardiovascular: Normal rate, regular rhythm and normal heart sounds.  Exam reveals no gallop and no friction rub.   No murmur heard. Pulmonary/Chest: Effort normal and breath sounds normal. No respiratory distress. She has no wheezes. She exhibits no tenderness.  Abdominal: Soft. Bowel sounds are normal. She exhibits no distension and no mass. There is no tenderness. There is no rebound and no guarding.  Musculoskeletal: Normal range of motion. She exhibits no edema or tenderness.  Lymphadenopathy:    She has no cervical adenopathy.  Neurological: She is alert and oriented to person, place, and time. No cranial nerve deficit. She exhibits normal muscle tone.  Skin: Skin is warm and dry. No rash noted. No erythema. No pallor.  Nursing note and vitals reviewed.   ED Course  Procedures (including critical care time) DIAGNOSTIC STUDIES: Oxygen Saturation is 100% on RA, normal by my interpretation.    COORDINATION OF CARE: 2:13 AM-Discussed treatment plan which includes labs with pt at bedside and pt agreed to plan.    Labs Review Labs Reviewed  BASIC METABOLIC PANEL - Abnormal; Notable for the following:    Potassium 3.3 (*)    Chloride 100 (*)    CO2 21 (*)    All other components within normal limits  URINALYSIS, ROUTINE W REFLEX MICROSCOPIC (NOT AT [facility]) - Abnormal; Notable for the following:    APPearance CLOUDY (*)    Specific Gravity, Urine 1.041 (*)    Bilirubin Urine SMALL (*)    Ketones, ur >80 (*)    Protein, ur 30 (*)    Leukocytes, UA SMALL (*)    All other components within normal limits  URINE MICROSCOPIC-ADD ON - Abnormal; Notable for the  following:    Squamous Epithelial / LPF 6-30 (*)    Bacteria, UA FEW (*)    Crystals CA OXALATE CRYSTALS (*)    All other components within normal limits  CBC WITH DIFFERENTIAL/PLATELET  POC URINE PREG, ED    Imaging Review No results found. I have personally reviewed and evaluated these images and lab results as part of my medical decision-making.   EKG Interpretation None      MDM   Final diagnoses:  Weakness  Paresthesia    Patient presents to the ED for multiple vague complaints.  This has been going on for 3 months and multiple doctors have not been able to figure this out.  Work up here is  normal as well, however patient became tachycardic suddenly in the ED.   HR went to 170.  Appears to be sinus tachycardia but could possibly be flutter with 2:1.  She was given diltiazem for treatment.    Repeat HR is 115 and is still in sinus.    I consulted with Dr. Julian Reil and who suggests patient needs an outpatient referral to rheumatology.  This was provided to the patient.  Will also place on predisone and monitor for improvement.  Patient ok with the plan. She appears well and in NAD.  Patient is safe for DC.     I personally performed the services described in this documentation, which was scribed in my presence. The recorded information has been reviewed and is accurate.      Tomasita Crumble, MD 02/22/16 [phone]

## 2016-02-22 NOTE — ED Notes (Signed)
MD has spoken with patient and her family at the bedside. MD okay with patient being discharged at this time. HR down to 105 ST at this time. Patient reports that she feels better. A&O x 4. Ambulatory with steady gait. Patient and her family verbalized understanding of discharge instructions and deny any further needs or questions at this time.

## 2016-02-22 NOTE — Discharge Instructions (Signed)
Weakness Ms. Heller, take prednisone daily and see if your symptoms improve.  See Rheumatology within 3 days for close follow up.  If symptoms worsen, come back to the ED immediately. Thank you. Weakness is a lack of strength. You may feel weak all over your body or just in one part of your body. Weakness can be serious. In some cases, you may need more medical tests. HOME CARE  Rest.  Eat a well-balanced diet.  Try to exercise every day.  Only take medicines as told by your doctor. GET HELP RIGHT AWAY IF:   You cannot do your normal daily activities.  You cannot walk up and down stairs, or you feel very tired when you do so.  You have shortness of breath or chest pain.  You have trouble moving parts of your body.  You have weakness in only one body part or on only one side of the body.  You have a fever.  You have trouble speaking or swallowing.  You cannot control when you pee (urinate) or poop (bowel movement).  You have black or bloody throw up (vomit) or poop.  Your weakness gets worse or spreads to other body parts.  You have new aches or pains. MAKE SURE YOU:   Understand these instructions.  Will watch your condition.  Will get help right away if you are not doing well or get worse.   This information is not intended to replace advice given to you by your health care provider. Make sure you discuss any questions you have with your health care provider.   Document Released: 09/06/2008 Document Revised: 03/25/2012 Document Reviewed: 11/23/2011 Elsevier Interactive Patient Education [employer]

## 2016-02-23 ENCOUNTER — Ambulatory Visit
Admission: RE | Admit: 2016-02-23 | Discharge: 2016-02-23 | Disposition: A | Discharge status: Home / Self Care | Payer: Commercial Managed Care - HMO | Source: Ambulatory Visit | Attending: Gastroenterology | Admitting: Gastroenterology

## 2016-02-23 DIAGNOSIS — R110 Nausea: Secondary | ICD-10-CM

## 2016-02-23 DIAGNOSIS — R634 Abnormal weight loss: Secondary | ICD-10-CM

## 2016-02-23 DIAGNOSIS — R1011 Right upper quadrant pain: Secondary | ICD-10-CM

## 2016-02-23 DIAGNOSIS — R1013 Epigastric pain: Secondary | ICD-10-CM

## 2016-02-27 ENCOUNTER — Encounter: Payer: Self-pay | Admitting: Family Medicine

## 2016-02-27 ENCOUNTER — Ambulatory Visit (INDEPENDENT_AMBULATORY_CARE_PROVIDER_SITE_OTHER): Payer: Commercial Managed Care - HMO | Admitting: Family Medicine

## 2016-02-27 VITALS — BP 118/88 | HR 98 | Temp 97.9°F | Resp 14 | Ht 60.25 in | Wt 164.0 lb

## 2016-02-27 DIAGNOSIS — Z7189 Other specified counseling: Secondary | ICD-10-CM | POA: Diagnosis not present

## 2016-02-27 DIAGNOSIS — G629 Polyneuropathy, unspecified: Secondary | ICD-10-CM | POA: Diagnosis not present

## 2016-02-27 DIAGNOSIS — R634 Abnormal weight loss: Secondary | ICD-10-CM | POA: Diagnosis not present

## 2016-02-27 DIAGNOSIS — M6281 Muscle weakness (generalized): Secondary | ICD-10-CM | POA: Diagnosis not present

## 2016-02-27 DIAGNOSIS — Z7689 Persons encountering health services in other specified circumstances: Secondary | ICD-10-CM

## 2016-02-27 LAB — COMPLETE METABOLIC PANEL WITH GFR
ALBUMIN: 4.2 g/dL (ref 3.6–5.1)
ALK PHOS: 74 U/L (ref 33–115)
ALT: 6 U/L (ref 6–29)
AST: 13 U/L (ref 10–30)
BUN: 6 mg/dL — AB (ref 7–25)
CO2: 18 mmol/L — ABNORMAL LOW (ref 20–31)
Calcium: 9.7 mg/dL (ref 8.6–10.2)
Chloride: 101 mmol/L (ref 98–110)
Creat: 0.71 mg/dL (ref 0.50–1.10)
GFR, Est African American: >89 mL/min (ref >=60)
GFR, Est Non African American: >89 mL/min (ref >=60)
GLUCOSE: 80 mg/dL (ref 70–99)
POTASSIUM: 3.6 mmol/L (ref 3.5–5.3)
SODIUM: 139 mmol/L (ref 135–146)
TOTAL PROTEIN: 7.6 g/dL (ref 6.1–8.1)
Total Bilirubin: 0.5 mg/dL (ref 0.2–1.2)

## 2016-02-27 LAB — CBC WITH DIFFERENTIAL/PLATELET
BASOS ABS: 0 {cells}/uL (ref 0–200)
Basophils Relative: 0 %
EOS ABS: 114 {cells}/uL (ref 15–500)
Eosinophils Relative: 2 %
HEMATOCRIT: 40.3 % (ref 35.0–45.0)
Hemoglobin: 13.6 g/dL (ref 12.0–15.0)
LYMPHS PCT: 22 %
Lymphs Abs: 1254 cells/uL (ref 850–3900)
MCH: 26.9 pg — AB (ref 27.0–33.0)
MCHC: 33.7 g/dL (ref 32.0–36.0)
MCV: 79.8 fL — AB (ref 80.0–100.0)
MONO ABS: 342 {cells}/uL (ref 200–950)
MONOS PCT: 6 %
MPV: 10.2 fL (ref 7.5–12.5)
NEUTROS PCT: 70 %
Neutro Abs: 3990 cells/uL (ref 1500–7800)
Platelets: 342 10*3/uL (ref 140–400)
RBC: 5.05 MIL/uL (ref 3.80–5.10)
RDW: 17.3 % — ABNORMAL HIGH (ref 11.0–15.0)
WBC: 5.7 10*3/uL (ref 3.8–10.8)

## 2016-02-27 LAB — CK: CK TOTAL: 45 U/L (ref 7–177)

## 2016-02-27 LAB — VITAMIN B12: VITAMIN B 12: 725 pg/mL (ref 200–1100)

## 2016-02-27 LAB — MAGNESIUM: Magnesium: 1.9 mg/dL (ref 1.5–2.5)

## 2016-02-27 LAB — PHOSPHORUS: Phosphorus: 4.3 mg/dL (ref 2.5–4.5)

## 2016-02-27 LAB — SEDIMENTATION RATE: Sed Rate: 44 mm/hr — ABNORMAL HIGH (ref 0–20)

## 2016-02-27 NOTE — Progress Notes (Signed)
Subjective:    Patient ID: Bethany Barrera, female    DOB: [birth year], 20 y.o.   MRN: [id number]  HPI Patient is a very pleasant 20 year old African-American female who presents today to establish care. She has a very complicated chief complaint. Symptoms began approximately 3 months ago. Over the last 3 months she reports losing approximately 60 pounds. This is due to the fact that she is unable to eat. Subjectively she is unable to open her jaw due to pain in the masseter muscles. She also reports diffuse muscle weakness in her arms and in her legs. She reports numbness and tingling in her scalp and in her arms and in her legs. It is not waxing and waning but constant. Her previous PCP and her neurologist have been unable to isolate the cause of her symptoms as of yet. I reviewed the lab work and workup to date including an MRI of the brain that was normal, Lyme studies that were normal, TSH was normal, sedimentation rate slightly high. CRP was slightly high. EMG and nerve conduction studies reveal no evidence of neuropathy or myopathy. She has an appointment to see a rheumatologist on Wednesday. She went to the emergency room recently and was diagnosed with atrial tachycardia presumed secondary to dehydration. They debated starting her on prednisone but the patient denies taking prednisone at the present time. Lab workup in the emergency room was significant for a low potassium. Workup as an outpatient is also significant for low vitamin D. Past Medical History  Diagnosis Date  . GERD (gastroesophageal reflux disease)   . Anxiety   . Gastritis   . Seasonal allergies   . Muscle weakness    Past Surgical History  Procedure Laterality Date  . No past surgeries     Current Outpatient Prescriptions on File Prior to Visit  Medication Sig Dispense Refill  . Calcium 250 MG CAPS Take 500 mg by mouth daily.     . Cholecalciferol (VITAMIN D PO) Take 3,000 Units by mouth daily.    Marland Kitchen esomeprazole  (NEXIUM) 40 MG capsule Take 40 mg by mouth daily.  1  . MONO-LINYAH 0.25-35 MG-MCG tablet Take 1 tablet by mouth daily. Reported on 02/27/2016    . [DISCONTINUED] hyoscyamine (LEVSIN SL) 0.125 MG SL tablet Place 1 tablet (0.125 mg total) under the tongue every 8 (eight) hours as needed for cramping. (Patient not taking: Reported on 01/04/2016) 60 tablet 2  . [DISCONTINUED] omeprazole (PRILOSEC) 20 MG capsule Take 1 capsule (20 mg total) by mouth 2 (two) times daily before a meal. (Patient not taking: Reported on 01/04/2016) 30 capsule 0  . [DISCONTINUED] ranitidine (ZANTAC) 150 MG tablet Take 1 tablet (150 mg total) by mouth 2 (two) times daily. (Patient not taking: Reported on 01/04/2016) 60 tablet 2  . [DISCONTINUED] sucralfate (CARAFATE) 1 g tablet Take 1 tablet (1 g total) by mouth 4 (four) times daily -  with meals and at bedtime. (Patient not taking: Reported on 01/04/2016) 30 tablet 0   No current facility-administered medications on file prior to visit.   Allergies  Allergen Reactions  . Sulfa Antibiotics Shortness Of Breath and Other (See Comments)    tremors  . Tramadol Other (See Comments)    headaches   Social History   Social History  . Marital Status: Single    Spouse Name: N/A  . Number of Children: 0  . Years of Education: 11   Occupational History  . Student    Social History Main  Topics  . Smoking status: Never Smoker   . Smokeless tobacco: Never Used  . Alcohol Use: No  . Drug Use: No  . Sexual Activity: Yes    Birth Control/ Protection: Pill   Other Topics Concern  . Not on file   Social History Narrative   Lives at home with mother and father.   Right-handed.   No caffeine use.   Family History  Problem Relation Age of Onset  . Clotting disorder Maternal Grandfather   . Diabetes Maternal Grandfather   . Heart disease Maternal Grandfather   . Breast cancer Paternal Grandmother   . Diabetes Mother   . Other Mother     gallstones  . Other Sister      pre-diabetes  . Other Sister     gallstones  . Hypertension Mother   . Heart disease Mother   . Hyperlipidemia Father       Review of Systems  All other systems reviewed and are negative.      Objective:   Physical Exam  Constitutional: She is oriented to person, place, and time. She appears well-developed and well-nourished. No distress.  HENT:  Right Ear: External ear normal.  Left Ear: External ear normal.  Nose: Nose normal.  Mouth/Throat: Oropharynx is clear and moist. No oropharyngeal exudate.  Eyes: Conjunctivae are normal. Right eye exhibits no discharge. Left eye exhibits no discharge. No scleral icterus.  Neck: Neck supple. No JVD present. No tracheal deviation present. No thyromegaly present.  Cardiovascular: Regular rhythm.  Tachycardia present.   Pulmonary/Chest: Effort normal and breath sounds normal. No respiratory distress. She has no wheezes. She has no rales. She exhibits no tenderness.  Abdominal: Soft. Bowel sounds are normal. She exhibits no distension and no mass. There is no tenderness. There is no rebound and no guarding.  Lymphadenopathy:    She has no cervical adenopathy.  Neurological: She is alert and oriented to person, place, and time. She has normal reflexes. She displays normal reflexes. No cranial nerve deficit. She exhibits normal muscle tone. Coordination normal.  Skin: She is not diaphoretic.  Psychiatric: Her behavior is normal. Judgment and thought content normal. Her speech is delayed. Cognition and memory are normal. She exhibits a depressed mood.  Vitals reviewed.         Assessment & Plan:  Muscle weakness (generalized) - Plan: Copper, Serum, Vitamin B12, Aldolase, CK, Sedimentation rate, CBC with Differential/Platelet, COMPLETE METABOLIC PANEL WITH GFR, Magnesium, Phosphorus  Establishing care with new doctor, encounter for  Loss of weight  Neuropathy (HCC)  MRI of the brain and lab workup to date has been normal. At this  point I will evaluate for other laboratory causes of diffuse muscle weakness including checking a copper level given her significant weight loss she may have copper deficiency, I will check a vitamin B12, I'll check a phosphate as well as a magnesium level. I will check a sedimentation rate and a CK to see if these are persistent elevations or spurious one-time elevations. I will check a CK and aldolase to evaluate for any evidence of inflammatory myositis. If lab work suggests inflammatory myositis, I would recommend a muscle biopsy under the care of a rheumatologist to determine if she has an inflammatory myopathy. If lab workup is normal, I would recommend a MRI of the C-spine, T-spine, and L-spine to evaluate for any evidence of spinal cord abnormalities or white matter disease. She has had lab workup for myasthenia gravis which was normal. I would  even consider an empiric trial of pyridostigmine.

## 2016-03-01 LAB — ALDOLASE: Aldolase: 2.5 U/L (ref <=8.1)

## 2016-03-02 ENCOUNTER — Telehealth: Payer: Self-pay | Admitting: Family Medicine

## 2016-03-02 MED ORDER — PREDNISONE 20 MG PO TABS: ORAL_TABLET | ORAL | Status: DC

## 2016-03-02 NOTE — Telephone Encounter (Signed)
Mother calling.  Daughter has throbbing headache.  Throat muscles feel like they are throbbing and she can't eat.  Only drinking ensure.  Told about Prednisone, but wants to know where are we going from here??

## 2016-03-02 NOTE — Telephone Encounter (Signed)
Pred taper pack sent to pharm

## 2016-03-02 NOTE — Telephone Encounter (Signed)
I would try prednisone taper pack over the weekend to see if symptoms improve.

## 2016-03-02 NOTE — Telephone Encounter (Signed)
Patient went and saw a rheumatologist and he didn't do any testing on her since he didn't think auto immune issues. Please advise of what she needs to do.  CB#  [phone]

## 2016-03-08 ENCOUNTER — Encounter: Payer: Self-pay | Admitting: Family Medicine

## 2016-03-08 ENCOUNTER — Ambulatory Visit (INDEPENDENT_AMBULATORY_CARE_PROVIDER_SITE_OTHER): Payer: Commercial Managed Care - HMO | Admitting: Family Medicine

## 2016-03-08 ENCOUNTER — Telehealth: Payer: Self-pay | Admitting: *Deleted

## 2016-03-08 VITALS — BP 102/68 | HR 78 | Temp 98.7°F | Resp 14 | Ht 60.0 in | Wt 158.0 lb

## 2016-03-08 DIAGNOSIS — R634 Abnormal weight loss: Secondary | ICD-10-CM | POA: Diagnosis not present

## 2016-03-08 DIAGNOSIS — G629 Polyneuropathy, unspecified: Secondary | ICD-10-CM | POA: Diagnosis not present

## 2016-03-08 DIAGNOSIS — M6281 Muscle weakness (generalized): Secondary | ICD-10-CM | POA: Diagnosis not present

## 2016-03-08 MED ORDER — DICLOFENAC SODIUM 75 MG PO TBEC: 75.0000 mg | DELAYED_RELEASE_TABLET | Freq: Two times a day (BID) | ORAL | Status: DC

## 2016-03-08 MED ORDER — CYCLOBENZAPRINE HCL 10 MG PO TABS: 10.0000 mg | ORAL_TABLET | Freq: Three times a day (TID) | ORAL | Status: DC | PRN

## 2016-03-08 NOTE — Telephone Encounter (Signed)
Call placed to patient and patient made aware.   Prescription sent to pharmacy.  

## 2016-03-08 NOTE — Telephone Encounter (Signed)
-----   Message from Donita Brooks, MD sent at 03/08/2016  1:42 PM EDT ----- If she is not going to take prednisone, try diclofenac 75 mg pobid and flexeril 10 mg poq8 hrs prn muscle spasms and tightness.   ----- Message -----    From: Durwin Nora Six, LPN    Sent: 12/08/8248  12:59 PM      To: Donita Brooks, MD  Patient requesting something for pain.   Please advise.

## 2016-03-08 NOTE — Progress Notes (Signed)
Subjective:    Patient ID: ANNE BOLTZ, female    DOB: 06/19/1996, 20 y.o.   MRN: [id number]  HPI 02/27/16 Patient is a very pleasant 20 year old African-American female who presents today to establish care. She has a very complicated chief complaint. Symptoms began approximately 3 months ago. Over the last 3 months she reports losing approximately 60 pounds. This is due to the fact that she is unable to eat. Subjectively she is unable to open her jaw due to pain in the masseter muscles. She also reports diffuse muscle weakness in her arms and in her legs. She reports numbness and tingling in her scalp and in her arms and in her legs. It is not waxing and waning but constant. Her previous PCP and her neurologist have been unable to isolate the cause of her symptoms as of yet. I reviewed the lab work and workup to date including an MRI of the brain that was normal, Lyme studies that were normal, TSH was normal, sedimentation rate slightly high. CRP was slightly high. EMG and nerve conduction studies reveal no evidence of neuropathy or myopathy. She has an appointment to see a rheumatologist on Wednesday. She went to the emergency room recently and was diagnosed with atrial tachycardia presumed secondary to dehydration. They debated starting her on prednisone but the patient denies taking prednisone at the present time. Lab workup in the emergency room was significant for a low potassium. Workup as an outpatient is also significant for low vitamin D.  At that time, my plan was: MRI of the brain and lab workup to date has been normal. At this point I will evaluate for other laboratory causes of diffuse muscle weakness including checking a copper level given her significant weight loss she may have copper deficiency, I will check a vitamin B12, I'll check a phosphate as well as a magnesium level. I will check a sedimentation rate and a CK to see if these are persistent elevations or spurious one-time  elevations. I will check a CK and aldolase to evaluate for any evidence of inflammatory myositis. If lab work suggests inflammatory myositis, I would recommend a muscle biopsy under the care of a rheumatologist to determine if she has an inflammatory myopathy. If lab workup is normal, I would recommend a MRI of the C-spine, T-spine, and L-spine to evaluate for any evidence of spinal cord abnormalities or white matter disease. She has had lab workup for myasthenia gravis which was normal. I would even consider an empiric trial of pyridostigmine.  Office Visit on 02/27/2016  Component Date Value Ref Range Status  . Vitamin B-12 02/27/2016 725  200 - 1100 pg/mL Final  . Aldolase 02/27/2016 2.5  <=8.1 U/L Final  . Total CK 02/27/2016 45  7 - 177 U/L Final  . Sed Rate 02/27/2016 44* 0 - 20 mm/hr Final  . WBC 02/27/2016 5.7  3.8 - 10.8 K/uL Final  . RBC 02/27/2016 5.05  3.80 - 5.10 MIL/uL Final  . Hemoglobin 02/27/2016 13.6  12.0 - 15.0 g/dL Final  . HCT 02/27/2016 40.3  35.0 - 45.0 % Final  . MCV 02/27/2016 79.8* 80.0 - 100.0 fL Final  . MCH 02/27/2016 26.9* 27.0 - 33.0 pg Final  . MCHC 02/27/2016 33.7  32.0 - 36.0 g/dL Final  . RDW 02/27/2016 17.3* 11.0 - 15.0 % Final  . Platelets 02/27/2016 342  140 - 400 K/uL Final  . MPV 02/27/2016 10.2  7.5 - 12.5 fL Final  . Neutro Abs  02/27/2016 3990  1500 - 7800 cells/uL Final  . Lymphs Abs 02/27/2016 1254  850 - 3900 cells/uL Final  . Monocytes Absolute 02/27/2016 342  200 - 950 cells/uL Final  . Eosinophils Absolute 02/27/2016 114  15 - 500 cells/uL Final  . Basophils Absolute 02/27/2016 0  0 - 200 cells/uL Final  . Neutrophils Relative % 02/27/2016 70   Final  . Lymphocytes Relative 02/27/2016 22   Final  . Monocytes Relative 02/27/2016 6   Final  . Eosinophils Relative 02/27/2016 2   Final  . Basophils Relative 02/27/2016 0   Final  . Smear Review 02/27/2016 Criteria for review not met   Final   ** Please note change in unit of measure and  reference range(s). **  . Sodium 02/27/2016 139  135 - 146 mmol/L Final  . Potassium 02/27/2016 3.6  3.5 - 5.3 mmol/L Final  . Chloride 02/27/2016 101  98 - 110 mmol/L Final  . CO2 02/27/2016 18* 20 - 31 mmol/L Final  . Glucose, Bld 02/27/2016 80  70 - 99 mg/dL Final  . BUN 02/27/2016 6* 7 - 25 mg/dL Final  . Creat 02/27/2016 0.71  0.50 - 1.10 mg/dL Final  . Total Bilirubin 02/27/2016 0.5  0.2 - 1.2 mg/dL Final  . Alkaline Phosphatase 02/27/2016 74  33 - 115 U/L Final  . AST 02/27/2016 13  10 - 30 U/L Final  . ALT 02/27/2016 6  6 - 29 U/L Final  . Total Protein 02/27/2016 7.6  6.1 - 8.1 g/dL Final  . Albumin 02/27/2016 4.2  3.6 - 5.1 g/dL Final  . Calcium 02/27/2016 9.7  8.6 - 10.2 mg/dL Final  . GFR, Est African American 02/27/2016 >89  >=60 mL/min Final  . GFR, Est Non African American 02/27/2016 >89  >=60 mL/min Final   Comment:   The estimated GFR is a calculation valid for adults (>=53 years old) that uses the CKD-EPI algorithm to adjust for age and sex. It is   not to be used for children, pregnant women, hospitalized patients,    patients on dialysis, or with rapidly changing kidney function. According to the NKDEP, eGFR >89 is normal, 60-89 shows mild impairment, 30-59 shows moderate impairment, 15-29 shows severe impairment and <15 is ESRD.     . Magnesium 02/27/2016 1.9  1.5 - 2.5 mg/dL Final  . Phosphorus 02/27/2016 4.3  2.5 - 4.5 mg/dL Final   03/08/16 Patient saw the rheumatologist. I have not received his communication back yet but per the patient's report, the rheumatologist did not feel this is an autoimmune process. She continues to report diffuse muscle weakness and now diffuse muscle pains he continues to have neuropathic paresthesias all over her body. She continues to be unable to open her jaw/mouth due to severe pain in her TMJ area and muscle tightness which is subjective in her masseter muscles. She continues to demonstrate a very flat affect. She is here today  with her mother who does the majority of the talking. Patient will barely respond. She has taken to writing her answers down on a notepad rather than speak because of the pain in her TMJ joints.  She continues to lose weight due to her inability to open her mouth and she also reports trouble swallowing with pain in her upper throat. ( Past Medical History  Diagnosis Date  . GERD (gastroesophageal reflux disease)   . Anxiety   . Gastritis   . Seasonal allergies   . Muscle weakness  Past Surgical History  Procedure Laterality Date  . No past surgeries     Current Outpatient Prescriptions on File Prior to Visit  Medication Sig Dispense Refill  . Calcium 250 MG CAPS Take 500 mg by mouth daily.     . Cholecalciferol (VITAMIN D PO) Take 3,000 Units by mouth daily.    Marland Kitchen esomeprazole (NEXIUM) 40 MG capsule Take 40 mg by mouth daily.  1  . MONO-LINYAH 0.25-35 MG-MCG tablet Take 1 tablet by mouth daily. Reported on 02/27/2016    . [DISCONTINUED] hyoscyamine (LEVSIN SL) 0.125 MG SL tablet Place 1 tablet (0.125 mg total) under the tongue every 8 (eight) hours as needed for cramping. (Patient not taking: Reported on 01/04/2016) 60 tablet 2  . [DISCONTINUED] omeprazole (PRILOSEC) 20 MG capsule Take 1 capsule (20 mg total) by mouth 2 (two) times daily before a meal. (Patient not taking: Reported on 01/04/2016) 30 capsule 0  . [DISCONTINUED] ranitidine (ZANTAC) 150 MG tablet Take 1 tablet (150 mg total) by mouth 2 (two) times daily. (Patient not taking: Reported on 01/04/2016) 60 tablet 2  . [DISCONTINUED] sucralfate (CARAFATE) 1 g tablet Take 1 tablet (1 g total) by mouth 4 (four) times daily -  with meals and at bedtime. (Patient not taking: Reported on 01/04/2016) 30 tablet 0   No current facility-administered medications on file prior to visit.   Allergies  Allergen Reactions  . Sulfa Antibiotics Shortness Of Breath and Other (See Comments)    tremors  . Tramadol Other (See Comments)    headaches    Social History   Social History  . Marital Status: Single    Spouse Name: N/A  . Number of Children: 0  . Years of Education: 11   Occupational History  . Student    Social History Main Topics  . Smoking status: Never Smoker   . Smokeless tobacco: Never Used  . Alcohol Use: No  . Drug Use: No  . Sexual Activity: Yes    Birth Control/ Protection: Pill   Other Topics Concern  . Not on file   Social History Narrative   Lives at home with mother and father.   Right-handed.   No caffeine use.   Family History  Problem Relation Age of Onset  . Clotting disorder Maternal Grandfather   . Diabetes Maternal Grandfather   . Heart disease Maternal Grandfather   . Breast cancer Paternal Grandmother   . Diabetes Mother   . Other Mother     gallstones  . Other Sister     pre-diabetes  . Other Sister     gallstones  . Hypertension Mother   . Heart disease Mother   . Hyperlipidemia Father       Review of Systems  All other systems reviewed and are negative.      Objective:   Physical Exam  Constitutional: She is oriented to person, place, and time. She appears well-developed and well-nourished. No distress.  HENT:  Right Ear: External ear normal.  Left Ear: External ear normal.  Nose: Nose normal.  Mouth/Throat: Oropharynx is clear and moist. No oropharyngeal exudate.  Eyes: Conjunctivae are normal. Right eye exhibits no discharge. Left eye exhibits no discharge. No scleral icterus.  Neck: Neck supple. No JVD present. No tracheal deviation present. No thyromegaly present.  Cardiovascular: Regular rhythm.  Tachycardia present.   Pulmonary/Chest: Effort normal and breath sounds normal. No respiratory distress. She has no wheezes. She has no rales. She exhibits no tenderness.  Abdominal: Soft.  Bowel sounds are normal. She exhibits no distension and no mass. There is no tenderness. There is no rebound and no guarding.  Lymphadenopathy:    She has no cervical  adenopathy.  Neurological: She is alert and oriented to person, place, and time. She has normal reflexes. No cranial nerve deficit. She exhibits normal muscle tone. Coordination normal.  Skin: She is not diaphoretic.  Psychiatric: Her behavior is normal. Judgment and thought content normal. Her speech is delayed. Cognition and memory are normal. She exhibits a depressed mood.  Vitals reviewed.         Assessment & Plan:  Loss of weight - Plan: Ambulatory referral to ENT  Muscle weakness (generalized) - Plan: Ambulatory referral to Neurology  Neuropathy [facility]) - Plan: Ambulatory referral to Neurology So far the only abnormality I can find is a slightly elevated sedimentation rate. I suggested trying a course of prednisone to see if her muscle weakness and muscle pains would improve suggesting an autoimmune process and possibly getting a second opinion with a different rheumatologist. The patient refuses to take prednisone. Therefore I will start her on diclofenac 75 mg by mouth twice a day in addition to Flexeril 10 mg every 8 hours as needed for muscle spasms. I will arrange a second opinion with a neurologist at [facility]. I will also consult ENT given her severe TMJ pain and her sore throat. My clinical impression is that the patient may have psychosomatic complaints however I want to exhaust all possibilities prior to treating her for this.

## 2016-03-10 ENCOUNTER — Encounter: Payer: Self-pay | Admitting: Family Medicine

## 2016-03-12 ENCOUNTER — Telehealth: Payer: Self-pay

## 2016-03-12 NOTE — Telephone Encounter (Signed)
Patients mother called to see if it was ok for patient to take ibuprofen while taking prednisone.  Advised her not to do this.  Mother understood.

## 2016-03-16 ENCOUNTER — Ambulatory Visit (INDEPENDENT_AMBULATORY_CARE_PROVIDER_SITE_OTHER): Payer: Commercial Managed Care - HMO | Admitting: Family Medicine

## 2016-03-16 ENCOUNTER — Encounter: Payer: Self-pay | Admitting: Family Medicine

## 2016-03-16 VITALS — BP 108/78 | HR 79 | Temp 98.5°F | Resp 16 | Wt 156.0 lb

## 2016-03-16 DIAGNOSIS — R1310 Dysphagia, unspecified: Secondary | ICD-10-CM | POA: Diagnosis not present

## 2016-03-16 DIAGNOSIS — R634 Abnormal weight loss: Secondary | ICD-10-CM

## 2016-03-16 DIAGNOSIS — G500 Trigeminal neuralgia: Secondary | ICD-10-CM

## 2016-03-16 DIAGNOSIS — R6884 Jaw pain: Secondary | ICD-10-CM | POA: Diagnosis not present

## 2016-03-16 MED ORDER — CARBAMAZEPINE ER 100 MG PO TB12: 100.0000 mg | ORAL_TABLET | Freq: Two times a day (BID) | ORAL | Status: DC

## 2016-03-16 NOTE — Progress Notes (Signed)
Subjective:    Patient ID: Bethany Barrera, female    DOB: [birth year], 20 y.o.   MRN: [id number]  Mouth Lesions  Associated symptoms include mouth sores.  02/27/16 Patient is a very pleasant 20 year old African-American female who presents today to establish care. She has a very complicated chief complaint. Symptoms began approximately 3 months ago. Over the last 3 months she reports losing approximately 60 pounds. This is due to the fact that she is unable to eat. Subjectively she is unable to open her jaw due to pain in the masseter muscles. She also reports diffuse muscle weakness in her arms and in her legs. She reports numbness and tingling in her scalp and in her arms and in her legs. It is not waxing and waning but constant. Her previous PCP and her neurologist have been unable to isolate the cause of her symptoms as of yet. I reviewed the lab work and workup to date including an MRI of the brain that was normal, Lyme studies that were normal, TSH was normal, sedimentation rate slightly high. CRP was slightly high. EMG and nerve conduction studies reveal no evidence of neuropathy or myopathy. She has an appointment to see a rheumatologist on Wednesday. She went to the emergency room recently and was diagnosed with atrial tachycardia presumed secondary to dehydration. They debated starting her on prednisone but the patient denies taking prednisone at the present time. Lab workup in the emergency room was significant for a low potassium. Workup as an outpatient is also significant for low vitamin D.  At that time, my plan was: MRI of the brain and lab workup to date has been normal. At this point I will evaluate for other laboratory causes of diffuse muscle weakness including checking a copper level given her significant weight loss she may have copper deficiency, I will check a vitamin B12, I'll check a phosphate as well as a magnesium level. I will check a sedimentation rate and a CK to see if  these are persistent elevations or spurious one-time elevations. I will check a CK and aldolase to evaluate for any evidence of inflammatory myositis. If lab work suggests inflammatory myositis, I would recommend a muscle biopsy under the care of a rheumatologist to determine if she has an inflammatory myopathy. If lab workup is normal, I would recommend a MRI of the C-spine, T-spine, and L-spine to evaluate for any evidence of spinal cord abnormalities or white matter disease. She has had lab workup for myasthenia gravis which was normal. I would even consider an empiric trial of pyridostigmine.  No visits with results within 2 Week(s) from this visit. Latest known visit with results is:  Office Visit on 02/27/2016  Component Date Value Ref Range Status  . Vitamin B-12 02/27/2016 725  200 - 1100 pg/mL Final  . Aldolase 02/27/2016 2.5  <=8.1 U/L Final  . Total CK 02/27/2016 45  7 - 177 U/L Final  . Sed Rate 02/27/2016 44* 0 - 20 mm/hr Final  . WBC 02/27/2016 5.7  3.8 - 10.8 K/uL Final  . RBC 02/27/2016 5.05  3.80 - 5.10 MIL/uL Final  . Hemoglobin 02/27/2016 13.6  12.0 - 15.0 g/dL Final  . HCT 02/27/2016 40.3  35.0 - 45.0 % Final  . MCV 02/27/2016 79.8* 80.0 - 100.0 fL Final  . MCH 02/27/2016 26.9* 27.0 - 33.0 pg Final  . MCHC 02/27/2016 33.7  32.0 - 36.0 g/dL Final  . RDW 02/27/2016 17.3* 11.0 - 15.0 % Final  .  Platelets 02/27/2016 342  140 - 400 K/uL Final  . MPV 02/27/2016 10.2  7.5 - 12.5 fL Final  . Neutro Abs 02/27/2016 3990  1500 - 7800 cells/uL Final  . Lymphs Abs 02/27/2016 1254  850 - 3900 cells/uL Final  . Monocytes Absolute 02/27/2016 342  200 - 950 cells/uL Final  . Eosinophils Absolute 02/27/2016 114  15 - 500 cells/uL Final  . Basophils Absolute 02/27/2016 0  0 - 200 cells/uL Final  . Neutrophils Relative % 02/27/2016 70   Final  . Lymphocytes Relative 02/27/2016 22   Final  . Monocytes Relative 02/27/2016 6   Final  . Eosinophils Relative 02/27/2016 2   Final  . Basophils  Relative 02/27/2016 0   Final  . Smear Review 02/27/2016 Criteria for review not met   Final   ** Please note change in unit of measure and reference range(s). **  . Sodium 02/27/2016 139  135 - 146 mmol/L Final  . Potassium 02/27/2016 3.6  3.5 - 5.3 mmol/L Final  . Chloride 02/27/2016 101  98 - 110 mmol/L Final  . CO2 02/27/2016 18* 20 - 31 mmol/L Final  . Glucose, Bld 02/27/2016 80  70 - 99 mg/dL Final  . BUN 02/27/2016 6* 7 - 25 mg/dL Final  . Creat 02/27/2016 0.71  0.50 - 1.10 mg/dL Final  . Total Bilirubin 02/27/2016 0.5  0.2 - 1.2 mg/dL Final  . Alkaline Phosphatase 02/27/2016 74  33 - 115 U/L Final  . AST 02/27/2016 13  10 - 30 U/L Final  . ALT 02/27/2016 6  6 - 29 U/L Final  . Total Protein 02/27/2016 7.6  6.1 - 8.1 g/dL Final  . Albumin 02/27/2016 4.2  3.6 - 5.1 g/dL Final  . Calcium 02/27/2016 9.7  8.6 - 10.2 mg/dL Final  . GFR, Est African American 02/27/2016 >89  >=60 mL/min Final  . GFR, Est Non African American 02/27/2016 >89  >=60 mL/min Final   Comment:   The estimated GFR is a calculation valid for adults (>=3 years old) that uses the CKD-EPI algorithm to adjust for age and sex. It is   not to be used for children, pregnant women, hospitalized patients,    patients on dialysis, or with rapidly changing kidney function. According to the NKDEP, eGFR >89 is normal, 60-89 shows mild impairment, 30-59 shows moderate impairment, 15-29 shows severe impairment and <15 is ESRD.     . Magnesium 02/27/2016 1.9  1.5 - 2.5 mg/dL Final  . Phosphorus 02/27/2016 4.3  2.5 - 4.5 mg/dL Final   03/08/16 Patient saw the rheumatologist. I have not received his communication back yet but per the patient's report, the rheumatologist did not feel this is an autoimmune process. She continues to report diffuse muscle weakness and now diffuse muscle pains he continues to have neuropathic paresthesias all over her body. She continues to be unable to open her jaw/mouth due to severe pain in her  TMJ area and muscle tightness which is subjective in her masseter muscles. She continues to demonstrate a very flat affect. She is here today with her mother who does the majority of the talking. Patient will barely respond. She has taken to writing her answers down on a notepad rather than speak because of the pain in her TMJ joints.  She continues to lose weight due to her inability to open her mouth and she also reports trouble swallowing with pain in her upper throat. At that time, my plan was: So far  the only abnormality I can find is a slightly elevated sedimentation rate. I suggested trying a course of prednisone to see if her muscle weakness and muscle pains would improve suggesting an autoimmune process and possibly getting a second opinion with a different rheumatologist. The patient refuses to take prednisone. Therefore I will start her on diclofenac 75 mg by mouth twice a day in addition to Flexeril 10 mg every 8 hours as needed for muscle spasms. I will arrange a second opinion with a neurologist at [facility]. I will also consult ENT given her severe TMJ pain and her sore throat. My clinical impression is that the patient may have psychosomatic complaints however I want to exhaust all possibilities prior to treating her for this.  03/16/16 Since the last time I saw the patient, she has tried a prednisone taper pack with no improvement in her symptoms. She is here today with her mother. She refuses to talk. She refuses to open her mouth. She refuses to even allow me to touch the TMJ joints bilaterally. She has a flat affect and is noncommunicative. Her demeanor is extremely abnormal. Even with maximal coaching and urging the patient will barely open her mouth and speak but she continues to insist on writing everything down or not answering my questions at all. Her primary complaint at the present time is severe pain in both TMJ joints that radiates onto her forehead bilaterally down her jaw  bilaterally towards her mouth. She also complains of pain and pressure radiating up her scalp into the parietal lobe bilaterally. However most of his communication is limited to hand signals and her mother's interpretation of cryptic notes. Continues to lose weight. She is yet to see the neurologist at [facility]. She has yet to see the ENT doctor. Again she refuses to allow me to perform even a rudimentary exam of the oral cavity due to the "pain" she just shakes her head no when I asked her to open her mouth and refuses to allow me to try to passively open her mouth. Past Medical History  Diagnosis Date  . GERD (gastroesophageal reflux disease)   . Anxiety   . Gastritis   . Seasonal allergies   . Muscle weakness    Past Surgical History  Procedure Laterality Date  . No past surgeries     Current Outpatient Prescriptions on File Prior to Visit  Medication Sig Dispense Refill  . Calcium 250 MG CAPS Take 500 mg by mouth daily.     . Cholecalciferol (VITAMIN D PO) Take 3,000 Units by mouth daily.    . cyclobenzaprine (FLEXERIL) 10 MG tablet Take 1 tablet (10 mg total) by mouth 3 (three) times daily as needed for muscle spasms. 30 tablet 0  . diclofenac (VOLTAREN) 75 MG EC tablet Take 1 tablet (75 mg total) by mouth 2 (two) times daily. 60 tablet 0  . esomeprazole (NEXIUM) 40 MG capsule Take 40 mg by mouth daily.  1  . MONO-LINYAH 0.25-35 MG-MCG tablet Take 1 tablet by mouth daily. Reported on 02/27/2016    . [DISCONTINUED] hyoscyamine (LEVSIN SL) 0.125 MG SL tablet Place 1 tablet (0.125 mg total) under the tongue every 8 (eight) hours as needed for cramping. (Patient not taking: Reported on 01/04/2016) 60 tablet 2  . [DISCONTINUED] omeprazole (PRILOSEC) 20 MG capsule Take 1 capsule (20 mg total) by mouth 2 (two) times daily before a meal. (Patient not taking: Reported on 01/04/2016) 30 capsule 0  . [DISCONTINUED] ranitidine (ZANTAC) 150 MG  tablet Take 1 tablet (150 mg total) by mouth 2 (two) times daily.  (Patient not taking: Reported on 01/04/2016) 60 tablet 2  . [DISCONTINUED] sucralfate (CARAFATE) 1 g tablet Take 1 tablet (1 g total) by mouth 4 (four) times daily -  with meals and at bedtime. (Patient not taking: Reported on 01/04/2016) 30 tablet 0   No current facility-administered medications on file prior to visit.   Allergies  Allergen Reactions  . Sulfa Antibiotics Shortness Of Breath and Other (See Comments)    tremors  . Tramadol Other (See Comments)    headaches   Social History   Social History  . Marital Status: Single    Spouse Name: N/A  . Number of Children: 0  . Years of Education: 11   Occupational History  . Student    Social History Main Topics  . Smoking status: Never Smoker   . Smokeless tobacco: Never Used  . Alcohol Use: No  . Drug Use: No  . Sexual Activity: Yes    Birth Control/ Protection: Pill   Other Topics Concern  . Not on file   Social History Narrative   Lives at home with mother and father.   Right-handed.   No caffeine use.   Family History  Problem Relation Age of Onset  . Clotting disorder Maternal Grandfather   . Diabetes Maternal Grandfather   . Heart disease Maternal Grandfather   . Breast cancer Paternal Grandmother   . Diabetes Mother   . Other Mother     gallstones  . Other Sister     pre-diabetes  . Other Sister     gallstones  . Hypertension Mother   . Heart disease Mother   . Hyperlipidemia Father       Review of Systems  HENT: Positive for mouth sores.   All other systems reviewed and are negative.      Objective:   Physical Exam  Constitutional: She is oriented to person, place, and time. She appears well-developed and well-nourished. No distress.  HENT:  Right Ear: External ear normal.  Left Ear: External ear normal.  Nose: Nose normal.  Eyes: Conjunctivae are normal. Right eye exhibits no discharge. Left eye exhibits no discharge. No scleral icterus.  Neck: Neck supple. No JVD present. No tracheal  deviation present. No thyromegaly present.  Cardiovascular: Regular rhythm.  Tachycardia present.   Pulmonary/Chest: Effort normal and breath sounds normal. No respiratory distress. She has no wheezes. She has no rales. She exhibits no tenderness.  Abdominal: Soft. Bowel sounds are normal. She exhibits no distension and no mass. There is no tenderness. There is no rebound and no guarding.  Lymphadenopathy:    She has no cervical adenopathy.  Neurological: She is alert and oriented to person, place, and time. She has normal reflexes. No cranial nerve deficit. She exhibits normal muscle tone. Coordination normal.  Skin: She is not diaphoretic.  Psychiatric: Judgment and thought content normal. Her speech is delayed. She is slowed and withdrawn. Cognition and memory are normal. She exhibits a depressed mood. She is noncommunicative.  Vitals reviewed. Exquisitely tender to palpation over the TMJ joints bilaterally. There is no palpable abnormality or swelling. Refuses to open her mouth.  Therefore I'm unable to examine oropharynx. There is no lymphadenopathy in the neck or palpable masses in the neck. Very odd affect, flat and withdrawn virtually noncommunicative orally shakes her hand and nods her head to questions        Assessment & Plan:  Trigeminal neuralgia pain - Plan: carbamazepine (TEGRETOL-XR) 100 MG 12 hr tablet, CT Soft Tissue Neck W Contrast  Jaw pain - Plan: CT Soft Tissue Neck W Contrast  Dysphagia - Plan: CT Soft Tissue Neck W Contrast  Odynophagia - Plan: CT Soft Tissue Neck W Contrast  Loss of weight - Plan: CT Soft Tissue Neck W Contrast Very difficult case. I am limited in my ability to help her based on my limited knowledge of her past medical history and my previous experience with the patient. My gut instinct is that this is psychosomatic. Her neurologist had recommended Cymbalta but the patient never tried the medication. She has never tried the Flexeril that I gave her  last time to try to relax her muscles. She did try the prednisone it was completely ineffective per her mother's report. Prior to contacting psychiatry, I will arrange an ENT consultation for laryngoscopy given her odynophagia and dysphagia and pain in her neck. Hopefully they can perform this through her nostril fiberoptically and she refuses any examination that I performed. I'll also perform a CT scan of the neck to rule out any physical lesion that could possibly be causing her pain. I will also try her empirically on Tegretol 100 mg by mouth twice a day for possible trigeminal neuralgia as this is the only other physical abnormality I could think that could cause the type of exquisite pain the patient is having around her face. However as I explained to the patient and her family, if the ENT consultation reveals no abnormality, if the CT scan reveals no abnormality, if the Tegretol provides no relief, I really think we need to start looking at this is psychosomatic complaints and possibly consult psychiatry. This is my gut instinct based on the patient's affect and demeanor

## 2016-03-20 ENCOUNTER — Encounter: Payer: Self-pay | Admitting: Family Medicine

## 2016-03-21 ENCOUNTER — Encounter: Payer: Self-pay | Admitting: Family Medicine

## 2016-03-21 MED ORDER — VITAMIN D (ERGOCALCIFEROL) 1.25 MG (50000 UNIT) PO CAPS: 50000.0000 [IU] | ORAL_CAPSULE | ORAL | Status: DC

## 2016-03-22 ENCOUNTER — Encounter: Payer: Self-pay | Admitting: Family Medicine

## 2016-03-22 ENCOUNTER — Ambulatory Visit
Admission: RE | Admit: 2016-03-22 | Discharge: 2016-03-22 | Disposition: A | Discharge status: Home / Self Care | Payer: Commercial Managed Care - HMO | Source: Ambulatory Visit | Attending: Family Medicine | Admitting: Family Medicine

## 2016-03-22 ENCOUNTER — Other Ambulatory Visit: Payer: Self-pay | Admitting: Family Medicine

## 2016-03-22 DIAGNOSIS — G500 Trigeminal neuralgia: Secondary | ICD-10-CM

## 2016-03-22 DIAGNOSIS — R1310 Dysphagia, unspecified: Secondary | ICD-10-CM

## 2016-03-22 DIAGNOSIS — R6884 Jaw pain: Secondary | ICD-10-CM

## 2016-03-22 DIAGNOSIS — R634 Abnormal weight loss: Secondary | ICD-10-CM

## 2016-03-22 MED ORDER — IOPAMIDOL (ISOVUE-300) INJECTION 61%
75.0000 mL | Freq: Once | INTRAVENOUS | Status: AC | PRN
  Administered 2016-03-22: 75 mL via INTRAVENOUS

## 2016-03-23 ENCOUNTER — Encounter: Payer: Commercial Managed Care - HMO | Admitting: Family Medicine

## 2016-03-23 ENCOUNTER — Encounter: Payer: Self-pay | Admitting: Family Medicine

## 2016-03-23 NOTE — Progress Notes (Signed)
Subjective:    Patient ID: Bethany Barrera, female    DOB: 04-03-1996, 20 y.o.   MRN: [id number]  Mouth Lesions  Associated symptoms include mouth sores.  02/27/16 Patient is a very pleasant 20 year old African-American female who presents today to establish care. She has a very complicated chief complaint. Symptoms began approximately 3 months ago. Over the last 3 months she reports losing approximately 60 pounds. This is due to the fact that she is unable to eat. Subjectively she is unable to open her jaw due to pain in the masseter muscles. She also reports diffuse muscle weakness in her arms and in her legs. She reports numbness and tingling in her scalp and in her arms and in her legs. It is not waxing and waning but constant. Her previous PCP and her neurologist have been unable to isolate the cause of her symptoms as of yet. I reviewed the lab work and workup to date including an MRI of the brain that was normal, Lyme studies that were normal, TSH was normal, sedimentation rate slightly high. CRP was slightly high. EMG and nerve conduction studies reveal no evidence of neuropathy or myopathy. She has an appointment to see a rheumatologist on Wednesday. She went to the emergency room recently and was diagnosed with atrial tachycardia presumed secondary to dehydration. They debated starting her on prednisone but the patient denies taking prednisone at the present time. Lab workup in the emergency room was significant for a low potassium. Workup as an outpatient is also significant for low vitamin D.  At that time, my plan was: MRI of the brain and lab workup to date has been normal. At this point I will evaluate for other laboratory causes of diffuse muscle weakness including checking a copper level given her significant weight loss she may have copper deficiency, I will check a vitamin B12, I'll check a phosphate as well as a magnesium level. I will check a sedimentation rate and a CK to see if  these are persistent elevations or spurious one-time elevations. I will check a CK and aldolase to evaluate for any evidence of inflammatory myositis. If lab work suggests inflammatory myositis, I would recommend a muscle biopsy under the care of a rheumatologist to determine if she has an inflammatory myopathy. If lab workup is normal, I would recommend a MRI of the C-spine, T-spine, and L-spine to evaluate for any evidence of spinal cord abnormalities or white matter disease. She has had lab workup for myasthenia gravis which was normal. I would even consider an empiric trial of pyridostigmine.  No visits with results within 2 Week(s) from this visit. Latest known visit with results is:  Office Visit on 02/27/2016  Component Date Value Ref Range Status  . Vitamin B-12 02/27/2016 725  200 - 1100 pg/mL Final  . Aldolase 02/27/2016 2.5  <=8.1 U/L Final  . Total CK 02/27/2016 45  7 - 177 U/L Final  . Sed Rate 02/27/2016 44* 0 - 20 mm/hr Final  . WBC 02/27/2016 5.7  3.8 - 10.8 K/uL Final  . RBC 02/27/2016 5.05  3.80 - 5.10 MIL/uL Final  . Hemoglobin 02/27/2016 13.6  12.0 - 15.0 g/dL Final  . HCT 02/27/2016 40.3  35.0 - 45.0 % Final  . MCV 02/27/2016 79.8* 80.0 - 100.0 fL Final  . MCH 02/27/2016 26.9* 27.0 - 33.0 pg Final  . MCHC 02/27/2016 33.7  32.0 - 36.0 g/dL Final  . RDW 02/27/2016 17.3* 11.0 - 15.0 % Final  .  Platelets 02/27/2016 342  140 - 400 K/uL Final  . MPV 02/27/2016 10.2  7.5 - 12.5 fL Final  . Neutro Abs 02/27/2016 3990  1500 - 7800 cells/uL Final  . Lymphs Abs 02/27/2016 1254  850 - 3900 cells/uL Final  . Monocytes Absolute 02/27/2016 342  200 - 950 cells/uL Final  . Eosinophils Absolute 02/27/2016 114  15 - 500 cells/uL Final  . Basophils Absolute 02/27/2016 0  0 - 200 cells/uL Final  . Neutrophils Relative % 02/27/2016 70   Final  . Lymphocytes Relative 02/27/2016 22   Final  . Monocytes Relative 02/27/2016 6   Final  . Eosinophils Relative 02/27/2016 2   Final  . Basophils  Relative 02/27/2016 0   Final  . Smear Review 02/27/2016 Criteria for review not met   Final   ** Please note change in unit of measure and reference range(s). **  . Sodium 02/27/2016 139  135 - 146 mmol/L Final  . Potassium 02/27/2016 3.6  3.5 - 5.3 mmol/L Final  . Chloride 02/27/2016 101  98 - 110 mmol/L Final  . CO2 02/27/2016 18* 20 - 31 mmol/L Final  . Glucose, Bld 02/27/2016 80  70 - 99 mg/dL Final  . BUN 02/27/2016 6* 7 - 25 mg/dL Final  . Creat 02/27/2016 0.71  0.50 - 1.10 mg/dL Final  . Total Bilirubin 02/27/2016 0.5  0.2 - 1.2 mg/dL Final  . Alkaline Phosphatase 02/27/2016 74  33 - 115 U/L Final  . AST 02/27/2016 13  10 - 30 U/L Final  . ALT 02/27/2016 6  6 - 29 U/L Final  . Total Protein 02/27/2016 7.6  6.1 - 8.1 g/dL Final  . Albumin 02/27/2016 4.2  3.6 - 5.1 g/dL Final  . Calcium 02/27/2016 9.7  8.6 - 10.2 mg/dL Final  . GFR, Est African American 02/27/2016 >89  >=60 mL/min Final  . GFR, Est Non African American 02/27/2016 >89  >=60 mL/min Final   Comment:   The estimated GFR is a calculation valid for adults (>=48 years old) that uses the CKD-EPI algorithm to adjust for age and sex. It is   not to be used for children, pregnant women, hospitalized patients,    patients on dialysis, or with rapidly changing kidney function. According to the NKDEP, eGFR >89 is normal, 60-89 shows mild impairment, 30-59 shows moderate impairment, 15-29 shows severe impairment and <15 is ESRD.     . Magnesium 02/27/2016 1.9  1.5 - 2.5 mg/dL Final  . Phosphorus 02/27/2016 4.3  2.5 - 4.5 mg/dL Final   03/08/16 Patient saw the rheumatologist. I have not received his communication back yet but per the patient's report, the rheumatologist did not feel this is an autoimmune process. She continues to report diffuse muscle weakness and now diffuse muscle pains he continues to have neuropathic paresthesias all over her body. She continues to be unable to open her jaw/mouth due to severe pain in her  TMJ area and muscle tightness which is subjective in her masseter muscles. She continues to demonstrate a very flat affect. She is here today with her mother who does the majority of the talking. Patient will barely respond. She has taken to writing her answers down on a notepad rather than speak because of the pain in her TMJ joints.  She continues to lose weight due to her inability to open her mouth and she also reports trouble swallowing with pain in her upper throat. At that time, my plan was: So far  the only abnormality I can find is a slightly elevated sedimentation rate. I suggested trying a course of prednisone to see if her muscle weakness and muscle pains would improve suggesting an autoimmune process and possibly getting a second opinion with a different rheumatologist. The patient refuses to take prednisone. Therefore I will start her on diclofenac 75 mg by mouth twice a day in addition to Flexeril 10 mg every 8 hours as needed for muscle spasms. I will arrange a second opinion with a neurologist at [facility]. I will also consult ENT given her severe TMJ pain and her sore throat. My clinical impression is that the patient may have psychosomatic complaints however I want to exhaust all possibilities prior to treating her for this.  03/16/16 Since the last time I saw the patient, she has tried a prednisone taper pack with no improvement in her symptoms. She is here today with her mother. She refuses to talk. She refuses to open her mouth. She refuses to even allow me to touch the TMJ joints bilaterally. She has a flat affect and is noncommunicative. Her demeanor is extremely abnormal. Even with maximal coaching and urging the patient will barely open her mouth and speak but she continues to insist on writing everything down or not answering my questions at all. Her primary complaint at the present time is severe pain in both TMJ joints that radiates onto her forehead bilaterally down her jaw  bilaterally towards her mouth. She also complains of pain and pressure radiating up her scalp into the parietal lobe bilaterally. However most of his communication is limited to hand signals and her mother's interpretation of cryptic notes. Continues to lose weight. She is yet to see the neurologist at [facility]. She has yet to see the ENT doctor. Again she refuses to allow me to perform even a rudimentary exam of the oral cavity due to the "pain" she just shakes her head no when I asked her to open her mouth and refuses to allow me to try to passively open her mouth.  At that time, my plan was: Very difficult case. I am limited in my ability to help her based on my limited knowledge of her past medical history and my previous experience with the patient. My gut instinct is that this is psychosomatic. Her neurologist had recommended Cymbalta but the patient never tried the medication. She has never tried the Flexeril that I gave her last time to try to relax her muscles. She did try the prednisone it was completely ineffective per her mother's report. Prior to contacting psychiatry, I will arrange an ENT consultation for laryngoscopy given her odynophagia and dysphagia and pain in her neck. Hopefully they can perform this through her nostril fiberoptically and she refuses any examination that I performed. I'll also perform a CT scan of the neck to rule out any physical lesion that could possibly be causing her pain. I will also try her empirically on Tegretol 100 mg by mouth twice a day for possible trigeminal neuralgia as this is the only other physical abnormality I could think that could cause the type of exquisite pain the patient is having around her face. However as I explained to the patient and her family, if the ENT consultation reveals no abnormality, if the CT scan reveals no abnormality, if the Tegretol provides no relief, I really think we need to start looking at this is psychosomatic complaints and  possibly consult psychiatry. This is my gut instinct based on  the patient's affect and demeanor  03/23/16 CT scan of the neck was completely normal and revealed no physical lesion that would explain her facial pain, odynophagia, or dysphagia.   Past Medical History  Diagnosis Date  . GERD (gastroesophageal reflux disease)   . Anxiety   . Gastritis   . Seasonal allergies   . Muscle weakness    Past Surgical History  Procedure Laterality Date  . No past surgeries     Current Outpatient Prescriptions on File Prior to Visit  Medication Sig Dispense Refill  . Calcium 250 MG CAPS Take 500 mg by mouth daily.     . carbamazepine (TEGRETOL-XR) 100 MG 12 hr tablet Take 1 tablet (100 mg total) by mouth 2 (two) times daily. 60 tablet 1  . Cholecalciferol (VITAMIN D PO) Take 3,000 Units by mouth daily.    . cyclobenzaprine (FLEXERIL) 10 MG tablet Take 1 tablet (10 mg total) by mouth 3 (three) times daily as needed for muscle spasms. 30 tablet 0  . diclofenac (VOLTAREN) 75 MG EC tablet Take 1 tablet (75 mg total) by mouth 2 (two) times daily. 60 tablet 0  . esomeprazole (NEXIUM) 40 MG capsule Take 40 mg by mouth daily.  1  . MONO-LINYAH 0.25-35 MG-MCG tablet Take 1 tablet by mouth daily. Reported on 02/27/2016    . Vitamin D, Ergocalciferol, (DRISDOL) 50000 units CAPS capsule Take 1 capsule (50,000 Units total) by mouth every 7 (seven) days. 4 capsule 11  . [DISCONTINUED] hyoscyamine (LEVSIN SL) 0.125 MG SL tablet Place 1 tablet (0.125 mg total) under the tongue every 8 (eight) hours as needed for cramping. (Patient not taking: Reported on 01/04/2016) 60 tablet 2  . [DISCONTINUED] omeprazole (PRILOSEC) 20 MG capsule Take 1 capsule (20 mg total) by mouth 2 (two) times daily before a meal. (Patient not taking: Reported on 01/04/2016) 30 capsule 0  . [DISCONTINUED] ranitidine (ZANTAC) 150 MG tablet Take 1 tablet (150 mg total) by mouth 2 (two) times daily. (Patient not taking: Reported on 01/04/2016) 60 tablet  2  . [DISCONTINUED] sucralfate (CARAFATE) 1 g tablet Take 1 tablet (1 g total) by mouth 4 (four) times daily -  with meals and at bedtime. (Patient not taking: Reported on 01/04/2016) 30 tablet 0   No current facility-administered medications on file prior to visit.   Allergies  Allergen Reactions  . Sulfa Antibiotics Shortness Of Breath and Other (See Comments)    tremors  . Tramadol Other (See Comments)    headaches   Social History   Social History  . Marital Status: Single    Spouse Name: N/A  . Number of Children: 0  . Years of Education: 11   Occupational History  . Student    Social History Main Topics  . Smoking status: Never Smoker   . Smokeless tobacco: Never Used  . Alcohol Use: No  . Drug Use: No  . Sexual Activity: Yes    Birth Control/ Protection: Pill   Other Topics Concern  . Not on file   Social History Narrative   Lives at home with mother and father.   Right-handed.   No caffeine use.   Family History  Problem Relation Age of Onset  . Clotting disorder Maternal Grandfather   . Diabetes Maternal Grandfather   . Heart disease Maternal Grandfather   . Breast cancer Paternal Grandmother   . Diabetes Mother   . Other Mother     gallstones  . Other Sister  pre-diabetes  . Other Sister     gallstones  . Hypertension Mother   . Heart disease Mother   . Hyperlipidemia Father       Review of Systems  HENT: Positive for mouth sores.   All other systems reviewed and are negative.      Objective:   Physical Exam  Constitutional: She is oriented to person, place, and time. She appears well-developed and well-nourished. No distress.  HENT:  Right Ear: External ear normal.  Left Ear: External ear normal.  Nose: Nose normal.  Eyes: Conjunctivae are normal. Right eye exhibits no discharge. Left eye exhibits no discharge. No scleral icterus.  Neck: Neck supple. No JVD present. No tracheal deviation present. No thyromegaly present.    Cardiovascular: Regular rhythm.  Tachycardia present.   Pulmonary/Chest: Effort normal and breath sounds normal. No respiratory distress. She has no wheezes. She has no rales. She exhibits no tenderness.  Abdominal: Soft. Bowel sounds are normal. She exhibits no distension and no mass. There is no tenderness. There is no rebound and no guarding.  Lymphadenopathy:    She has no cervical adenopathy.  Neurological: She is alert and oriented to person, place, and time. She has normal reflexes. No cranial nerve deficit. She exhibits normal muscle tone. Coordination normal.  Skin: She is not diaphoretic.  Psychiatric: Judgment and thought content normal. Her speech is delayed. She is slowed and withdrawn. Cognition and memory are normal. She exhibits a depressed mood. She is noncommunicative.  Vitals reviewed. Exquisitely tender to palpation over the TMJ joints bilaterally. There is no palpable abnormality or swelling. Refuses to open her mouth.  Therefore I'm unable to examine oropharynx. There is no lymphadenopathy in the neck or palpable masses in the neck. Very odd affect, flat and withdrawn virtually noncommunicative orally shakes her hand and nods her head to questions        Assessment & Plan:    This encounter was created in error - please disregard.

## 2016-03-26 ENCOUNTER — Encounter: Payer: Self-pay | Admitting: Family Medicine

## 2016-03-26 ENCOUNTER — Ambulatory Visit (INDEPENDENT_AMBULATORY_CARE_PROVIDER_SITE_OTHER): Payer: Commercial Managed Care - HMO | Admitting: Family Medicine

## 2016-03-26 VITALS — BP 118/76 | HR 92 | Temp 98.2°F | Resp 14 | Wt 152.0 lb

## 2016-03-26 DIAGNOSIS — E860 Dehydration: Secondary | ICD-10-CM

## 2016-03-26 DIAGNOSIS — G629 Polyneuropathy, unspecified: Secondary | ICD-10-CM | POA: Diagnosis not present

## 2016-03-26 MED ORDER — ONDANSETRON HCL 4 MG PO TABS: 4.0000 mg | ORAL_TABLET | Freq: Three times a day (TID) | ORAL | Status: DC | PRN

## 2016-03-26 MED ORDER — DIAZEPAM 10 MG PO TABS: 10.0000 mg | ORAL_TABLET | Freq: Three times a day (TID) | ORAL | Status: DC | PRN

## 2016-03-26 NOTE — Progress Notes (Signed)
Subjective:    Patient ID: Bethany Barrera, female    DOB: 05/12/1996, 20 y.o.   MRN: [id number]  Mouth Lesions  Associated symptoms include mouth sores.  02/27/16 Patient is a very pleasant 20 year old African-American female who presents today to establish care. She has a very complicated chief complaint. Symptoms began approximately 3 months ago. Over the last 3 months she reports losing approximately 60 pounds. This is due to the fact that she is unable to eat. Subjectively she is unable to open her jaw due to pain in the masseter muscles. She also reports diffuse muscle weakness in her arms and in her legs. She reports numbness and tingling in her scalp and in her arms and in her legs. It is not waxing and waning but constant. Her previous PCP and her neurologist have been unable to isolate the cause of her symptoms as of yet. I reviewed the lab work and workup to date including an MRI of the brain that was normal, Lyme studies that were normal, TSH was normal, sedimentation rate slightly high. CRP was slightly high. EMG and nerve conduction studies reveal no evidence of neuropathy or myopathy. She has an appointment to see a rheumatologist on Wednesday. She went to the emergency room recently and was diagnosed with atrial tachycardia presumed secondary to dehydration. They debated starting her on prednisone but the patient denies taking prednisone at the present time. Lab workup in the emergency room was significant for a low potassium. Workup as an outpatient is also significant for low vitamin D.  At that time, my plan was: MRI of the brain and lab workup to date has been normal. At this point I will evaluate for other laboratory causes of diffuse muscle weakness including checking a copper level given her significant weight loss she may have copper deficiency, I will check a vitamin B12, I'll check a phosphate as well as a magnesium level. I will check a sedimentation rate and a CK to see if  these are persistent elevations or spurious one-time elevations. I will check a CK and aldolase to evaluate for any evidence of inflammatory myositis. If lab work suggests inflammatory myositis, I would recommend a muscle biopsy under the care of a rheumatologist to determine if she has an inflammatory myopathy. If lab workup is normal, I would recommend a MRI of the C-spine, T-spine, and L-spine to evaluate for any evidence of spinal cord abnormalities or white matter disease. She has had lab workup for myasthenia gravis which was normal. I would even consider an empiric trial of pyridostigmine.  No visits with results within 2 Week(s) from this visit. Latest known visit with results is:  Office Visit on 02/27/2016  Component Date Value Ref Range Status  . Vitamin B-12 02/27/2016 725  200 - 1100 pg/mL Final  . Aldolase 02/27/2016 2.5  <=8.1 U/L Final  . Total CK 02/27/2016 45  7 - 177 U/L Final  . Sed Rate 02/27/2016 44* 0 - 20 mm/hr Final  . WBC 02/27/2016 5.7  3.8 - 10.8 K/uL Final  . RBC 02/27/2016 5.05  3.80 - 5.10 MIL/uL Final  . Hemoglobin 02/27/2016 13.6  12.0 - 15.0 g/dL Final  . HCT 02/27/2016 40.3  35.0 - 45.0 % Final  . MCV 02/27/2016 79.8* 80.0 - 100.0 fL Final  . MCH 02/27/2016 26.9* 27.0 - 33.0 pg Final  . MCHC 02/27/2016 33.7  32.0 - 36.0 g/dL Final  . RDW 02/27/2016 17.3* 11.0 - 15.0 % Final  .  Platelets 02/27/2016 342  140 - 400 K/uL Final  . MPV 02/27/2016 10.2  7.5 - 12.5 fL Final  . Neutro Abs 02/27/2016 3990  1500 - 7800 cells/uL Final  . Lymphs Abs 02/27/2016 1254  850 - 3900 cells/uL Final  . Monocytes Absolute 02/27/2016 342  200 - 950 cells/uL Final  . Eosinophils Absolute 02/27/2016 114  15 - 500 cells/uL Final  . Basophils Absolute 02/27/2016 0  0 - 200 cells/uL Final  . Neutrophils Relative % 02/27/2016 70   Final  . Lymphocytes Relative 02/27/2016 22   Final  . Monocytes Relative 02/27/2016 6   Final  . Eosinophils Relative 02/27/2016 2   Final  . Basophils  Relative 02/27/2016 0   Final  . Smear Review 02/27/2016 Criteria for review not met   Final   ** Please note change in unit of measure and reference range(s). **  . Sodium 02/27/2016 139  135 - 146 mmol/L Final  . Potassium 02/27/2016 3.6  3.5 - 5.3 mmol/L Final  . Chloride 02/27/2016 101  98 - 110 mmol/L Final  . CO2 02/27/2016 18* 20 - 31 mmol/L Final  . Glucose, Bld 02/27/2016 80  70 - 99 mg/dL Final  . BUN 02/27/2016 6* 7 - 25 mg/dL Final  . Creat 02/27/2016 0.71  0.50 - 1.10 mg/dL Final  . Total Bilirubin 02/27/2016 0.5  0.2 - 1.2 mg/dL Final  . Alkaline Phosphatase 02/27/2016 74  33 - 115 U/L Final  . AST 02/27/2016 13  10 - 30 U/L Final  . ALT 02/27/2016 6  6 - 29 U/L Final  . Total Protein 02/27/2016 7.6  6.1 - 8.1 g/dL Final  . Albumin 02/27/2016 4.2  3.6 - 5.1 g/dL Final  . Calcium 02/27/2016 9.7  8.6 - 10.2 mg/dL Final  . GFR, Est African American 02/27/2016 >89  >=60 mL/min Final  . GFR, Est Non African American 02/27/2016 >89  >=60 mL/min Final   Comment:   The estimated GFR is a calculation valid for adults (>=43 years old) that uses the CKD-EPI algorithm to adjust for age and sex. It is   not to be used for children, pregnant women, hospitalized patients,    patients on dialysis, or with rapidly changing kidney function. According to the NKDEP, eGFR >89 is normal, 60-89 shows mild impairment, 30-59 shows moderate impairment, 15-29 shows severe impairment and <15 is ESRD.     . Magnesium 02/27/2016 1.9  1.5 - 2.5 mg/dL Final  . Phosphorus 02/27/2016 4.3  2.5 - 4.5 mg/dL Final   03/08/16 Patient saw the rheumatologist. I have not received his communication back yet but per the patient's report, the rheumatologist did not feel this is an autoimmune process. She continues to report diffuse muscle weakness and now diffuse muscle pains he continues to have neuropathic paresthesias all over her body. She continues to be unable to open her jaw/mouth due to severe pain in her  TMJ area and muscle tightness which is subjective in her masseter muscles. She continues to demonstrate a very flat affect. She is here today with her mother who does the majority of the talking. Patient will barely respond. She has taken to writing her answers down on a notepad rather than speak because of the pain in her TMJ joints.  She continues to lose weight due to her inability to open her mouth and she also reports trouble swallowing with pain in her upper throat. At that time, my plan was: So far  the only abnormality I can find is a slightly elevated sedimentation rate. I suggested trying a course of prednisone to see if her muscle weakness and muscle pains would improve suggesting an autoimmune process and possibly getting a second opinion with a different rheumatologist. The patient refuses to take prednisone. Therefore I will start her on diclofenac 75 mg by mouth twice a day in addition to Flexeril 10 mg every 8 hours as needed for muscle spasms. I will arrange a second opinion with a neurologist at [facility]. I will also consult ENT given her severe TMJ pain and her sore throat. My clinical impression is that the patient may have psychosomatic complaints however I want to exhaust all possibilities prior to treating her for this.  03/16/16 Since the last time I saw the patient, she has tried a prednisone taper pack with no improvement in her symptoms. She is here today with her mother. She refuses to talk. She refuses to open her mouth. She refuses to even allow me to touch the TMJ joints bilaterally. She has a flat affect and is noncommunicative. Her demeanor is extremely abnormal. Even with maximal coaching and urging the patient will barely open her mouth and speak but she continues to insist on writing everything down or not answering my questions at all. Her primary complaint at the present time is severe pain in both TMJ joints that radiates onto her forehead bilaterally down her jaw  bilaterally towards her mouth. She also complains of pain and pressure radiating up her scalp into the parietal lobe bilaterally. However most of his communication is limited to hand signals and her mother's interpretation of cryptic notes. Continues to lose weight. She is yet to see the neurologist at [facility]. She has yet to see the ENT doctor. Again she refuses to allow me to perform even a rudimentary exam of the oral cavity due to the "pain" she just shakes her head no when I asked her to open her mouth and refuses to allow me to try to passively open her mouth.  At that time, my plan was: Very difficult case. I am limited in my ability to help her based on my limited knowledge of her past medical history and my previous experience with the patient. My gut instinct is that this is psychosomatic. Her neurologist had recommended Cymbalta but the patient never tried the medication. She has never tried the Flexeril that I gave her last time to try to relax her muscles. She did try the prednisone it was completely ineffective per her mother's report. Prior to contacting psychiatry, I will arrange an ENT consultation for laryngoscopy given her odynophagia and dysphagia and pain in her neck. Hopefully they can perform this through her nostril fiberoptically and she refuses any examination that I performed. I'll also perform a CT scan of the neck to rule out any physical lesion that could possibly be causing her pain. I will also try her empirically on Tegretol 100 mg by mouth twice a day for possible trigeminal neuralgia as this is the only other physical abnormality I could think that could cause the type of exquisite pain the patient is having around her face. However as I explained to the patient and her family, if the ENT consultation reveals no abnormality, if the CT scan reveals no abnormality, if the Tegretol provides no relief, I really think we need to start looking at this is psychosomatic complaints and  possibly consult psychiatry. This is my gut instinct based on  the patient's affect and demeanor  03/26/16 CT scan of the soft tissues of the neck revealed no abnormality to explain the patient's symptoms:  Pharynx and larynx: No suspicious asymmetry or enhancement.  Salivary glands: Normal appearance  Thyroid: Normal  Lymph nodes: None enlarged or low-density  Vascular: Unremarkable  Limited intracranial: Limited imaging is negative  Visualized orbits: Negative  Mastoids and visualized paranasal sinuses: Clear  Skeleton: Negative. No evidence of TMJ arthropathy. The upper posterior margins of the TMJ are not seen, but this area was well encompassed on brain MRI 01/06/2016 and no degenerative or inflammatory changes are present. No atrophy or inflammation in the muscles of mastication. No dental disease.  Upper chest: Clear apical lungs.  IMPRESSION: Negative exam. No explanation for symptoms.  She had an appointment Friday to discuss the results and determine how she was responding to Tegretol but she missed her appointment. She is here today to follow-up.  Patient continues to be unable to talk. Wt Readings from Last 3 Encounters:  03/26/16 152 lb (68.947 kg)  03/16/16 156 lb (70.761 kg)  03/08/16 158 lb (71.668 kg)   He needs to lose weight. She has lost 6 pounds just in the month of June. Her mother states that she's basically drinking Gatorade in the occasional ensure that she is unable to eat anything solid. The patient writes that the pain feels like the muscles in her face or pulling so tight that she is unable to open her jaw. Furthermore TMJ joint is exquisitely tender. She remains noncommunicative. She went to [facility] emergency room over the weekend her labs showed hypokalemia as well as some dehydration. They also suggested several other lab tests including checking a serum copper, red blood cell magnesium level, CRP, and a vitamin B6.  It is suspicious that  they would not check these themselves but this is the patient's report. She has an appointment to see your nose and throat doctor July 5. Apparently [facility] is scheduling her to meet with a neurologist there as soon as possible. Past Medical History  Diagnosis Date  . GERD (gastroesophageal reflux disease)   . Anxiety   . Gastritis   . Seasonal allergies   . Muscle weakness    Past Surgical History  Procedure Laterality Date  . No past surgeries     Current Outpatient Prescriptions on File Prior to Visit  Medication Sig Dispense Refill  . Calcium 250 MG CAPS Take 500 mg by mouth daily.     . carbamazepine (TEGRETOL-XR) 100 MG 12 hr tablet Take 1 tablet (100 mg total) by mouth 2 (two) times daily. 60 tablet 1  . Cholecalciferol (VITAMIN D PO) Take 3,000 Units by mouth daily.    . cyclobenzaprine (FLEXERIL) 10 MG tablet Take 1 tablet (10 mg total) by mouth 3 (three) times daily as needed for muscle spasms. 30 tablet 0  . diclofenac (VOLTAREN) 75 MG EC tablet Take 1 tablet (75 mg total) by mouth 2 (two) times daily. 60 tablet 0  . esomeprazole (NEXIUM) 40 MG capsule Take 40 mg by mouth daily.  1  . MONO-LINYAH 0.25-35 MG-MCG tablet Take 1 tablet by mouth daily. Reported on 02/27/2016    . Vitamin D, Ergocalciferol, (DRISDOL) 50000 units CAPS capsule Take 1 capsule (50,000 Units total) by mouth every 7 (seven) days. 4 capsule 11  . [DISCONTINUED] hyoscyamine (LEVSIN SL) 0.125 MG SL tablet Place 1 tablet (0.125 mg total) under the tongue every 8 (eight) hours as needed for cramping. (Patient  not taking: Reported on 01/04/2016) 60 tablet 2  . [DISCONTINUED] omeprazole (PRILOSEC) 20 MG capsule Take 1 capsule (20 mg total) by mouth 2 (two) times daily before a meal. (Patient not taking: Reported on 01/04/2016) 30 capsule 0  . [DISCONTINUED] ranitidine (ZANTAC) 150 MG tablet Take 1 tablet (150 mg total) by mouth 2 (two) times daily. (Patient not taking: Reported on 01/04/2016) 60 tablet 2  .  [DISCONTINUED] sucralfate (CARAFATE) 1 g tablet Take 1 tablet (1 g total) by mouth 4 (four) times daily -  with meals and at bedtime. (Patient not taking: Reported on 01/04/2016) 30 tablet 0   No current facility-administered medications on file prior to visit.   Allergies  Allergen Reactions  . Sulfa Antibiotics Shortness Of Breath and Other (See Comments)    tremors  . Tramadol Other (See Comments)    headaches   Social History   Social History  . Marital Status: Single    Spouse Name: N/A  . Number of Children: 0  . Years of Education: 11   Occupational History  . Student    Social History Main Topics  . Smoking status: Never Smoker   . Smokeless tobacco: Never Used  . Alcohol Use: No  . Drug Use: No  . Sexual Activity: Yes    Birth Control/ Protection: Pill   Other Topics Concern  . Not on file   Social History Narrative   Lives at home with mother and father.   Right-handed.   No caffeine use.   Family History  Problem Relation Age of Onset  . Clotting disorder Maternal Grandfather   . Diabetes Maternal Grandfather   . Heart disease Maternal Grandfather   . Breast cancer Paternal Grandmother   . Diabetes Mother   . Other Mother     gallstones  . Other Sister     pre-diabetes  . Other Sister     gallstones  . Hypertension Mother   . Heart disease Mother   . Hyperlipidemia Father       Review of Systems  HENT: Positive for mouth sores.   All other systems reviewed and are negative.      Objective:   Physical Exam  Constitutional: She is oriented to person, place, and time. She appears well-developed and well-nourished. No distress.  HENT:  Right Ear: External ear normal.  Left Ear: External ear normal.  Nose: Nose normal.  Eyes: Conjunctivae are normal. Right eye exhibits no discharge. Left eye exhibits no discharge. No scleral icterus.  Neck: Neck supple. No JVD present. No tracheal deviation present. No thyromegaly present.  Cardiovascular:  Regular rhythm.  Tachycardia present.   Pulmonary/Chest: Effort normal and breath sounds normal. No respiratory distress. She has no wheezes. She has no rales. She exhibits no tenderness.  Abdominal: Soft. Bowel sounds are normal. She exhibits no distension and no mass. There is no tenderness. There is no rebound and no guarding.  Lymphadenopathy:    She has no cervical adenopathy.  Neurological: She is alert and oriented to person, place, and time. She has normal reflexes. No cranial nerve deficit. She exhibits normal muscle tone. Coordination normal.  Skin: She is not diaphoretic.  Psychiatric: Judgment and thought content normal. Her speech is delayed. She is slowed and withdrawn. Cognition and memory are normal. She exhibits a depressed mood. She is noncommunicative.  Vitals reviewed. Exquisitely tender to palpation over the TMJ joints bilaterally. There is no palpable abnormality or swelling. Refuses to open her mouth.  Therefore  I'm unable to examine oropharynx. There is no lymphadenopathy in the neck or palpable masses in the neck. Very odd affect, flat and withdrawn virtually noncommunicative only shakes her hand and nods her head to questions        Assessment & Plan:   Neuropathy ([place]) - Plan: Vitamin B6, Copper, Serum, Magnesium, RBC, C-reactive protein  Dehydration - Plan: COMPLETE METABOLIC PANEL WITH GFR  At this point her dehydration and weight loss is becoming dangerous. I recommended ensure 3 cans a day minimum and GATORADE 3-4 times a day to try to maintain hydration and her electrolytes.  Will use Valium 5-10 mg every 8 hours to try to relieve the muscle spasms in her face and in her masseter muscles that she subjectively reports. Flexeril has not worked in the past. If Valium does not work, we could consider baclofen. I also recommended diclofenac 75 mg by mouth twice a day for any inflammation in the jaw joint as I'm concerned she could be developing a frozen joint due to  her unwillingness to open her mouth. She never started the Tegretol due to the pharmacy's inability to order the medication. However I'm going to wait for the results of her lab tests that I'm ordering today. If they are normal, we may want consider the Tegretol versus some other neuropathic pain type medications such as Lyrica or gabapentin. I continue to believe that the symptoms are more psychosomatic however I want to make sure that I have excluded all other possibilities first as she continues to deny any psychiatric symptoms. Also recommended magnesium oxide 400 mg by mouth daily for magnesium replacement.

## 2016-03-27 LAB — COMPLETE METABOLIC PANEL WITH GFR
ALT: 9 U/L (ref 6–29)
AST: 14 U/L (ref 10–30)
Albumin: 3.9 g/dL (ref 3.6–5.1)
Alkaline Phosphatase: 62 U/L (ref 33–115)
BUN: 5 mg/dL — AB (ref 7–25)
CHLORIDE: 99 mmol/L (ref 98–110)
CO2: 26 mmol/L (ref 20–31)
CREATININE: 0.51 mg/dL (ref 0.50–1.10)
Calcium: 9.3 mg/dL (ref 8.6–10.2)
GFR, Est Non African American: >89 mL/min (ref >=60)
GLUCOSE: 101 mg/dL — AB (ref 70–99)
Potassium: 3 mmol/L — ABNORMAL LOW (ref 3.5–5.3)
SODIUM: 141 mmol/L (ref 135–146)
Total Bilirubin: 0.6 mg/dL (ref 0.2–1.2)
Total Protein: 6.8 g/dL (ref 6.1–8.1)

## 2016-03-27 LAB — C-REACTIVE PROTEIN: CRP: 2.5 mg/dL — AB (ref <0.60)

## 2016-03-28 ENCOUNTER — Other Ambulatory Visit: Payer: Self-pay | Admitting: Family Medicine

## 2016-03-28 ENCOUNTER — Encounter: Payer: Self-pay | Admitting: Family Medicine

## 2016-03-28 LAB — MAGNESIUM, RBC: Magnesium RBC: 6.9 mg/dL — ABNORMAL HIGH (ref 4.0–6.4)

## 2016-03-28 MED ORDER — MAGNESIUM OXIDE 400 MG PO TABS: 400.0000 mg | ORAL_TABLET | Freq: Every day | ORAL | Status: DC

## 2016-03-28 MED ORDER — POTASSIUM CHLORIDE ER 10 MEQ PO TBCR: 10.0000 meq | EXTENDED_RELEASE_TABLET | Freq: Every day | ORAL | Status: DC

## 2016-03-29 ENCOUNTER — Encounter: Payer: Self-pay | Admitting: Family Medicine

## 2016-03-30 ENCOUNTER — Encounter: Payer: Self-pay | Admitting: Family Medicine

## 2016-04-02 LAB — VITAMIN B6: Vitamin B6: 2.2 ng/mL (ref 2.1–21.7)

## 2016-04-06 ENCOUNTER — Encounter: Payer: Self-pay | Admitting: Family Medicine

## 2016-04-09 NOTE — Progress Notes (Signed)
Yes, but how can we prevent this?

## 2016-04-10 ENCOUNTER — Encounter: Payer: Self-pay | Admitting: Family Medicine

## 2016-04-13 ENCOUNTER — Encounter: Payer: Self-pay | Admitting: Family Medicine

## 2016-04-17 ENCOUNTER — Encounter: Payer: Self-pay | Admitting: Family Medicine

## 2016-04-17 ENCOUNTER — Ambulatory Visit (INDEPENDENT_AMBULATORY_CARE_PROVIDER_SITE_OTHER): Payer: Commercial Managed Care - HMO | Admitting: Family Medicine

## 2016-04-17 VITALS — BP 122/100 | HR 96 | Temp 98.2°F | Resp 14 | Wt 149.0 lb

## 2016-04-17 DIAGNOSIS — M6281 Muscle weakness (generalized): Secondary | ICD-10-CM

## 2016-04-17 DIAGNOSIS — G629 Polyneuropathy, unspecified: Secondary | ICD-10-CM | POA: Diagnosis not present

## 2016-04-17 NOTE — Progress Notes (Signed)
Subjective:    Patient ID: Bethany Barrera, female    DOB: 1995-11-05, 20 y.o.   MRN: [id number]  Mouth Lesions  Associated symptoms include mouth sores.  02/27/16 Patient is a very pleasant 20 year old African-American female who presents today to establish care. She has a very complicated chief complaint. Symptoms began approximately 3 months ago. Over the last 3 months she reports losing approximately 60 pounds. This is due to the fact that she is unable to eat. Subjectively she is unable to open her jaw due to pain in the masseter muscles. She also reports diffuse muscle weakness in her arms and in her legs. She reports numbness and tingling in her scalp and in her arms and in her legs. It is not waxing and waning but constant. Her previous PCP and her neurologist have been unable to isolate the cause of her symptoms as of yet. I reviewed the lab work and workup to date including an MRI of the brain that was normal, Lyme studies that were normal, TSH was normal, sedimentation rate slightly high. CRP was slightly high. EMG and nerve conduction studies reveal no evidence of neuropathy or myopathy. She has an appointment to see a rheumatologist on Wednesday. She went to the emergency room recently and was diagnosed with atrial tachycardia presumed secondary to dehydration. They debated starting her on prednisone but the patient denies taking prednisone at the present time. Lab workup in the emergency room was significant for a low potassium. Workup as an outpatient is also significant for low vitamin D.  At that time, my plan was: MRI of the brain and lab workup to date has been normal. At this point I will evaluate for other laboratory causes of diffuse muscle weakness including checking a copper level given her significant weight loss she may have copper deficiency, I will check a vitamin B12, I'll check a phosphate as well as a magnesium level. I will check a sedimentation rate and a CK to see if  these are persistent elevations or spurious one-time elevations. I will check a CK and aldolase to evaluate for any evidence of inflammatory myositis. If lab work suggests inflammatory myositis, I would recommend a muscle biopsy under the care of a rheumatologist to determine if she has an inflammatory myopathy. If lab workup is normal, I would recommend a MRI of the C-spine, T-spine, and L-spine to evaluate for any evidence of spinal cord abnormalities or white matter disease. She has had lab workup for myasthenia gravis which was normal. I would even consider an empiric trial of pyridostigmine.  No visits with results within 2 Week(s) from this visit. Latest known visit with results is:  Office Visit on 03/26/2016  Component Date Value Ref Range Status  . Vitamin B6 03/26/2016 2.2  2.1 - 21.7 ng/mL Final   Comment: Vitamin supplementation within 24 hours prior to blood draw may affect the accuracy of the results. This test was developed and its analytical performance characteristics have been determined by [facility] [place], [place]. It has not been cleared or approved by the U.S. Food and Drug Administration. This assay has been validated pursuant to the CLIA regulations and is used for clinical purposes.   . Magnesium RBC 03/26/2016 6.9* 4.0 - 6.4 mg/dL Final   Comment: Assay was repeated and verified. This test was developed and its analytical performance characteristics have been determined by [facility] [place], [place]. It has not been cleared or approved by the U.S. Food  and Drug Administration. This assay has been validated pursuant to the CLIA regulations and is used for clinical purposes.   . CRP 03/26/2016 2.5* <0.60 mg/dL Final  . Sodium 40/98/1191 141  135 - 146 mmol/L Final  . Potassium 03/26/2016 3.0* 3.5 - 5.3 mmol/L Final  . Chloride 03/26/2016 99  98 - 110 mmol/L Final  . CO2 03/26/2016 26  20 - 31 mmol/L Final  .  Glucose, Bld 03/26/2016 101* 70 - 99 mg/dL Final  . BUN 47/82/9562 5* 7 - 25 mg/dL Final  . Creat 13/05/6577 0.51  0.50 - 1.10 mg/dL Final  . Total Bilirubin 03/26/2016 0.6  0.2 - 1.2 mg/dL Final  . Alkaline Phosphatase 03/26/2016 62  33 - 115 U/L Final  . AST 03/26/2016 14  10 - 30 U/L Final  . ALT 03/26/2016 9  6 - 29 U/L Final  . Total Protein 03/26/2016 6.8  6.1 - 8.1 g/dL Final  . Albumin 46/96/2952 3.9  3.6 - 5.1 g/dL Final  . Calcium 84/13/2440 9.3  8.6 - 10.2 mg/dL Final  . GFR, Est African American 03/26/2016 >89  >=60 mL/min Final  . GFR, Est Non African American 03/26/2016 >89  >=60 mL/min Final   03/08/16 Patient saw the rheumatologist. I have not received his communication back yet but per the patient's report, the rheumatologist did not feel this is an autoimmune process. She continues to report diffuse muscle weakness and now diffuse muscle pains he continues to have neuropathic paresthesias all over her body. She continues to be unable to open her jaw/mouth due to severe pain in her TMJ area and muscle tightness which is subjective in her masseter muscles. She continues to demonstrate a very flat affect. She is here today with her mother who does the majority of the talking. Patient will barely respond. She has taken to writing her answers down on a notepad rather than speak because of the pain in her TMJ joints.  She continues to lose weight due to her inability to open her mouth and she also reports trouble swallowing with pain in her upper throat. At that time, my plan was: So far the only abnormality I can find is a slightly elevated sedimentation rate. I suggested trying a course of prednisone to see if her muscle weakness and muscle pains would improve suggesting an autoimmune process and possibly getting a second opinion with a different rheumatologist. The patient refuses to take prednisone. Therefore I will start her on diclofenac 75 mg by mouth twice a day in addition to  Flexeril 10 mg every 8 hours as needed for muscle spasms. I will arrange a second opinion with a neurologist at [facility]. I will also consult ENT given her severe TMJ pain and her sore throat. My clinical impression is that the patient may have psychosomatic complaints however I want to exhaust all possibilities prior to treating her for this.  03/16/16 Since the last time I saw the patient, she has tried a prednisone taper pack with no improvement in her symptoms. She is here today with her mother. She refuses to talk. She refuses to open her mouth. She refuses to even allow me to touch the TMJ joints bilaterally. She has a flat affect and is noncommunicative. Her demeanor is extremely abnormal. Even with maximal coaching and urging the patient will barely open her mouth and speak but she continues to insist on writing everything down or not answering my questions at all. Her primary complaint at the  present time is severe pain in both TMJ joints that radiates onto her forehead bilaterally down her jaw bilaterally towards her mouth. She also complains of pain and pressure radiating up her scalp into the parietal lobe bilaterally. However most of his communication is limited to hand signals and her mother's interpretation of cryptic notes. Continues to lose weight. She is yet to see the neurologist at [facility]. She has yet to see the ENT doctor. Again she refuses to allow me to perform even a rudimentary exam of the oral cavity due to the "pain" she just shakes her head no when I asked her to open her mouth and refuses to allow me to try to passively open her mouth.  At that time, my plan was: Very difficult case. I am limited in my ability to help her based on my limited knowledge of her past medical history and my previous experience with the patient. My gut instinct is that this is psychosomatic. Her neurologist had recommended Cymbalta but the patient never tried the medication. She has never tried the  Flexeril that I gave her last time to try to relax her muscles. She did try the prednisone it was completely ineffective per her mother's report. Prior to contacting psychiatry, I will arrange an ENT consultation for laryngoscopy given her odynophagia and dysphagia and pain in her neck. Hopefully they can perform this through her nostril fiberoptically and she refuses any examination that I performed. I'll also perform a CT scan of the neck to rule out any physical lesion that could possibly be causing her pain. I will also try her empirically on Tegretol 100 mg by mouth twice a day for possible trigeminal neuralgia as this is the only other physical abnormality I could think that could cause the type of exquisite pain the patient is having around her face. However as I explained to the patient and her family, if the ENT consultation reveals no abnormality, if the CT scan reveals no abnormality, if the Tegretol provides no relief, I really think we need to start looking at this is psychosomatic complaints and possibly consult psychiatry. This is my gut instinct based on the patient's affect and demeanor  03/26/16 CT scan of the soft tissues of the neck revealed no abnormality to explain the patient's symptoms:  Pharynx and larynx: No suspicious asymmetry or enhancement.  Salivary glands: Normal appearance  Thyroid: Normal  Lymph nodes: None enlarged or low-density  Vascular: Unremarkable  Limited intracranial: Limited imaging is negative  Visualized orbits: Negative  Mastoids and visualized paranasal sinuses: Clear  Skeleton: Negative. No evidence of TMJ arthropathy. The upper posterior margins of the TMJ are not seen, but this area was well encompassed on brain MRI 01/06/2016 and no degenerative or inflammatory changes are present. No atrophy or inflammation in the muscles of mastication. No dental disease.  Upper chest: Clear apical lungs.  IMPRESSION: Negative exam. No  explanation for symptoms.  She had an appointment Friday to discuss the results and determine how she was responding to Tegretol but she missed her appointment. She is here today to follow-up.  Patient continues to be unable to talk. Wt Readings from Last 3 Encounters:  04/17/16 149 lb (67.586 kg)  03/26/16 152 lb (68.947 kg)  03/16/16 156 lb (70.761 kg)   He needs to lose weight. She has lost 6 pounds just in the month of June. Her mother states that she's basically drinking Gatorade in the occasional ensure that she is unable to eat anything  solid. The patient writes that the pain feels like the muscles in her face or pulling so tight that she is unable to open her jaw. Furthermore TMJ joint is exquisitely tender. She remains noncommunicative. She went to [facility] emergency room over the weekend her labs showed hypokalemia as well as some dehydration. They also suggested several other lab tests including checking a serum copper, red blood cell magnesium level, CRP, and a vitamin B6.  It is suspicious that they would not check these themselves but this is the patient's report. She has an appointment to see your nose and throat doctor July 5. Apparently [facility] is scheduling her to meet with a neurologist there as soon as possible.  At that time, my plan was: At this point her dehydration and weight loss is becoming dangerous. I recommended ensure 3 cans a day minimum and GATORADE 3-4 times a day to try to maintain hydration and her electrolytes.  Will use Valium 5-10 mg every 8 hours to try to relieve the muscle spasms in her face and in her masseter muscles that she subjectively reports. Flexeril has not worked in the past. If Valium does not work, we could consider baclofen. I also recommended diclofenac 75 mg by mouth twice a day for any inflammation in the jaw joint as I'm concerned she could be developing a frozen joint due to her unwillingness to open her mouth. She never started the Tegretol due to  the pharmacy's inability to order the medication. However I'm going to wait for the results of her lab tests that I'm ordering today. If they are normal, we may want consider the Tegretol versus some other neuropathic pain type medications such as Lyrica or gabapentin. I continue to believe that the symptoms are more psychosomatic however I want to make sure that I have excluded all other possibilities first as she continues to deny any psychiatric symptoms. Also recommended magnesium oxide 400 mg by mouth daily for magnesium replacement.  04/17/16 Patient is doing remarkably better. Today she is talking without difficulty. She is now eating. She still reports some soreness in her neck and in her thoracic paraspinal muscles. However she is doing remarkably better. She continues to lose weight although this is stabilized and she states that her appetite is improving and she is starting to eat more. She saw neurology at [facility] and they recommended repeating nerve conduction studies/EMGs. She also saw rheumatology at [facility]. No changes were made in her medicines. She never started Tegretol. She admits that anxiety is probably playing a large role in this. She seems to have responded well to Valium and she is starting to wean herself off the Valium Past Medical History  Diagnosis Date  . GERD (gastroesophageal reflux disease)   . Anxiety   . Gastritis   . Seasonal allergies   . Muscle weakness    Past Surgical History  Procedure Laterality Date  . No past surgeries     Current Outpatient Prescriptions on File Prior to Visit  Medication Sig Dispense Refill  . Calcium 250 MG CAPS Take 500 mg by mouth daily.     . Cholecalciferol (VITAMIN D PO) Take 3,000 Units by mouth daily.    . diazepam (VALIUM) 10 MG tablet Take 1 tablet (10 mg total) by mouth every 8 (eight) hours as needed (for muscle spasm). 30 tablet 1  . diclofenac (VOLTAREN) 75 MG EC tablet Take 1 tablet (75 mg total) by mouth 2 (two) times  daily. 60 tablet 0  .  esomeprazole (NEXIUM) 40 MG capsule Take 40 mg by mouth daily.  1  . magnesium oxide (MAG-OX) 400 MG tablet Take 1 tablet (400 mg total) by mouth daily. 30 tablet 5  . MONO-LINYAH 0.25-35 MG-MCG tablet Take 1 tablet by mouth daily. Reported on 02/27/2016    . ondansetron (ZOFRAN) 4 MG tablet Take 1 tablet (4 mg total) by mouth every 8 (eight) hours as needed for nausea or vomiting. 20 tablet 0  . potassium chloride (K-DUR) 10 MEQ tablet Take 1 tablet (10 mEq total) by mouth daily. 30 tablet 5  . Vitamin D, Ergocalciferol, (DRISDOL) 50000 units CAPS capsule Take 1 capsule (50,000 Units total) by mouth every 7 (seven) days. 4 capsule 11  . carbamazepine (TEGRETOL-XR) 100 MG 12 hr tablet Take 1 tablet (100 mg total) by mouth 2 (two) times daily. (Patient not taking: Reported on 03/26/2016) 60 tablet 1  . [DISCONTINUED] hyoscyamine (LEVSIN SL) 0.125 MG SL tablet Place 1 tablet (0.125 mg total) under the tongue every 8 (eight) hours as needed for cramping. (Patient not taking: Reported on 01/04/2016) 60 tablet 2  . [DISCONTINUED] omeprazole (PRILOSEC) 20 MG capsule Take 1 capsule (20 mg total) by mouth 2 (two) times daily before a meal. (Patient not taking: Reported on 01/04/2016) 30 capsule 0  . [DISCONTINUED] ranitidine (ZANTAC) 150 MG tablet Take 1 tablet (150 mg total) by mouth 2 (two) times daily. (Patient not taking: Reported on 01/04/2016) 60 tablet 2  . [DISCONTINUED] sucralfate (CARAFATE) 1 g tablet Take 1 tablet (1 g total) by mouth 4 (four) times daily -  with meals and at bedtime. (Patient not taking: Reported on 01/04/2016) 30 tablet 0   No current facility-administered medications on file prior to visit.   Allergies  Allergen Reactions  . Sulfa Antibiotics Shortness Of Breath and Other (See Comments)    tremors  . Tramadol Other (See Comments)    headaches   Social History   Social History  . Marital Status: Single    Spouse Name: N/A  . Number of Children: 0  .  Years of Education: 11   Occupational History  . Student    Social History Main Topics  . Smoking status: Never Smoker   . Smokeless tobacco: Never Used  . Alcohol Use: No  . Drug Use: No  . Sexual Activity: Yes    Birth Control/ Protection: Pill   Other Topics Concern  . Not on file   Social History Narrative   Lives at home with mother and father.   Right-handed.   No caffeine use.   Family History  Problem Relation Age of Onset  . Clotting disorder Maternal Grandfather   . Diabetes Maternal Grandfather   . Heart disease Maternal Grandfather   . Breast cancer Paternal Grandmother   . Diabetes Mother   . Other Mother     gallstones  . Other Sister     pre-diabetes  . Other Sister     gallstones  . Hypertension Mother   . Heart disease Mother   . Hyperlipidemia Father       Review of Systems  HENT: Positive for mouth sores.   All other systems reviewed and are negative.      Objective:   Physical Exam  Constitutional: She is oriented to person, place, and time. She appears well-developed and well-nourished. No distress.  HENT:  Right Ear: External ear normal.  Left Ear: External ear normal.  Nose: Nose normal.  Eyes: Conjunctivae are normal. Right  eye exhibits no discharge. Left eye exhibits no discharge. No scleral icterus.  Neck: Neck supple. No JVD present. No tracheal deviation present. No thyromegaly present.  Cardiovascular: Regular rhythm.  Tachycardia present.   Pulmonary/Chest: Effort normal and breath sounds normal. No respiratory distress. She has no wheezes. She has no rales. She exhibits no tenderness.  Abdominal: Soft. Bowel sounds are normal. She exhibits no distension and no mass. There is no tenderness. There is no rebound and no guarding.  Lymphadenopathy:    She has no cervical adenopathy.  Neurological: She is alert and oriented to person, place, and time. She has normal reflexes. No cranial nerve deficit. She exhibits normal muscle  tone. Coordination normal.  Skin: She is not diaphoretic.  Psychiatric: She has a normal mood and affect. Her speech is normal. Judgment and thought content normal. She is not slowed and not withdrawn. Cognition and memory are normal.  Vitals reviewed.         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 However the patient is starting to improve. I recommended weaning off the Valium ear and she can still use ibuprofen as needed for jaw and muscle pain. I will follow-up and checkup Copper level is there been some lab errors on this in the past. Also recheck her sedimentation rate and CRP as I hope these are now trending back to normal.

## 2016-04-18 LAB — BASIC METABOLIC PANEL WITH GFR
BUN: 4 mg/dL — AB (ref 7–25)
CHLORIDE: 102 mmol/L (ref 98–110)
CO2: 25 mmol/L (ref 20–31)
Calcium: 9.4 mg/dL (ref 8.6–10.2)
Creat: 0.54 mg/dL (ref 0.50–1.10)
GFR, Est African American: >89 mL/min (ref >=60)
GFR, Est Non African American: >89 mL/min (ref >=60)
Glucose, Bld: 81 mg/dL (ref 70–99)
POTASSIUM: 3.9 mmol/L (ref 3.5–5.3)
Sodium: 142 mmol/L (ref 135–146)

## 2016-04-18 LAB — C-REACTIVE PROTEIN: CRP: 0.6 mg/dL — ABNORMAL HIGH (ref <0.60)

## 2016-04-18 LAB — SEDIMENTATION RATE: SED RATE: 22 mm/h — AB (ref 0–20)

## 2016-04-20 LAB — COPPER, FREE: Copper - Free, Serum/Plasma: NOT DETECTED mcg/L

## 2016-04-22 ENCOUNTER — Encounter: Payer: Self-pay | Admitting: Family Medicine

## 2016-04-23 ENCOUNTER — Encounter: Payer: Self-pay | Admitting: Family Medicine

## 2016-04-24 ENCOUNTER — Encounter: Payer: Self-pay | Admitting: Family Medicine

## 2016-04-24 ENCOUNTER — Ambulatory Visit (INDEPENDENT_AMBULATORY_CARE_PROVIDER_SITE_OTHER): Payer: Commercial Managed Care - HMO | Admitting: Family Medicine

## 2016-04-24 VITALS — BP 120/90 | Resp 98 | Wt 150.0 lb

## 2016-04-24 DIAGNOSIS — L988 Other specified disorders of the skin and subcutaneous tissue: Secondary | ICD-10-CM | POA: Diagnosis not present

## 2016-04-24 DIAGNOSIS — R238 Other skin changes: Secondary | ICD-10-CM
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
## 2016-05-03 ENCOUNTER — Ambulatory Visit: Payer: Commercial Managed Care - HMO | Admitting: Advanced Practice Midwife

## 2016-05-04 ENCOUNTER — Encounter: Payer: Self-pay | Admitting: Family Medicine

## 2016-05-04 ENCOUNTER — Ambulatory Visit (INDEPENDENT_AMBULATORY_CARE_PROVIDER_SITE_OTHER): Payer: Commercial Managed Care - HMO | Admitting: Family Medicine

## 2016-05-04 VITALS — BP 110/84 | HR 100 | Temp 98.7°F | Resp 22 | Wt 152.0 lb

## 2016-05-04 DIAGNOSIS — L739 Follicular disorder, unspecified: Secondary | ICD-10-CM | POA: Diagnosis not present

## 2016-05-04 DIAGNOSIS — L282 Other prurigo: Secondary | ICD-10-CM | POA: Diagnosis not present

## 2016-05-04 MED ORDER — TRIAMCINOLONE ACETONIDE 0.1 % EX CREA: 1.0000 "application " | TOPICAL_CREAM | Freq: Two times a day (BID) | CUTANEOUS | 0 refills | Status: DC

## 2016-05-04 MED ORDER — DOXYCYCLINE HYCLATE 100 MG PO TABS: 100.0000 mg | ORAL_TABLET | Freq: Two times a day (BID) | ORAL | 0 refills | Status: DC

## 2016-05-04 NOTE — Progress Notes (Signed)
Subjective:    Patient ID: Bethany Barrera, female    DOB: 07/16/1996, 20 y.o.   MRN: [id number]  HPI  2 days ago, the patient went over to visit her friends. She was laying on the couch. Ever since that time she has had a cluster of approximately 30 small erythematous papules approximately 1-2 mm in size underneath her right arm and on her right flank where she was laying. These are consistent with papular urticaria. I suspect insect bites possibly flea bites. The house that she was visiting has 2 dogs that live indoors and may have fleas. However several of the bites are now infected and are small punctate pustules that are erythematous and sore. These appear to be secondarily infected from where the patient was scratching at the bites. Past Medical History:  Diagnosis Date  . Anxiety   . Gastritis   . GERD (gastroesophageal reflux disease)   . Muscle weakness   . Seasonal allergies    Past Surgical History:  Procedure Laterality Date  . NO PAST SURGERIES     Current Outpatient Prescriptions on File Prior to Visit  Medication Sig Dispense Refill  . Calcium 250 MG CAPS Take 500 mg by mouth daily.     . Cholecalciferol (VITAMIN D PO) Take 3,000 Units by mouth daily.    . diazepam (VALIUM) 10 MG tablet Take 1 tablet (10 mg total) by mouth every 8 (eight) hours as needed (for muscle spasm). 30 tablet 1  . esomeprazole (NEXIUM) 40 MG capsule Take 40 mg by mouth daily.  1  . MONO-LINYAH 0.25-35 MG-MCG tablet Take 1 tablet by mouth daily. Reported on 02/27/2016    . ondansetron (ZOFRAN) 4 MG tablet Take 1 tablet (4 mg total) by mouth every 8 (eight) hours as needed for nausea or vomiting. 20 tablet 0  . potassium chloride (K-DUR) 10 MEQ tablet Take 1 tablet (10 mEq total) by mouth daily. 30 tablet 5  . [DISCONTINUED] hyoscyamine (LEVSIN SL) 0.125 MG SL tablet Place 1 tablet (0.125 mg total) under the tongue every 8 (eight) hours as needed for cramping. (Patient not taking: Reported on  01/04/2016) 60 tablet 2  . [DISCONTINUED] omeprazole (PRILOSEC) 20 MG capsule Take 1 capsule (20 mg total) by mouth 2 (two) times daily before a meal. (Patient not taking: Reported on 01/04/2016) 30 capsule 0  . [DISCONTINUED] ranitidine (ZANTAC) 150 MG tablet Take 1 tablet (150 mg total) by mouth 2 (two) times daily. (Patient not taking: Reported on 01/04/2016) 60 tablet 2  . [DISCONTINUED] sucralfate (CARAFATE) 1 g tablet Take 1 tablet (1 g total) by mouth 4 (four) times daily -  with meals and at bedtime. (Patient not taking: Reported on 01/04/2016) 30 tablet 0   No current facility-administered medications on file prior to visit.    Allergies  Allergen Reactions  . Sulfa Antibiotics Shortness Of Breath and Other (See Comments)    tremors  . Tramadol Other (See Comments)    headaches   Social History   Social History  . Marital status: Single    Spouse name: N/A  . Number of children: 0  . Years of education: 4   Occupational History  . Student    Social History Main Topics  . Smoking status: Never Smoker  . Smokeless tobacco: Never Used  . Alcohol use No  . Drug use: No  . Sexual activity: Yes    Birth control/ protection: Pill   Other Topics Concern  . Not on  file   Social History Narrative   Lives at home with mother and father.   Right-handed.   No caffeine use.      Review of Systems  All other systems reviewed and are negative.      Objective:   Physical Exam  Cardiovascular: Normal rate, regular rhythm and normal heart sounds.   Pulmonary/Chest: Effort normal and breath sounds normal.  Skin: Rash noted. There is erythema.  Vitals reviewed.         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 Use triamcinolone cream twice daily for itching and to calm down the irritation. Patient was  also spontaneously on her own. Several of the bites have become infected and turning to folliculitis. Begin doxycycline 100 mg by mouth twice a day for 7 days

## 2016-05-10 ENCOUNTER — Ambulatory Visit: Payer: Commercial Managed Care - HMO | Admitting: Advanced Practice Midwife

## 2016-05-22 ENCOUNTER — Encounter: Payer: Self-pay | Admitting: Advanced Practice Midwife

## 2016-05-22 ENCOUNTER — Ambulatory Visit (INDEPENDENT_AMBULATORY_CARE_PROVIDER_SITE_OTHER): Payer: Commercial Managed Care - HMO | Admitting: Advanced Practice Midwife

## 2016-05-22 VITALS — BP 100/62 | HR 84 | Ht 65.5 in | Wt 157.0 lb

## 2016-05-22 DIAGNOSIS — A499 Bacterial infection, unspecified: Secondary | ICD-10-CM | POA: Diagnosis not present

## 2016-05-22 DIAGNOSIS — N898 Other specified noninflammatory disorders of vagina: Secondary | ICD-10-CM | POA: Diagnosis not present

## 2016-05-22 DIAGNOSIS — N760 Acute vaginitis: Secondary | ICD-10-CM | POA: Diagnosis not present

## 2016-05-22 DIAGNOSIS — Z113 Encounter for screening for infections with a predominantly sexual mode of transmission: Secondary | ICD-10-CM

## 2016-05-22 DIAGNOSIS — B9689 Other specified bacterial agents as the cause of diseases classified elsewhere: Secondary | ICD-10-CM

## 2016-05-22 MED ORDER — METRONIDAZOLE 0.75 % VA GEL: 1.0000 | Freq: Every day | VAGINAL | 1 refills | Status: DC

## 2016-05-22 NOTE — Progress Notes (Signed)
[facility] Visit  Patient name: Bethany Barrera MRN [id number]  Date of birth: 05-03-1996  CC & HPI:  Bethany Barrera is a 20 y.o. African American female presenting today for STD screen, vaginal dc w/fishy odor.  She states she didn't use condoms "once" with her boyfriend.  Has had increased dc w/fishy odor for 2 weeks.  Denies pain, does have some itch/irritaiton.  Does not want pills if possible. Just finished 1 weeks of doxycycline for folliciulites from flea bites.   Pertinent History Reviewed:  Medical & Surgical Hx:   Past Medical History:  Diagnosis Date  . Anxiety   . Gastritis   . GERD (gastroesophageal reflux disease)   . Muscle weakness   . Seasonal allergies    Past Surgical History:  Procedure Laterality Date  . NO PAST SURGERIES     Family History  Problem Relation Age of Onset  . Clotting disorder Maternal Grandfather   . Diabetes Maternal Grandfather   . Heart disease Maternal Grandfather   . Breast cancer Paternal Grandmother   . Diabetes Mother   . Other Mother     gallstones  . Hypertension Mother   . Heart disease Mother   . Hyperlipidemia Father   . Other Sister     pre-diabetes  . Other Sister     gallstones    Current Outpatient Prescriptions:  .  Cholecalciferol (VITAMIN D PO), Take 3,000 Units by mouth once a week. , Disp: , Rfl:  .  diazepam (VALIUM) 10 MG tablet, Take 1 tablet (10 mg total) by mouth every 8 (eight) hours as needed (for muscle spasm)., Disp: 30 tablet, Rfl: 1 .  esomeprazole (NEXIUM) 40 MG capsule, Take 40 mg by mouth daily., Disp: , Rfl: 1 .  ondansetron (ZOFRAN) 4 MG tablet, Take 1 tablet (4 mg total) by mouth every 8 (eight) hours as needed for nausea or vomiting., Disp: 20 tablet, Rfl: 0 .  potassium chloride (K-DUR) 10 MEQ tablet, Take 1 tablet (10 mEq total) by mouth daily., Disp: 30 tablet, Rfl: 5 .  metroNIDAZOLE (METROGEL VAGINAL) 0.75 % vaginal gel, Place 1 Applicatorful vaginally at bedtime., Disp:  70 g, Rfl: 1 Social History: Reviewed -  reports that she has never smoked. She has never used smokeless tobacco.  Review of Systems:   Constitutional: Negative for fever and chills Eyes: Negative for visual disturbances Respiratory: Negative for shortness of breath, dyspnea Cardiovascular: Negative for chest pain or palpitations  Gastrointestinal: Negative for vomiting, diarrhea and constipation; no abdominal pain Genitourinary: Negative for dysuria and urgency, vaginal irritation or itching Musculoskeletal: Negative for back pain, joint pain, myalgias  Neurological: Negative for dizziness and headaches    Objective Findings:    Physical Examination: General appearance - well appearing, and in no distress Mental status - alert, oriented to person, place, and time Chest:  Normal respiratory effort Heart - normal rate and regular rhythm Abdomen:  Soft, nontender Pelvic: SSE:  Thin white frothy DC w/aminie odor.  Wet prep:  TNTC WBC, + clue, no trich seen Musculoskeletal:  Normal range of motion without pain Extremities:  No edema    No results found for this or any previous visit (from the past 24 hour(s)).    Assessment & Plan:  A:   BV, leukorrhea (despite doxycycline) P:Rx metrogel   Orders Placed This Encounter  Procedures  . GC/Chlamydia Probe Amp  . Trichomonas vaginalis, RNA  . HIV antibody  . RPR  Return if symptoms worsen or fail to improve.  CRESENZO-DISHMAN,Librado Guandique CNM 05/22/2016 11:58 AM

## 2016-05-23 LAB — GC/CHLAMYDIA PROBE AMP
Chlamydia trachomatis, NAA: NEGATIVE
Neisseria gonorrhoeae by PCR: NEGATIVE

## 2016-05-23 LAB — RPR: RPR: NONREACTIVE

## 2016-05-23 LAB — HIV ANTIBODY (ROUTINE TESTING W REFLEX): HIV Screen 4th Generation wRfx: NONREACTIVE

## 2016-07-31 ENCOUNTER — Encounter: Payer: Self-pay | Admitting: Family Medicine

## 2016-07-31 ENCOUNTER — Ambulatory Visit (INDEPENDENT_AMBULATORY_CARE_PROVIDER_SITE_OTHER): Payer: Commercial Managed Care - HMO | Admitting: Family Medicine

## 2016-07-31 VITALS — BP 108/64 | HR 76 | Temp 98.3°F | Resp 16 | Wt 173.0 lb

## 2016-07-31 DIAGNOSIS — Z8639 Personal history of other endocrine, nutritional and metabolic disease: Secondary | ICD-10-CM

## 2016-07-31 DIAGNOSIS — M6281 Muscle weakness (generalized): Secondary | ICD-10-CM

## 2016-07-31 DIAGNOSIS — F450 Somatization disorder: Secondary | ICD-10-CM

## 2016-07-31 DIAGNOSIS — F411 Generalized anxiety disorder: Secondary | ICD-10-CM

## 2016-07-31 NOTE — Progress Notes (Signed)
Subjective:    Patient ID: Bethany Barrera, female    DOB: 27-Sep-1996, 20 y.o.   MRN: [id number]  Mouth Lesions   Associated symptoms include mouth sores.  02/27/16 Patient is a very pleasant 20 year old African-American female who presents today to establish care. She has a very complicated chief complaint. Symptoms began approximately 3 months ago. Over the last 3 months she reports losing approximately 60 pounds. This is due to the fact that she is unable to eat. Subjectively she is unable to open her jaw due to pain in the masseter muscles. She also reports diffuse muscle weakness in her arms and in her legs. She reports numbness and tingling in her scalp and in her arms and in her legs. It is not waxing and waning but constant. Her previous PCP and her neurologist have been unable to isolate the cause of her symptoms as of yet. I reviewed the lab work and workup to date including an MRI of the brain that was normal, Lyme studies that were normal, TSH was normal, sedimentation rate slightly high. CRP was slightly high. EMG and nerve conduction studies reveal no evidence of neuropathy or myopathy. She has an appointment to see a rheumatologist on Wednesday. She went to the emergency room recently and was diagnosed with atrial tachycardia presumed secondary to dehydration. They debated starting her on prednisone but the patient denies taking prednisone at the present time. Lab workup in the emergency room was significant for a low potassium. Workup as an outpatient is also significant for low vitamin D.  At that time, my plan was: MRI of the brain and lab workup to date has been normal. At this point I will evaluate for other laboratory causes of diffuse muscle weakness including checking a copper level given her significant weight loss she may have copper deficiency, I will check a vitamin B12, I'll check a phosphate as well as a magnesium level. I will check a sedimentation rate and a CK to see if  these are persistent elevations or spurious one-time elevations. I will check a CK and aldolase to evaluate for any evidence of inflammatory myositis. If lab work suggests inflammatory myositis, I would recommend a muscle biopsy under the care of a rheumatologist to determine if she has an inflammatory myopathy. If lab workup is normal, I would recommend a MRI of the C-spine, T-spine, and L-spine to evaluate for any evidence of spinal cord abnormalities or white matter disease. She has had lab workup for myasthenia gravis which was normal. I would even consider an empiric trial of pyridostigmine.  No visits with results within 2 Week(s) from this visit.  Latest known visit with results is:  Office Visit on 05/22/2016  Component Date Value Ref Range Status  . Chlamydia trachomatis, NAA 05/23/2016 Negative  Negative Final  . Neisseria gonorrhoeae by PCR 05/23/2016 Negative  Negative Final  . HIV Screen 4th Generation wRfx 05/23/2016 Non Reactive  Non Reactive Final  . RPR Ser Ql 05/23/2016 Non Reactive  Non Reactive Final   03/08/16 Patient saw the rheumatologist. I have not received his communication back yet but per the patient's report, the rheumatologist did not feel this is an autoimmune process. She continues to report diffuse muscle weakness and now diffuse muscle pains he continues to have neuropathic paresthesias all over her body. She continues to be unable to open her jaw/mouth due to severe pain in her TMJ area and muscle tightness which is subjective in her masseter muscles. She continues  to demonstrate a very flat affect. She is here today with her mother who does the majority of the talking. Patient will barely respond. She has taken to writing her answers down on a notepad rather than speak because of the pain in her TMJ joints.  She continues to lose weight due to her inability to open her mouth and she also reports trouble swallowing with pain in her upper throat. At that time, my plan  was: So far the only abnormality I can find is a slightly elevated sedimentation rate. I suggested trying a course of prednisone to see if her muscle weakness and muscle pains would improve suggesting an autoimmune process and possibly getting a second opinion with a different rheumatologist. The patient refuses to take prednisone. Therefore I will start her on diclofenac 75 mg by mouth twice a day in addition to Flexeril 10 mg every 8 hours as needed for muscle spasms. I will [facility] a [facility]. I will also consult ENT given her severe TMJ pain and her sore throat. My clinical impression is that the patient may have psychosomatic complaints however I want to exhaust all possibilities prior to treating her for this.  03/16/16 Since the last time I saw the patient, she has tried a prednisone taper pack with no improvement in her symptoms. She is here today with her mother. She refuses to talk. She refuses to open her mouth. She refuses to even allow me to touch the TMJ joints bilaterally. She has a flat affect and is noncommunicative. Her demeanor is extremely abnormal. Even with maximal coaching and urging the patient will barely open her mouth and speak but she continues to insist on writing everything down or not answering my questions at all. Her primary complaint at the present time is severe pain in both TMJ joints that radiates onto her forehead bilaterally down her jaw bilaterally towards her mouth. She also complains of pain and pressure radiating up her scalp into the parietal lobe bilaterally. However most of his communication is limited to hand signals and her mother's interpretation of cryptic notes. Continues to lose weight. She is yet to see the neurologist at [facility]. She has yet to see the ENT doctor. Again she refuses to allow me to perform even a rudimentary exam of the oral cavity due to the "pain" she just shakes her head no when I asked her to open her  mouth and refuses to allow me to try to passively open her mouth.  At that time, my plan was: Very difficult case. I am limited in my ability to help her based on my limited knowledge of her past medical history and my previous experience with the patient. My gut instinct is that this is psychosomatic. Her neurologist had recommended Cymbalta but the patient never tried the medication. She has never tried the Flexeril that I gave her last time to try to relax her muscles. She did try the prednisone it was completely ineffective per her mother's report. Prior to contacting psychiatry, I will arrange an ENT consultation for laryngoscopy given her odynophagia and dysphagia and pain in her neck. Hopefully they can perform this through her nostril fiberoptically and she refuses any examination that I performed. I'll also perform a CT scan of the neck to rule out any physical lesion that could possibly be causing her pain. I will also try her empirically on Tegretol 100 mg by mouth twice a day for possible trigeminal neuralgia  as this is the only other physical abnormality I could think that could cause the type of exquisite pain the patient is having around her face. However as I explained to the patient and her family, if the ENT consultation reveals no abnormality, if the CT scan reveals no abnormality, if the Tegretol provides no relief, I really think we need to start looking at this is psychosomatic complaints and possibly consult psychiatry. This is my gut instinct based on the patient's affect and demeanor  07/31/16 Shortly after our last visit, the patient started to improve. The muscle weakness and muscle soreness gradually resolved. Even the patient admitted that she felt it was due to her anxiety. We've had several visits since that time the patient has been very polite and pleasant. However she presents today with her mother and states that her symptoms are starting to return. She complains of muscle  tightness and soreness and stiffness in her masseter muscles bilaterally in her shoulders and in her neck. She also complains of abdominal pain and abdominal cramping. Independently, she saw a neurologist at [facility] who again performed EMG and nerve conduction studies which came back normal according to the patient. I have no written records from [facility]. They have her scheduled for an MRI of the brain. I spent approximately 30 minutes with the patient today answering her questions. Past Medical History:  Diagnosis Date  . Anxiety   . Gastritis   . GERD (gastroesophageal reflux disease)   . Muscle weakness   . Seasonal allergies    Past Surgical History:  Procedure Laterality Date  . NO PAST SURGERIES     Current Outpatient Prescriptions on File Prior to Visit  Medication Sig Dispense Refill  . Cholecalciferol (VITAMIN D PO) Take 3,000 Units by mouth once a week.     . esomeprazole (NEXIUM) 40 MG capsule Take 40 mg by mouth daily.  1  . potassium chloride (K-DUR) 10 MEQ tablet Take 1 tablet (10 mEq total) by mouth daily. 30 tablet 5  . diazepam (VALIUM) 10 MG tablet Take 1 tablet (10 mg total) by mouth every 8 (eight) hours as needed (for muscle spasm). (Patient not taking: Reported on 07/31/2016) 30 tablet 1  . metroNIDAZOLE (METROGEL VAGINAL) 0.75 % vaginal gel Place 1 Applicatorful vaginally at bedtime. (Patient not taking: Reported on 07/31/2016) 70 g 1  . ondansetron (ZOFRAN) 4 MG tablet Take 1 tablet (4 mg total) by mouth every 8 (eight) hours as needed for nausea or vomiting. (Patient not taking: Reported on 07/31/2016) 20 tablet 0  . [DISCONTINUED] hyoscyamine (LEVSIN SL) 0.125 MG SL tablet Place 1 tablet (0.125 mg total) under the tongue every 8 (eight) hours as needed for cramping. (Patient not taking: Reported on 01/04/2016) 60 tablet 2  . [DISCONTINUED] omeprazole (PRILOSEC) 20 MG capsule Take 1 capsule (20 mg total) by mouth 2 (two) times daily before a meal. (Patient not taking:  Reported on 01/04/2016) 30 capsule 0  . [DISCONTINUED] ranitidine (ZANTAC) 150 MG tablet Take 1 tablet (150 mg total) by mouth 2 (two) times daily. (Patient not taking: Reported on 01/04/2016) 60 tablet 2  . [DISCONTINUED] sucralfate (CARAFATE) 1 g tablet Take 1 tablet (1 g total) by mouth 4 (four) times daily -  with meals and at bedtime. (Patient not taking: Reported on 01/04/2016) 30 tablet 0   No current facility-administered medications on file prior to visit.    Allergies  Allergen Reactions  . Sulfa Antibiotics Shortness Of Breath and Other (See Comments)  tremors  . Tramadol Other (See Comments)    headaches   Social History   Social History  . Marital status: Single    Spouse name: N/A  . Number of children: 0  . Years of education: 82   Occupational History  . Student    Social History Main Topics  . Smoking status: Never Smoker  . Smokeless tobacco: Never Used  . Alcohol use No  . Drug use: No  . Sexual activity: Yes    Partners: Male   Other Topics Concern  . Not on file   Social History Narrative   Lives at home with mother and father.   Right-handed.   No caffeine use.   Family History  Problem Relation Age of Onset  . Clotting disorder Maternal Grandfather   . Diabetes Maternal Grandfather   . Heart disease Maternal Grandfather   . Breast cancer Paternal Grandmother   . Diabetes Mother   . Other Mother     gallstones  . Hypertension Mother   . Heart disease Mother   . Hyperlipidemia Father   . Other Sister     pre-diabetes  . Other Sister     gallstones      Review of Systems  HENT: Positive for mouth sores.   All other systems reviewed and are negative.      Objective:   Physical Exam  Constitutional: She is oriented to person, place, and time. She appears well-developed and well-nourished. No distress.  HENT:  Right Ear: External ear normal.  Left Ear: External ear normal.  Nose: Nose normal.  Eyes: Conjunctivae are normal.  Right eye exhibits no discharge. Left eye exhibits no discharge. No scleral icterus.  Neck: Neck supple. No JVD present. No tracheal deviation present. No thyromegaly present.  Cardiovascular: Regular rhythm.  Tachycardia present.   Pulmonary/Chest: Effort normal and breath sounds normal. No respiratory distress. She has no wheezes. She has no rales. She exhibits no tenderness.  Abdominal: Soft. Bowel sounds are normal. She exhibits no distension and no mass. There is no tenderness. There is no rebound and no guarding.  Lymphadenopathy:    She has no cervical adenopathy.  Neurological: She is alert and oriented to person, place, and time. She has normal reflexes. No cranial nerve deficit. She exhibits normal muscle tone. Coordination normal.  Skin: She is not diaphoretic.  Psychiatric: Cognition and memory are normal.  Vitals reviewed.        Assessment & Plan:  History of low potassium - Plan: BASIC METABOLIC PANEL WITH GFR  Muscle weakness (generalized)  Somatization disorder  GAD (generalized anxiety disorder)   I truly believe this patient has somatization disorder. I do not believe she is malingering. I believe her muscle stiffness and muscle pains and weakness and abdominal symptoms are all due to uncontrolled anxiety. She seems to respond well to Valium but she uses it sparingly due to her concern of addiction and dependency.  I agree that this is a valid concern. I explained in detail to the patient that I believe she would benefit from an SSRI such as Zoloft or Prozac to better help manage her anxiety. I believe if we manage her anxiety better, she will not have as many somatic complaints and also believe her quality of life would be better. She is hesitant to start medication at this point. She would like to follow-up with the neurologist for the MRI first. I will check a potassium level of the patient's here today to  determine if she needs to continue potassium repletion. More  than 30 minutes was spent in conversation with the patient and her mother answering questions

## 2016-12-04 ENCOUNTER — Ambulatory Visit (INDEPENDENT_AMBULATORY_CARE_PROVIDER_SITE_OTHER): Payer: Commercial Managed Care - HMO | Admitting: Family Medicine

## 2016-12-04 VITALS — BP 120/60 | HR 74 | Temp 98.2°F | Resp 16 | Ht 65.0 in | Wt 195.0 lb

## 2016-12-04 DIAGNOSIS — F411 Generalized anxiety disorder: Secondary | ICD-10-CM

## 2016-12-04 DIAGNOSIS — M62838 Other muscle spasm: Secondary | ICD-10-CM | POA: Diagnosis not present

## 2016-12-04 DIAGNOSIS — E559 Vitamin D deficiency, unspecified: Secondary | ICD-10-CM

## 2016-12-04 DIAGNOSIS — Z114 Encounter for screening for human immunodeficiency virus [HIV]: Secondary | ICD-10-CM | POA: Diagnosis not present

## 2016-12-04 MED ORDER — DIAZEPAM 10 MG PO TABS: 10.0000 mg | ORAL_TABLET | Freq: Three times a day (TID) | ORAL | 1 refills | Status: DC | PRN

## 2016-12-04 NOTE — Progress Notes (Signed)
Subjective:    Patient ID: Bethany Barrera, female    DOB: 1996/06/13, 21 y.o.   MRN: [id number]  Mouth Lesions   Associated symptoms include mouth sores.  02/27/16 Patient is a very pleasant 21 year old African-American female who presents today to establish care. She has a very complicated chief complaint. Symptoms began approximately 3 months ago. Over the last 3 months she reports losing approximately 60 pounds. This is due to the fact that she is unable to eat. Subjectively she is unable to open her jaw due to pain in the masseter muscles. She also reports diffuse muscle weakness in her arms and in her legs. She reports numbness and tingling in her scalp and in her arms and in her legs. It is not waxing and waning but constant. Her previous PCP and her neurologist have been unable to isolate the cause of her symptoms as of yet. I reviewed the lab work and workup to date including an MRI of the brain that was normal, Lyme studies that were normal, TSH was normal, sedimentation rate slightly high. CRP was slightly high. EMG and nerve conduction studies reveal no evidence of neuropathy or myopathy. She has an appointment to see a rheumatologist on Wednesday. She went to the emergency room recently and was diagnosed with atrial tachycardia presumed secondary to dehydration. They debated starting her on prednisone but the patient denies taking prednisone at the present time. Lab workup in the emergency room was significant for a low potassium. Workup as an outpatient is also significant for low vitamin D.  At that time, my plan was: MRI of the brain and lab workup to date has been normal. At this point I will evaluate for other laboratory causes of diffuse muscle weakness including checking a copper level given her significant weight loss she may have copper deficiency, I will check a vitamin B12, I'll check a phosphate as well as a magnesium level. I will check a sedimentation rate and a CK to see if  these are persistent elevations or spurious one-time elevations. I will check a CK and aldolase to evaluate for any evidence of inflammatory myositis. If lab work suggests inflammatory myositis, I would recommend a muscle biopsy under the care of a rheumatologist to determine if she has an inflammatory myopathy. If lab workup is normal, I would recommend a MRI of the C-spine, T-spine, and L-spine to evaluate for any evidence of spinal cord abnormalities or white matter disease. She has had lab workup for myasthenia gravis which was normal. I would even consider an empiric trial of pyridostigmine.  No visits with results within 2 Week(s) from this visit.  Latest known visit with results is:  Office Visit on 05/22/2016  Component Date Value Ref Range Status  . Chlamydia trachomatis, NAA 05/22/2016 Negative  Negative Final  . Neisseria gonorrhoeae by PCR 05/22/2016 Negative  Negative Final  . HIV Screen 4th Generation wRfx 05/22/2016 Non Reactive  Non Reactive Final  . RPR Ser Ql 05/22/2016 Non Reactive  Non Reactive Final   03/08/16 Patient saw the rheumatologist. I have not received his communication back yet but per the patient's report, the rheumatologist did not feel this is an autoimmune process. She continues to report diffuse muscle weakness and now diffuse muscle pains he continues to have neuropathic paresthesias all over her body. She continues to be unable to open her jaw/mouth due to severe pain in her TMJ area and muscle tightness which is subjective in her masseter muscles. She continues  to demonstrate a very flat affect. She is here today with her mother who does the majority of the talking. Patient will barely respond. She has taken to writing her answers down on a notepad rather than speak because of the pain in her TMJ joints.  She continues to lose weight due to her inability to open her mouth and she also reports trouble swallowing with pain in her upper throat. At that time, my plan  was: So far the only abnormality I can find is a slightly elevated sedimentation rate. I suggested trying a course of prednisone to see if her muscle weakness and muscle pains would improve suggesting an autoimmune process and possibly getting a second opinion with a different rheumatologist. The patient refuses to take prednisone. Therefore I will start her on diclofenac 75 mg by mouth twice a day in addition to Flexeril 10 mg every 8 hours as needed for muscle spasms. I will arrange a [facility]. I will also consult ENT given her severe TMJ pain and her sore throat. My clinical impression is that the patient may have psychosomatic complaints however I want to exhaust all possibilities prior to treating her for this.  03/16/16 Since the last time I saw the patient, she has tried a prednisone taper pack with no improvement in her symptoms. She is here today with her mother. She refuses to talk. She refuses to open her mouth. She refuses to even allow me to touch the TMJ joints bilaterally. She has a flat affect and is noncommunicative. Her demeanor is extremely abnormal. Even with maximal coaching and urging the patient will barely open her mouth and speak but she continues to insist on writing everything down or not answering my questions at all. Her primary complaint at the present time is severe pain in both TMJ joints that radiates onto her forehead bilaterally down her jaw bilaterally towards her mouth. She also complains of pain and pressure radiating up her scalp into the parietal lobe bilaterally. However most of his communication is limited to hand signals and her mother's interpretation of cryptic notes. Continues to lose weight. She is yet to see the neurologist at [facility]. She has yet to see the ENT doctor. Again she refuses to allow me to perform even a rudimentary exam of the oral cavity due to the "pain" she just shakes her head no when I asked her to open her  mouth and refuses to allow me to try to passively open her mouth.  At that time, my plan was: Very difficult case. I am limited in my ability to help her based on my limited knowledge of her past medical history and my previous experience with the patient. My gut instinct is that this is psychosomatic. Her neurologist had recommended Cymbalta but the patient never tried the medication. She has never tried the Flexeril that I gave her last time to try to relax her muscles. She did try the prednisone it was completely ineffective per her mother's report. Prior to contacting psychiatry, I will arrange an ENT consultation for laryngoscopy given her odynophagia and dysphagia and pain in her neck. Hopefully they can perform this through her nostril fiberoptically and she refuses any examination that I performed. I'll also perform a CT scan of the neck to rule out any physical lesion that could possibly be causing her pain. I will also try her empirically on Tegretol 100 mg by mouth twice a day for possible trigeminal neuralgia  as this is the only other physical abnormality I could think that could cause the type of exquisite pain the patient is having around her face. However as I explained to the patient and her family, if the ENT consultation reveals no abnormality, if the CT scan reveals no abnormality, if the Tegretol provides no relief, I really think we need to start looking at this is psychosomatic complaints and possibly consult psychiatry. This is my gut instinct based on the patient's affect and demeanor  07/31/16 Shortly after our last visit, the patient started to improve. The muscle weakness and muscle soreness gradually resolved. Even the patient admitted that she felt it was due to her anxiety. We've had several visits since that time the patient has been very polite and pleasant. However she presents today with her mother and states that her symptoms are starting to return. She complains of muscle  tightness and soreness and stiffness in her masseter muscles bilaterally in her shoulders and in her neck. She also complains of abdominal pain and abdominal cramping. Independently, she saw a neurologist at [facility] who again performed EMG and nerve conduction studies which came back normal according to the patient. I have no written records from [facility]. They have her scheduled for an MRI of the brain. I spent approximately 30 minutes with the patient today answering her questions.  At that time, my plan was:  12/04/16 Symptoms are starting to reoccur. She reports muscle tension radiating from the lower aspect of her neck up her occiput upper scalp to her forehead. This occurs on a daily basis. She also reports muscle tension and muscle tightness in her jaw and in the lateral sides of her neck. She is extremely anxious about the symptoms returning as severe as they were previously. She also wants recheck her potassium. She was recheck her vitamin D. She was to be screened for HIV. Past Medical History:  Diagnosis Date  . Anxiety   . Gastritis   . GERD (gastroesophageal reflux disease)   . Muscle weakness   . Seasonal allergies    Past Surgical History:  Procedure Laterality Date  . NO PAST SURGERIES     Current Outpatient Prescriptions on File Prior to Visit  Medication Sig Dispense Refill  . esomeprazole (NEXIUM) 40 MG capsule Take 40 mg by mouth daily.  1  . MONO-LINYAH 0.25-35 MG-MCG tablet     . [DISCONTINUED] hyoscyamine (LEVSIN SL) 0.125 MG SL tablet Place 1 tablet (0.125 mg total) under the tongue every 8 (eight) hours as needed for cramping. (Patient not taking: Reported on 01/04/2016) 60 tablet 2  . [DISCONTINUED] omeprazole (PRILOSEC) 20 MG capsule Take 1 capsule (20 mg total) by mouth 2 (two) times daily before a meal. (Patient not taking: Reported on 01/04/2016) 30 capsule 0  . [DISCONTINUED] ranitidine (ZANTAC) 150 MG tablet Take 1 tablet (150 mg total) by mouth 2 (two) times daily.  (Patient not taking: Reported on 01/04/2016) 60 tablet 2  . [DISCONTINUED] sucralfate (CARAFATE) 1 g tablet Take 1 tablet (1 g total) by mouth 4 (four) times daily -  with meals and at bedtime. (Patient not taking: Reported on 01/04/2016) 30 tablet 0   No current facility-administered medications on file prior to visit.    Allergies  Allergen Reactions  . Sulfa Antibiotics Shortness Of Breath and Other (See Comments)    tremors  . Tramadol Other (See Comments)    headaches   Social History   Social History  . Marital status: Single  Spouse name: N/A  . Number of children: 0  . Years of education: 57   Occupational History  . Student    Social History Main Topics  . Smoking status: Never Smoker  . Smokeless tobacco: Never Used  . Alcohol use No  . Drug use: No  . Sexual activity: Yes    Partners: Male   Other Topics Concern  . Not on file   Social History Narrative   Lives at home with mother and father.   Right-handed.   No caffeine use.   Family History  Problem Relation Age of Onset  . Clotting disorder Maternal Grandfather   . Diabetes Maternal Grandfather   . Heart disease Maternal Grandfather   . Breast cancer Paternal Grandmother   . Diabetes Mother   . Other Mother     gallstones  . Hypertension Mother   . Heart disease Mother   . Hyperlipidemia Father   . Other Sister     pre-diabetes  . Other Sister     gallstones      Review of Systems  HENT: Positive for mouth sores.   All other systems reviewed and are negative.      Objective:   Physical Exam  Constitutional: She is oriented to person, place, and time. She appears well-developed and well-nourished. No distress.  HENT:  Right Ear: External ear normal.  Left Ear: External ear normal.  Nose: Nose normal.  Eyes: Conjunctivae are normal. Right eye exhibits no discharge. Left eye exhibits no discharge. No scleral icterus.  Neck: Neck supple. No JVD present. No tracheal deviation present.  No thyromegaly present.  Cardiovascular: Regular rhythm.  Tachycardia present.   Pulmonary/Chest: Effort normal and breath sounds normal. No respiratory distress. She has no wheezes. She has no rales. She exhibits no tenderness.  Abdominal: Soft. Bowel sounds are normal. She exhibits no distension and no mass. There is no tenderness. There is no rebound and no guarding.  Lymphadenopathy:    She has no cervical adenopathy.  Neurological: She is alert and oriented to person, place, and time. She has normal reflexes. No cranial nerve deficit. She exhibits normal muscle tone. Coordination normal.  Skin: She is not diaphoretic.  Psychiatric: Cognition and memory are normal.  Vitals reviewed.        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  Begin Valium 5-10 mg by mouth every 12 hours when necessary muscle spasms and muscle tension. Consult psychologist for counseling. Patient needs to learn cognitive behavioral techniques to help manage her anxiety. I will check a vitamin D level and HIV screen and a potassium level in accordance with the patient's wishes. The remainder of her exam is normal

## 2016-12-05 LAB — CBC WITH DIFFERENTIAL/PLATELET
BASOS PCT: 0 %
Basophils Absolute: 0 cells/uL (ref 0–200)
EOS ABS: 43 {cells}/uL (ref 15–500)
Eosinophils Relative: 1 %
HEMATOCRIT: 41.1 % (ref 35.0–45.0)
Hemoglobin: 13.7 g/dL (ref 12.0–15.0)
LYMPHS PCT: 42 %
Lymphs Abs: 1806 cells/uL (ref 850–3900)
MCH: 27.6 pg (ref 27.0–33.0)
MCHC: 33.3 g/dL (ref 32.0–36.0)
MCV: 82.7 fL (ref 80.0–100.0)
MONO ABS: 172 {cells}/uL — AB (ref 200–950)
MPV: 9.9 fL (ref 7.5–12.5)
Monocytes Relative: 4 %
Neutro Abs: 2279 cells/uL (ref 1500–7800)
Neutrophils Relative %: 53 %
Platelets: 335 10*3/uL (ref 140–400)
RBC: 4.97 MIL/uL (ref 3.80–5.10)
RDW: 14.4 % (ref 11.0–15.0)
WBC: 4.3 10*3/uL (ref 3.8–10.8)

## 2016-12-05 LAB — VITAMIN D 25 HYDROXY (VIT D DEFICIENCY, FRACTURES): Vit D, 25-Hydroxy: 35 ng/mL (ref 30–100)

## 2016-12-05 LAB — BASIC METABOLIC PANEL WITH GFR
BUN: 6 mg/dL — AB (ref 7–25)
CHLORIDE: 106 mmol/L (ref 98–110)
CO2: 21 mmol/L (ref 20–31)
Calcium: 9.5 mg/dL (ref 8.6–10.2)
Creat: 0.73 mg/dL (ref 0.50–1.10)
GFR, Est African American: >89 mL/min (ref >=60)
GFR, Est Non African American: >89 mL/min (ref >=60)
GLUCOSE: 84 mg/dL (ref 70–99)
POTASSIUM: 4.2 mmol/L (ref 3.5–5.3)
Sodium: 140 mmol/L (ref 135–146)

## 2016-12-05 LAB — HIV ANTIBODY (ROUTINE TESTING W REFLEX): HIV 1&2 Ab, 4th Generation: NONREACTIVE

## 2016-12-06 ENCOUNTER — Encounter: Payer: Self-pay | Admitting: Family Medicine

## 2016-12-12 ENCOUNTER — Ambulatory Visit: Payer: Commercial Managed Care - HMO | Admitting: Advanced Practice Midwife

## 2016-12-18 ENCOUNTER — Ambulatory Visit: Payer: Commercial Managed Care - HMO | Admitting: Advanced Practice Midwife

## 2016-12-22 ENCOUNTER — Encounter: Payer: Self-pay | Admitting: Family Medicine

## 2016-12-26 ENCOUNTER — Ambulatory Visit: Payer: Commercial Managed Care - HMO | Admitting: Advanced Practice Midwife

## 2017-01-01 ENCOUNTER — Ambulatory Visit: Payer: Commercial Managed Care - HMO | Admitting: Advanced Practice Midwife

## 2017-02-20 DIAGNOSIS — M791 Myalgia: Secondary | ICD-10-CM | POA: Diagnosis not present

## 2017-02-26 ENCOUNTER — Other Ambulatory Visit: Payer: Self-pay | Admitting: Family Medicine

## 2017-03-28 ENCOUNTER — Other Ambulatory Visit: Payer: Self-pay | Admitting: Family Medicine

## 2017-03-28 NOTE — Telephone Encounter (Signed)
No I would stop it and check vit d level.

## 2017-03-28 NOTE — Telephone Encounter (Signed)
Does she still need to be taking this much Vit D?

## 2017-04-02 ENCOUNTER — Telehealth (HOSPITAL_COMMUNITY): Payer: Self-pay | Admitting: *Deleted

## 2017-04-02 NOTE — Telephone Encounter (Signed)
left voice message regarding appointment. 

## 2017-04-02 NOTE — Telephone Encounter (Signed)
Left voice message regarding an appointment. 

## 2017-04-04 ENCOUNTER — Other Ambulatory Visit: Payer: Self-pay | Admitting: Family Medicine

## 2017-04-10 IMAGING — NM NM HEPATO W/GB/PHARM/[PERSON_NAME]
2 series · 12 of 12 positions shown · non-contrast
Comparison: None.

CLINICAL DATA: Gastritis

EXAM:
NUCLEAR MEDICINE HEPATOBILIARY IMAGING WITH GALLBLADDER EF
TECHNIQUE: Sequential images of the abdomen were obtained [DATE] minutes
following intravenous administration of radiopharmaceutical. After
oral ingestion of 8 ounces of Half-and-Half cream, gallbladder
ejection fraction was determined.
RADIOPHARMACEUTICALS:  5.1 mCi 3c-EEm Choletec IV

[anterior · 3.43mm/px · 6 of 60 frames shown]
[frame 6/60]
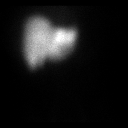
[frame 16/60]
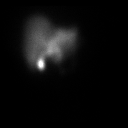
[frame 26/60]
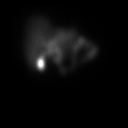
[frame 36/60]
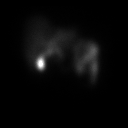
[frame 46/60]
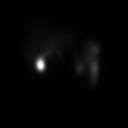
[frame 56/60]
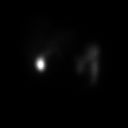

[gb ef · 3.43mm/px · 6 of 60 frames shown]
[frame 6/60]
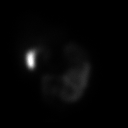
[frame 16/60]
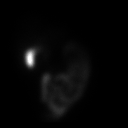
[frame 26/60]
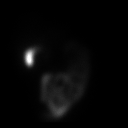
[frame 36/60]
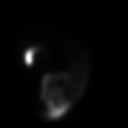
[frame 46/60]
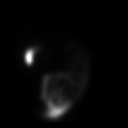
[frame 56/60]
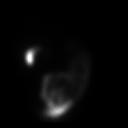

[12 of 12 positions shown; findings below may reference images not displayed]

FINDINGS: Gallbladder activity is noted after 10 minutes. Small bowel activity
is noted after 17 minutes. Gallbladder ejection fraction is 46%
after 1 hour. Normal is greater than 33%.
IMPRESSION: Cystic and common bile ducts are patent. Gallbladder ejection
fraction is within normal limits.

Gastritis Select proper NMHepatobiliary template.

## 2017-05-30 ENCOUNTER — Other Ambulatory Visit: Payer: Commercial Managed Care - HMO | Admitting: Advanced Practice Midwife

## 2017-06-12 ENCOUNTER — Other Ambulatory Visit: Payer: Commercial Managed Care - HMO | Admitting: Advanced Practice Midwife

## 2017-07-11 ENCOUNTER — Other Ambulatory Visit: Payer: Commercial Managed Care - HMO | Admitting: Advanced Practice Midwife

## 2017-07-17 ENCOUNTER — Other Ambulatory Visit: Payer: Commercial Managed Care - HMO | Admitting: Advanced Practice Midwife

## 2017-07-25 ENCOUNTER — Other Ambulatory Visit: Payer: Commercial Managed Care - HMO | Admitting: Advanced Practice Midwife

## 2017-08-04 ENCOUNTER — Other Ambulatory Visit: Payer: Self-pay | Admitting: Advanced Practice Midwife

## 2017-10-28 ENCOUNTER — Ambulatory Visit: Payer: Commercial Managed Care - HMO | Admitting: Family Medicine

## 2017-10-29 ENCOUNTER — Encounter: Payer: Self-pay | Admitting: Family Medicine

## 2017-11-04 ENCOUNTER — Ambulatory Visit: Payer: Commercial Managed Care - HMO | Admitting: Family Medicine

## 2017-11-07 ENCOUNTER — Ambulatory Visit (INDEPENDENT_AMBULATORY_CARE_PROVIDER_SITE_OTHER): Payer: 59 | Admitting: Family Medicine

## 2017-11-07 ENCOUNTER — Encounter: Payer: Self-pay | Admitting: Family Medicine

## 2017-11-07 VITALS — BP 130/74 | HR 98 | Temp 98.7°F | Resp 18 | Ht 65.0 in | Wt 250.0 lb

## 2017-11-07 DIAGNOSIS — F450 Somatization disorder: Secondary | ICD-10-CM | POA: Diagnosis not present

## 2017-11-07 DIAGNOSIS — M791 Myalgia, unspecified site: Secondary | ICD-10-CM | POA: Diagnosis not present

## 2017-11-07 DIAGNOSIS — M6281 Muscle weakness (generalized): Secondary | ICD-10-CM | POA: Diagnosis not present

## 2017-11-07 DIAGNOSIS — Z8639 Personal history of other endocrine, nutritional and metabolic disease: Secondary | ICD-10-CM

## 2017-11-07 DIAGNOSIS — F411 Generalized anxiety disorder: Secondary | ICD-10-CM | POA: Diagnosis not present

## 2017-11-07 DIAGNOSIS — E559 Vitamin D deficiency, unspecified: Secondary | ICD-10-CM | POA: Diagnosis not present

## 2017-11-07 NOTE — Progress Notes (Signed)
Subjective:    Patient ID: Bethany Barrera, female    DOB: 04/11/1996, 22 y.o.   MRN: [id number]  Mouth Lesions   Associated symptoms include mouth sores.  02/27/16 Patient is a very pleasant 22 year old African-American female who presents today to establish care. She has a very complicated chief complaint. Symptoms began approximately 3 months ago. Over the last 3 months she reports losing approximately 60 pounds. This is due to the fact that she is unable to eat. Subjectively she is unable to open her jaw due to pain in the masseter muscles. She also reports diffuse muscle weakness in her arms and in her legs. She reports numbness and tingling in her scalp and in her arms and in her legs. It is not waxing and waning but constant. Her previous PCP and her neurologist have been unable to isolate the cause of her symptoms as of yet. I reviewed the lab work and workup to date including an MRI of the brain that was normal, Lyme studies that were normal, TSH was normal, sedimentation rate slightly high. CRP was slightly high. EMG and nerve conduction studies reveal no evidence of neuropathy or myopathy. She has an appointment to see a rheumatologist on Wednesday. She went to the emergency room recently and was diagnosed with atrial tachycardia presumed secondary to dehydration. They debated starting her on prednisone but the patient denies taking prednisone at the present time. Lab workup in the emergency room was significant for a low potassium. Workup as an outpatient is also significant for low vitamin D.  At that time, my plan was: MRI of the brain and lab workup to date has been normal. At this point I will evaluate for other laboratory causes of diffuse muscle weakness including checking a copper level given her significant weight loss she may have copper deficiency, I will check a vitamin B12, I'll check a phosphate as well as a magnesium level. I will check a sedimentation rate and a CK to see if  these are persistent elevations or spurious one-time elevations. I will check a CK and aldolase to evaluate for any evidence of inflammatory myositis. If lab work suggests inflammatory myositis, I would recommend a muscle biopsy under the care of a rheumatologist to determine if she has an inflammatory myopathy. If lab workup is normal, I would recommend a MRI of the C-spine, T-spine, and L-spine to evaluate for any evidence of spinal cord abnormalities or white matter disease. She has had lab workup for myasthenia gravis which was normal. I would even consider an empiric trial of pyridostigmine.  No visits with results within 2 Week(s) from this visit.  Latest known visit with results is:  Office Visit on 12/04/2016  Component Date Value Ref Range Status  . WBC 12/04/2016 4.3  3.8 - 10.8 K/uL Final  . RBC 12/04/2016 4.97  3.80 - 5.10 MIL/uL Final  . Hemoglobin 12/04/2016 13.7  12.0 - 15.0 g/dL Final  . HCT 12/04/2016 41.1  35.0 - 45.0 % Final  . MCV 12/04/2016 82.7  80.0 - 100.0 fL Final  . MCH 12/04/2016 27.6  27.0 - 33.0 pg Final  . MCHC 12/04/2016 33.3  32.0 - 36.0 g/dL Final  . RDW 12/04/2016 14.4  11.0 - 15.0 % Final  . Platelets 12/04/2016 335  140 - 400 K/uL Final  . MPV 12/04/2016 9.9  7.5 - 12.5 fL Final  . Neutro Abs 12/04/2016 2,279  1,500 - 7,800 cells/uL Final  . Lymphs Abs 12/04/2016 1,806  850 - 3,900 cells/uL Final  . Monocytes Absolute 12/04/2016 172* 200 - 950 cells/uL Final  . Eosinophils Absolute 12/04/2016 43  15 - 500 cells/uL Final  . Basophils Absolute 12/04/2016 0  0 - 200 cells/uL Final  . Neutrophils Relative % 12/04/2016 53  % Final  . Lymphocytes Relative 12/04/2016 42  % Final  . Monocytes Relative 12/04/2016 4  % Final  . Eosinophils Relative 12/04/2016 1  % Final  . Basophils Relative 12/04/2016 0  % Final  . Smear Review 12/04/2016 Criteria for review not met   Final  . Sodium 12/04/2016 140  135 - 146 mmol/L Final  . Potassium 12/04/2016 4.2  3.5 - 5.3  mmol/L Final  . Chloride 12/04/2016 106  98 - 110 mmol/L Final  . CO2 12/04/2016 21  20 - 31 mmol/L Final  . Glucose, Bld 12/04/2016 84  70 - 99 mg/dL Final  . BUN 12/04/2016 6* 7 - 25 mg/dL Final  . Creat 12/04/2016 0.73  0.50 - 1.10 mg/dL Final  . Calcium 12/04/2016 9.5  8.6 - 10.2 mg/dL Final  . GFR, Est African American 12/04/2016 >89  >=60 mL/min Final  . GFR, Est Non African American 12/04/2016 >89  >=60 mL/min Final  . Vit D, 25-Hydroxy 12/04/2016 35  30 - 100 ng/mL Final   Comment: Vitamin D Status           25-OH Vitamin D        Deficiency                <20 ng/mL        Insufficiency         20 - 29 ng/mL        Optimal             > or = 30 ng/mL   For 25-OH Vitamin D testing on patients on D2-supplementation and patients for whom quantitation of D2 and D3 fractions is required, the QuestAssureD 25-OH VIT D, (D2,D3), LC/MS/MS is recommended: order code [id number] (patients > 2 yrs).   Marland Kitchen HIV 1&2 Ab, 4th Generation 12/04/2016 NONREACTIVE  NONREACTIVE Final   Comment:   HIV-1 antigen and HIV-1/HIV-2 antibodies were not detected.  There is no laboratory evidence of HIV infection.   HIV-1/2 Antibody Diff        Not indicated. HIV-1 RNA, Qual TMA          Not indicated.     PLEASE NOTE: This information has been disclosed to you from records whose confidentiality may be protected by state law. If your state requires such protection, then the state law prohibits you from making any further disclosure of the information without the specific written consent of the person to whom it pertains, or as otherwise permitted by law. A general authorization for the release of medical or other information is NOT sufficient for this purpose.   The performance of this assay has not been clinically validated in patients less than 73 years old.   For additional information please refer to http://education.questdiagnostics.com/faq/FAQ106.  (This link is being provided for  informational/educational purposes only.)      03/08/16 Patient saw the rheumatologist. I have not received his communication back yet but per the patient's report, the rheumatologist did not feel this is an autoimmune process. She continues to report diffuse muscle weakness and now diffuse muscle pains he continues to have neuropathic paresthesias all over her body. She continues to be unable to open her jaw/mouth due  to severe pain in her TMJ area and muscle tightness which is subjective in her masseter muscles. She continues to demonstrate a very flat affect. She is here today with her mother who does the majority of the talking. Patient will barely respond. She has taken to writing her answers down on a notepad rather than speak because of the pain in her TMJ joints.  She continues to lose weight due to her inability to open her mouth and she also reports trouble swallowing with pain in her upper throat. At that time, my plan was: So far the only abnormality I can find is a slightly elevated sedimentation rate. I suggested trying a course of prednisone to see if her muscle weakness and muscle pains would improve suggesting an autoimmune process and possibly getting a second opinion with a different rheumatologist. The patient refuses to take prednisone. Therefore I will start her on diclofenac 75 mg by mouth twice a day in addition to Flexeril 10 mg every 8 hours as needed for muscle spasms. I will arrange a second opinion with a neurologist at [facility]. I will also consult ENT given her severe TMJ pain and her sore throat. My clinical impression is that the patient may have psychosomatic complaints however I want to exhaust all possibilities prior to treating her for this.  03/16/16 Since the last time I saw the patient, she has tried a prednisone taper pack with no improvement in her symptoms. She is here today with her mother. She refuses to talk. She refuses to open her mouth. She refuses to even  allow me to touch the TMJ joints bilaterally. She has a flat affect and is noncommunicative. Her demeanor is extremely abnormal. Even with maximal coaching and urging the patient will barely open her mouth and speak but she continues to insist on writing everything down or not answering my questions at all. Her primary complaint at the present time is severe pain in both TMJ joints that radiates onto her forehead bilaterally down her jaw bilaterally towards her mouth. She also complains of pain and pressure radiating up her scalp into the parietal lobe bilaterally. However most of his communication is limited to hand signals and her mother's interpretation of cryptic notes. Continues to lose weight. She is yet to see the neurologist at [facility]. She has yet to see the ENT doctor. Again she refuses to allow me to perform even a rudimentary exam of the oral cavity due to the "pain" she just shakes her head no when I asked her to open her mouth and refuses to allow me to try to passively open her mouth.  At that time, my plan was: Very difficult case. I am limited in my ability to help her based on my limited knowledge of her past medical history and my previous experience with the patient. My gut instinct is that this is psychosomatic. Her neurologist had recommended Cymbalta but the patient never tried the medication. She has never tried the Flexeril that I gave her last time to try to relax her muscles. She did try the prednisone it was completely ineffective per her mother's report. Prior to contacting psychiatry, I will arrange an ENT consultation for laryngoscopy given her odynophagia and dysphagia and pain in her neck. Hopefully they can perform this through her nostril fiberoptically and she refuses any examination that I performed. I'll also perform a CT scan of the neck to rule out any physical lesion that could possibly be causing her pain. I  will also try her empirically on Tegretol 100 mg by mouth twice a day  for possible trigeminal neuralgia as this is the only other physical abnormality I could think that could cause the type of exquisite pain the patient is having around her face. However as I explained to the patient and her family, if the ENT consultation reveals no abnormality, if the CT scan reveals no abnormality, if the Tegretol provides no relief, I really think we need to start looking at this is psychosomatic complaints and possibly consult psychiatry. This is my gut instinct based on the patient's affect and demeanor  07/31/16 Shortly after our last visit, the patient started to improve. The muscle weakness and muscle soreness gradually resolved. Even the patient admitted that she felt it was due to her anxiety. We've had several visits since that time the patient has been very polite and pleasant. However she presents today with her mother and states that her symptoms are starting to return. She complains of muscle tightness and soreness and stiffness in her masseter muscles bilaterally in her shoulders and in her neck. She also complains of abdominal pain and abdominal cramping. Independently, she saw a neurologist at [facility] who again performed EMG and nerve conduction studies which came back normal according to the patient. I have no written records from [facility]. They have her scheduled for an MRI of the brain. I spent approximately 30 minutes with the patient today answering her questions.  At that time, my plan was:  12/04/16 Symptoms are starting to reoccur. She reports muscle tension radiating from the lower aspect of her neck up her occiput upper scalp to her forehead. This occurs on a daily basis. She also reports muscle tension and muscle tightness in her jaw and in the lateral sides of her neck. She is extremely anxious about the symptoms returning as severe as they were previously. She also wants recheck her potassium. She was recheck her vitamin D. She was to be screened for HIV.  At that  time, my plan was: I believe the patient has somatization disorder and panic attacks.  Begin Valium 5-10 mg by mouth every 12 hours when necessary muscle spasms and muscle tension. Consult psychologist for counseling. Patient needs to learn cognitive behavioral techniques to help manage her anxiety. I will check a vitamin D level and HIV screen and a potassium level in accordance with the patient's wishes. The remainder of her exam is normal  11/07/17 Patient is a 22 year old who presents again today complaining of diffuse muscle pain.  I asked her to be more specific and upon further questioning she states that all the muscles in her body cause her pain even down to the small muscles in her hand.  She complains of pain in her shoulders and her head and her neck and her back and her abdomen.  However she admits that she does believe anxiety is likely a culprit.  Which she becomes anxious, she admits that she can feel the muscles drawing into a tight spasm.  In the past she has seen benefit with Valium.  I am concerned by her weight gain since the last time I saw her.  She states that she is not eating excessively.  She is relatively sedentary due to the muscle pain.  She did see a psychologist but she saw a little benefit and what they recommended.  She did not see benefit from the cognitive behavioral strategies that they try to teach her.  She is here  today to discuss options to try to help manage the muscle pain as well as the anxiety. Past Medical History:  Diagnosis Date  . Anxiety   . Gastritis   . GERD (gastroesophageal reflux disease)   . Muscle weakness   . Seasonal allergies    Past Surgical History:  Procedure Laterality Date  . NO PAST SURGERIES     Current Outpatient Medications on File Prior to Visit  Medication Sig Dispense Refill  . esomeprazole (NEXIUM) 40 MG capsule take 1 capsule by mouth once daily 20 MINUTES BEFORE BREAKFAST 30 capsule 12  . Vitamin D, Ergocalciferol,  (DRISDOL) 50000 units CAPS capsule     . [DISCONTINUED] hyoscyamine (LEVSIN SL) 0.125 MG SL tablet Place 1 tablet (0.125 mg total) under the tongue every 8 (eight) hours as needed for cramping. (Patient not taking: Reported on 01/04/2016) 60 tablet 2  . [DISCONTINUED] omeprazole (PRILOSEC) 20 MG capsule Take 1 capsule (20 mg total) by mouth 2 (two) times daily before a meal. (Patient not taking: Reported on 01/04/2016) 30 capsule 0  . [DISCONTINUED] ranitidine (ZANTAC) 150 MG tablet Take 1 tablet (150 mg total) by mouth 2 (two) times daily. (Patient not taking: Reported on 01/04/2016) 60 tablet 2  . [DISCONTINUED] sucralfate (CARAFATE) 1 g tablet Take 1 tablet (1 g total) by mouth 4 (four) times daily -  with meals and at bedtime. (Patient not taking: Reported on 01/04/2016) 30 tablet 0   No current facility-administered medications on file prior to visit.    Allergies  Allergen Reactions  . Sulfa Antibiotics Shortness Of Breath and Other (See Comments)    tremors  . Tramadol Other (See Comments)    headaches   Social History   Socioeconomic History  . Marital status: Single    Spouse name: Not on file  . Number of children: 0  . Years of education: 74  . Highest education level: Not on file  Social Needs  . Financial resource strain: Not on file  . Food insecurity - worry: Not on file  . Food insecurity - inability: Not on file  . Transportation needs - medical: Not on file  . Transportation needs - non-medical: Not on file  Occupational History  . Occupation: Ship broker  Tobacco Use  . Smoking status: Never Smoker  . Smokeless tobacco: Never Used  Substance and Sexual Activity  . Alcohol use: No  . Drug use: No  . Sexual activity: Yes    Partners: Male  Other Topics Concern  . Not on file  Social History Narrative   Lives at home with mother and father.   Right-handed.   No caffeine use.   Family History  Problem Relation Age of Onset  . Clotting disorder Maternal  Grandfather   . Diabetes Maternal Grandfather   . Heart disease Maternal Grandfather   . Breast cancer Paternal Grandmother   . Diabetes Mother   . Other Mother        gallstones  . Hypertension Mother   . Heart disease Mother   . Hyperlipidemia Father   . Other Sister        pre-diabetes  . Other Sister        gallstones      Review of Systems  HENT: Positive for mouth sores.   All other systems reviewed and are negative.      Objective:   Physical Exam  Constitutional: She is oriented to person, place, and time. She appears well-developed and well-nourished. No distress.  HENT:  Right Ear: External ear normal.  Left Ear: External ear normal.  Nose: Nose normal.  Eyes: Conjunctivae are normal. Right eye exhibits no discharge. Left eye exhibits no discharge. No scleral icterus.  Neck: Neck supple. No JVD present. No tracheal deviation present. No thyromegaly present.  Cardiovascular: Normal rate and regular rhythm.  Pulmonary/Chest: Effort normal and breath sounds normal. No respiratory distress. She has no wheezes. She has no rales. She exhibits no tenderness.  Abdominal: Soft. Bowel sounds are normal. She exhibits no distension and no mass. There is no tenderness. There is no rebound and no guarding.  Lymphadenopathy:    She has no cervical adenopathy.  Neurological: She is alert and oriented to person, place, and time. She has normal reflexes. No cranial nerve deficit. She exhibits normal muscle tone. Coordination normal.  Skin: She is not diaphoretic.  Psychiatric: Cognition and memory are normal.  Vitals reviewed.        Assessment & Plan:  Myalgia - Plan: CBC with Differential/Platelet, COMPLETE METABOLIC PANEL WITH GFR, TSH, Sedimentation rate, VITAMIN D 25 Hydroxy (Vit-D Deficiency, Fractures)  Muscle weakness  Somatization disorder  Vitamin D deficiency  GAD (generalized anxiety disorder)  History of low potassium   Given the change in her weight,  I do believe lab work is in order.  I would like to check her fasting blood sugar to rule out diabetes.  She does have a history of low potassium so would also like to check her electrolytes.  She also carries a history of vitamin D deficiency and I believe we should check her vitamin D as well.  If her labs are normal, I would treat the patient as generalized anxiety disorder, somatization disorder, fibromyalgia syndrome.  I believe Cymbalta 60 mg a day would help manage all of these issues simultaneously.  I explained to the patient my rationale in the medication.  I believe it can help control anxiety as well as help muscle pain.  Furthermore it is not habit-forming.  I believe this will be the best long-term option for a medication perspective.  It is my personal perspective that I believe the majority of this is an anxiety and identified to prevent and control the anxiety the muscle pain will improve as well.  Patient is in agreement to check the lab work.  She seems hesitant to start the medication.  Will await the results of lab work and then move forward from that point on.

## 2017-11-08 ENCOUNTER — Other Ambulatory Visit: Payer: Self-pay | Admitting: Family Medicine

## 2017-11-08 ENCOUNTER — Encounter: Payer: Self-pay | Admitting: Family Medicine

## 2017-11-08 DIAGNOSIS — R700 Elevated erythrocyte sedimentation rate: Secondary | ICD-10-CM

## 2017-11-08 DIAGNOSIS — M791 Myalgia, unspecified site: Secondary | ICD-10-CM

## 2017-11-08 LAB — CBC WITH DIFFERENTIAL/PLATELET
BASOS ABS: 20 {cells}/uL (ref 0–200)
Basophils Relative: 0.3 %
Eosinophils Absolute: 88 cells/uL (ref 15–500)
Eosinophils Relative: 1.3 %
HCT: 35.4 % (ref 35.0–45.0)
Hemoglobin: 12.1 g/dL (ref 11.7–15.5)
Lymphs Abs: 1482 cells/uL (ref 850–3900)
MCH: 26.5 pg — ABNORMAL LOW (ref 27.0–33.0)
MCHC: 34.2 g/dL (ref 32.0–36.0)
MCV: 77.6 fL — AB (ref 80.0–100.0)
MPV: 10.4 fL (ref 7.5–12.5)
Monocytes Relative: 5.5 %
NEUTROS PCT: 71.1 %
Neutro Abs: 4835 cells/uL (ref 1500–7800)
Platelets: 329 10*3/uL (ref 140–400)
RBC: 4.56 10*6/uL (ref 3.80–5.10)
RDW: 13.7 % (ref 11.0–15.0)
TOTAL LYMPHOCYTE: 21.8 %
WBC: 6.8 10*3/uL (ref 3.8–10.8)
WBCMIX: 374 {cells}/uL (ref 200–950)

## 2017-11-08 LAB — COMPLETE METABOLIC PANEL WITH GFR
AG RATIO: 1.4 (calc) (ref 1.0–2.5)
ALKALINE PHOSPHATASE (APISO): 87 U/L (ref 33–115)
ALT: 14 U/L (ref 6–29)
AST: 16 U/L (ref 10–30)
Albumin: 4 g/dL (ref 3.6–5.1)
BILIRUBIN TOTAL: 0.3 mg/dL (ref 0.2–1.2)
BUN: 9 mg/dL (ref 7–25)
CHLORIDE: 105 mmol/L (ref 98–110)
CO2: 21 mmol/L (ref 20–32)
Calcium: 8.9 mg/dL (ref 8.6–10.2)
Creat: 0.76 mg/dL (ref 0.50–1.10)
GFR, EST NON AFRICAN AMERICAN: 111 mL/min/{1.73_m2} (ref >=60)
GFR, Est African American: 128 mL/min/{1.73_m2} (ref >=60)
GLOBULIN: 2.9 g/dL (ref 1.9–3.7)
Glucose, Bld: 96 mg/dL (ref 65–99)
POTASSIUM: 4 mmol/L (ref 3.5–5.3)
SODIUM: 137 mmol/L (ref 135–146)
Total Protein: 6.9 g/dL (ref 6.1–8.1)

## 2017-11-08 LAB — VITAMIN D 25 HYDROXY (VIT D DEFICIENCY, FRACTURES): Vit D, 25-Hydroxy: 17 ng/mL — ABNORMAL LOW (ref 30–100)

## 2017-11-08 LAB — SEDIMENTATION RATE: Sed Rate: 60 mm/h — ABNORMAL HIGH (ref 0–20)

## 2017-11-08 LAB — TSH: TSH: 3.47 m[IU]/L

## 2017-11-14 ENCOUNTER — Other Ambulatory Visit: Payer: Self-pay | Admitting: Family Medicine

## 2017-11-14 MED ORDER — VITAMIN D (ERGOCALCIFEROL) 1.25 MG (50000 UNIT) PO CAPS: 50000.0000 [IU] | ORAL_CAPSULE | ORAL | 6 refills | Status: DC

## 2017-11-14 MED ORDER — ONDANSETRON HCL 4 MG PO TABS: 4.0000 mg | ORAL_TABLET | Freq: Three times a day (TID) | ORAL | 0 refills | Status: DC | PRN

## 2017-11-14 MED ORDER — DULOXETINE HCL 60 MG PO CPEP: 60.0000 mg | ORAL_CAPSULE | Freq: Every day | ORAL | 5 refills | Status: DC

## 2017-12-24 ENCOUNTER — Other Ambulatory Visit: Payer: 59

## 2017-12-24 DIAGNOSIS — R700 Elevated erythrocyte sedimentation rate: Secondary | ICD-10-CM

## 2017-12-24 DIAGNOSIS — M791 Myalgia, unspecified site: Secondary | ICD-10-CM | POA: Diagnosis not present

## 2017-12-26 ENCOUNTER — Encounter: Payer: Self-pay | Admitting: Family Medicine

## 2017-12-26 LAB — ANTI-NUCLEAR AB-TITER (ANA TITER): ANA Titer 1: 1:160 {titer} — ABNORMAL HIGH

## 2017-12-26 LAB — ANTI-DNA ANTIBODY, DOUBLE-STRANDED: DS DNA AB: 1 [IU]/mL

## 2017-12-26 LAB — ANTI-SMITH ANTIBODY: ENA SM Ab Ser-aCnc: <1 AI

## 2017-12-26 LAB — ANA: ANA: POSITIVE — AB

## 2017-12-26 LAB — C-REACTIVE PROTEIN: CRP: 39.4 mg/L — AB (ref <8.0)

## 2017-12-26 LAB — CK: Total CK: 44 U/L (ref 29–143)

## 2018-01-17 ENCOUNTER — Encounter: Payer: Self-pay | Admitting: Family Medicine

## 2018-01-17 DIAGNOSIS — R768 Other specified abnormal immunological findings in serum: Secondary | ICD-10-CM

## 2018-01-17 DIAGNOSIS — M6281 Muscle weakness (generalized): Secondary | ICD-10-CM

## 2018-01-17 DIAGNOSIS — M791 Myalgia, unspecified site: Secondary | ICD-10-CM

## 2018-01-31 DIAGNOSIS — M791 Myalgia, unspecified site: Secondary | ICD-10-CM | POA: Diagnosis not present

## 2018-02-11 NOTE — Progress Notes (Signed)
Office Visit Note  Patient: Bethany Barrera             Date of Birth: 06/30/1996           MRN: [id number]             PCP: Susy Frizzle, MD Referring: Susy Frizzle, MD Visit Date: 02/25/2018 Occupation: Unemployed    Subjective:  Muscle pain and muscle weakness.   History of Present Illness: Bethany Barrera is a 22 y.o. female seen in consultation per request of her PCP.  According to patient her symptoms started about 2 years ago with increased muscle pain and weakness.  She states the pain gradually got worse and she has been in a lot of pain.  She has seen several doctors in the past and had lab work which was inconclusive.  She states recently her sedimentation rate has been climbing and for that reason she was referred to [place].  She was also diagnosed with vitamin D deficiency and she has been taking vitamin D.  She has some discomfort in her knee joints.  None of the other joints are painful or swollen.    Activities of Daily Living:  Patient reports morning stiffness for 10 minutes.   Patient Reports nocturnal pain.  Difficulty dressing/grooming: Denies Difficulty climbing stairs: Denies Difficulty getting out of chair: Denies Difficulty using hands for taps, buttons, cutlery, and/or writing: Denies   Review of Systems  Constitutional: Positive for fatigue. Negative for night sweats, weight gain and weight loss.  HENT: Negative for mouth sores, trouble swallowing, trouble swallowing, mouth dryness and nose dryness.   Eyes: Negative for pain, redness, visual disturbance and dryness.  Respiratory: Negative for cough, shortness of breath and difficulty breathing.   Cardiovascular: Positive for palpitations. Negative for chest pain, hypertension, irregular heartbeat and swelling in legs/feet.  Gastrointestinal: Negative for blood in stool, constipation and diarrhea.  Endocrine: Negative for increased urination.  Genitourinary: Negative for vaginal dryness.    Musculoskeletal: Positive for myalgias, muscle weakness, morning stiffness and myalgias. Negative for arthralgias, joint pain, joint swelling and muscle tenderness.  Skin: Negative for color change, rash, hair loss, skin tightness, ulcers and sensitivity to sunlight.  Allergic/Immunologic: Negative for susceptible to infections.  Neurological: Negative for dizziness, memory loss, night sweats and weakness.  Hematological: Negative for swollen glands.  Psychiatric/Behavioral: Positive for sleep disturbance. Negative for depressed mood. The patient is nervous/anxious.     PMFS History:  Patient Active Problem List   Diagnosis Date Noted  . Vitamin D deficiency 02/25/2018  . Paroxysmal atrial tachycardia by electrocardiogram ([place]) 02/22/2016  . Weakness 01/11/2016  . Paresthesia 01/11/2016  . GERD (gastroesophageal reflux disease)     Past Medical History:  Diagnosis Date  . Anxiety   . Gastritis   . GERD (gastroesophageal reflux disease)   . Muscle weakness   . Seasonal allergies     Family History  Problem Relation Age of Onset  . Clotting disorder Maternal Grandfather   . Diabetes Maternal Grandfather   . Heart disease Maternal Grandfather   . Breast cancer Paternal Grandmother   . Diabetes Mother   . Other Mother        gallstones  . Hypertension Mother   . Heart disease Mother   . Hyperlipidemia Father   . Other Sister        pre-diabetes  . Other Sister        gallstones   Past Surgical History:  Procedure Laterality  Date  . NO PAST SURGERIES     Social History   Social History Narrative   Lives at home with mother and father.   Right-handed.   No caffeine use.     Objective: Vital Signs: BP 134/80 (BP Location: Left Arm, Patient Position: Sitting, Cuff Size: Large)   Pulse 77   Resp 16   Ht 5' 5.5" (1.664 m)   Wt 259 lb (117.5 kg)   BMI 42.44 kg/m    Physical Exam  Constitutional: She is oriented to person, place, and time. She appears  well-developed and well-nourished.  HENT:  Head: Normocephalic and atraumatic.  Eyes: Conjunctivae and EOM are normal.  Neck: Normal range of motion.  Cardiovascular: Normal rate, regular rhythm, normal heart sounds and intact distal pulses.  Pulmonary/Chest: Effort normal and breath sounds normal.  Abdominal: Soft. Bowel sounds are normal.  Lymphadenopathy:    She has no cervical adenopathy.  Neurological: She is alert and oriented to person, place, and time.  Skin: Skin is warm and dry. Capillary refill takes less than 2 seconds.  Psychiatric: She has a normal mood and affect. Her behavior is normal.  Nursing note and vitals reviewed.    Musculoskeletal Exam: C-spine good range of motion.  She has discomfort range of motion of her thoracic and lumbar spine.  No point tenderness was noted.  No SI joint tenderness was noted.  Shoulder joints elbow joints wrist joint MCPs PIPs DIPs were in good range of motion with no synovitis.  Hip joints knee joints ankles MTPs PIPs DIPs were in good range of motion with no synovitis.  CDAI Exam: No CDAI exam completed.    Investigation: Findings:  12/24/17: ANA 1:160 speckled, TSH 3.47, Sed rate 60, CRP 39.4, CK 44, smith -, dsDNA 1, Vitamin D 17  Component     Latest Ref Rng & Units 11/07/2017 12/24/2017  ANA Pattern 1       SPECKLED (A)  ANA Titer 1     titer  1:160 (H)  TSH     mIU/L 3.47   Sed Rate     0 - 20 mm/h 60 (H)   Vitamin D, 25-Hydroxy     30 - 100 ng/mL 17 (L)   CRP     <8.0 mg/L  39.4 (H)  CK Total     29 - 143 U/L  44  ENA SM Ab Ser-aCnc     <1.0 NEG AI  <1.0 NEG  ds DNA Ab     IU/mL  1  Anit Nuclear Antibody(ANA)     NEGATIVE  POSITIVE (A)   CBC Latest Ref Rng & Units 11/07/2017 12/04/2016 02/27/2016  WBC 3.8 - 10.8 Thousand/uL 6.8 4.3 5.7  Hemoglobin 11.7 - 15.5 g/dL 12.1 13.7 13.6  Hematocrit 35.0 - 45.0 % 35.4 41.1 40.3  Platelets 140 - 400 Thousand/uL 329 335 342   CMP Latest Ref Rng & Units 11/07/2017 12/04/2016  04/17/2016  Glucose 65 - 99 mg/dL 96 84 81  BUN 7 - 25 mg/dL 9 6(L) 4(L)  Creatinine 0.50 - 1.10 mg/dL 0.76 0.73 0.54  Sodium 135 - 146 mmol/L 137 140 142  Potassium 3.5 - 5.3 mmol/L 4.0 4.2 3.9  Chloride 98 - 110 mmol/L 105 106 102  CO2 20 - 32 mmol/L _0 Calcium 8.6 - 10.2 mg/dL 8.9 9.5 9.4  Total Protein 6.1 - 8.1 g/dL 6.9 - -  Total Bilirubin 0.2 - 1.2 mg/dL 0.3 - -  Alkaline  Phos 33 - 115 U/L - - -  AST 10 - 30 U/L 16 - -  ALT 6 - 29 U/L 14 - -    Imaging: Xr Thoracic Spine 2 View  Result Date: 02/25/2018 There is no disc space narrowing no syndesmophytes.  Unremarkable x-ray of the thoracic spine.  Xr Lumbar Spine 2-3 Views  Result Date: 02/25/2018 Lumbar spine x-ray was unremarkable without any disc space narrowing or spurring of syndesmophytes.  SI joints appeared normal.   Speciality Comments: No specialty comments available.    Procedures:  No procedures performed Allergies: Sulfa antibiotics and Tramadol   Assessment / Plan:     Visit Diagnoses: Positive ANA (antinuclear antibody) - 12/24/17: ANA 1:160 speckled, Sed rate 60, CRP 39.4, smith -, dsDNA 1, patient has no clinical features of systemic lupus.  Myalgia-she gives chronic history of myalgias that is going on for 2 years now.  She possibly have some myofascial pain syndrome.  Muscle weakness-there was no muscle weakness noted in any of the muscle groups.  DTRs were intact.  Elevated ESR-I will obtain following labs to evaluate further, which are listed as follows.  Pain in thoracic spine - Plan: XR Thoracic Spine 2 View.  The thoracic spine x-ray was unremarkable.  Chronic midline low back pain without sciatica - Plan: XR Lumbar Spine 2-3 Views.  The lumbar spine x-ray was unremarkable.  Handout on back exercises was given.  Pain knees: She complains of pain in bilateral knee joints.  No warmth swelling or effusion was noted.  I given her a handout on knee exercises.  History of gastroesophageal  reflux (GERD)  History of gastritis  Paroxysmal atrial tachycardia by electrocardiogram ([facility])  History of anxiety  Vitamin D deficiency -she has significant vitamin D deficiency which can contribute to myalgias.  She is on vitamin D treatment currently.  I would like to see how her muscle weakness improves and muscle pain improves after correcting her vitamin D levels.   Orders: Orders Placed This Encounter  Procedures  . XR Thoracic Spine 2 View  . XR Lumbar Spine 2-3 Views  . Urinalysis, Routine w reflex microscopic  . CK  . Sedimentation rate  . Aldolase  . Serum protein electrophoresis with reflex  . IgG, IgA, IgM  . Pan-ANCA  . Rheumatoid factor   No orders of the defined types were placed in this encounter.   Face-to-face time spent with patient was 50 minutes.> 50% of time was spent in counseling and coordination of care.  Follow-Up Instructions: Return for Myalgia, elevated ESR.   Bo Merino, MD  Note - This record has been created using Editor, commissioning.  Chart creation errors have been sought, but may not always  have been located. Such creation errors do not reflect on  the standard of medical care.

## 2018-02-25 ENCOUNTER — Ambulatory Visit (INDEPENDENT_AMBULATORY_CARE_PROVIDER_SITE_OTHER): Payer: 59

## 2018-02-25 ENCOUNTER — Ambulatory Visit: Payer: 59 | Admitting: Rheumatology

## 2018-02-25 ENCOUNTER — Ambulatory Visit (INDEPENDENT_AMBULATORY_CARE_PROVIDER_SITE_OTHER): Payer: Self-pay

## 2018-02-25 ENCOUNTER — Encounter: Payer: Self-pay | Admitting: Rheumatology

## 2018-02-25 VITALS — BP 134/80 | HR 77 | Resp 16 | Ht 65.5 in | Wt 259.0 lb

## 2018-02-25 DIAGNOSIS — G8929 Other chronic pain: Secondary | ICD-10-CM

## 2018-02-25 DIAGNOSIS — R768 Other specified abnormal immunological findings in serum: Secondary | ICD-10-CM

## 2018-02-25 DIAGNOSIS — Z8659 Personal history of other mental and behavioral disorders: Secondary | ICD-10-CM | POA: Diagnosis not present

## 2018-02-25 DIAGNOSIS — M545 Low back pain, unspecified: Secondary | ICD-10-CM

## 2018-02-25 DIAGNOSIS — M6281 Muscle weakness (generalized): Secondary | ICD-10-CM | POA: Diagnosis not present

## 2018-02-25 DIAGNOSIS — E559 Vitamin D deficiency, unspecified: Secondary | ICD-10-CM | POA: Insufficient documentation

## 2018-02-25 DIAGNOSIS — M546 Pain in thoracic spine: Secondary | ICD-10-CM

## 2018-02-25 DIAGNOSIS — I471 Supraventricular tachycardia: Secondary | ICD-10-CM

## 2018-02-25 DIAGNOSIS — M791 Myalgia, unspecified site: Secondary | ICD-10-CM | POA: Diagnosis not present

## 2018-02-25 DIAGNOSIS — M25562 Pain in left knee: Secondary | ICD-10-CM

## 2018-02-25 DIAGNOSIS — M25561 Pain in right knee: Secondary | ICD-10-CM | POA: Diagnosis not present

## 2018-02-25 DIAGNOSIS — Z8719 Personal history of other diseases of the digestive system: Secondary | ICD-10-CM

## 2018-02-25 NOTE — Patient Instructions (Signed)
Knee Exercises Ask your health care provider which exercises are safe for you. Do exercises exactly as told by your health care provider and adjust them as directed. It is normal to feel mild stretching, pulling, tightness, or discomfort as you do these exercises, but you should stop right away if you feel sudden pain or your pain gets worse.Do not begin these exercises until told by your health care provider. STRETCHING AND RANGE OF MOTION EXERCISES These exercises warm up your muscles and joints and improve the movement and flexibility of your knee. These exercises also help to relieve pain, numbness, and tingling. Exercise A: Knee Extension, Prone 1. Lie on your abdomen on a bed. 2. Place your left / right knee just beyond the edge of the surface so your knee is not on the bed. You can put a towel under your left / right thigh just above your knee for comfort. 3. Relax your leg muscles and allow gravity to straighten your knee. You should feel a stretch behind your left / right knee. 4. Hold this position for __________ seconds. 5. Scoot up so your knee is supported between repetitions. Repeat __________ times. Complete this stretch __________ times a day. Exercise B: Knee Flexion, Active  1. Lie on your back with both knees straight. If this causes back discomfort, bend your left / right knee so your foot is flat on the floor. 2. Slowly slide your left / right heel back toward your buttocks until you feel a gentle stretch in the front of your knee or thigh. 3. Hold this position for __________ seconds. 4. Slowly slide your left / right heel back to the starting position. Repeat __________ times. Complete this exercise __________ times a day. Exercise C: Quadriceps, Prone  1. Lie on your abdomen on a firm surface, such as a bed or padded floor. 2. Bend your left / right knee and hold your ankle. If you cannot reach your ankle or pant leg, loop a belt around your foot and grab the belt  instead. 3. Gently pull your heel toward your buttocks. Your knee should not slide out to the side. You should feel a stretch in the front of your thigh and knee. 4. Hold this position for __________ seconds. Repeat __________ times. Complete this stretch __________ times a day. Exercise D: Hamstring, Supine 1. Lie on your back. 2. Loop a belt or towel over the ball of your left / right foot. The ball of your foot is on the walking surface, right under your toes. 3. Straighten your left / right knee and slowly pull on the belt to raise your leg until you feel a gentle stretch behind your knee. ? Do not let your left / right knee bend while you do this. ? Keep your other leg flat on the floor. 4. Hold this position for __________ seconds. Repeat __________ times. Complete this stretch __________ times a day. STRENGTHENING EXERCISES These exercises build strength and endurance in your knee. Endurance is the ability to use your muscles for a long time, even after they get tired. Exercise E: Quadriceps, Isometric  1. Lie on your back with your left / right leg extended and your other knee bent. Put a rolled towel or small pillow under your knee if told by your health care provider. 2. Slowly tense the muscles in the front of your left / right thigh. You should see your kneecap slide up toward your hip or see increased dimpling just above the knee. This   motion will push the back of the knee toward the floor. 3. For __________ seconds, keep the muscle as tight as you can without increasing your pain. 4. Relax the muscles slowly and completely. Repeat __________ times. Complete this exercise __________ times a day. Exercise F: Straight Leg Raises - Quadriceps 1. Lie on your back with your left / right leg extended and your other knee bent. 2. Tense the muscles in the front of your left / right thigh. You should see your kneecap slide up or see increased dimpling just above the knee. Your thigh may  even shake a bit. 3. Keep these muscles tight as you raise your leg 4-6 inches (10-15 cm) off the floor. Do not let your knee bend. 4. Hold this position for __________ seconds. 5. Keep these muscles tense as you lower your leg. 6. Relax your muscles slowly and completely after each repetition. Repeat __________ times. Complete this exercise __________ times a day. Exercise G: Hamstring, Isometric 1. Lie on your back on a firm surface. 2. Bend your left / right knee approximately __________ degrees. 3. Dig your left / right heel into the surface as if you are trying to pull it toward your buttocks. Tighten the muscles in the back of your thighs to dig as hard as you can without increasing any pain. 4. Hold this position for __________ seconds. 5. Release the tension gradually and allow your muscles to relax completely for __________ seconds after each repetition. Repeat __________ times. Complete this exercise __________ times a day. Exercise H: Hamstring Curls  If told by your health care provider, do this exercise while wearing ankle weights. Begin with __________ weights. Then increase the weight by 1 lb (0.5 kg) increments. Do not wear ankle weights that are more than __________. 1. Lie on your abdomen with your legs straight. 2. Bend your left / right knee as far as you can without feeling pain. Keep your hips flat against the floor. 3. Hold this position for __________ seconds. 4. Slowly lower your leg to the starting position.  Repeat __________ times. Complete this exercise __________ times a day. Exercise I: Squats (Quadriceps) 1. Stand in front of a table, with your feet and knees pointing straight ahead. You may rest your hands on the table for balance but not for support. 2. Slowly bend your knees and lower your hips like you are going to sit in a chair. ? Keep your weight over your heels, not over your toes. ? Keep your lower legs upright so they are parallel with the table  legs. ? Do not let your hips go lower than your knees. ? Do not bend lower than told by your health care provider. ? If your knee pain increases, do not bend as low. 3. Hold the squat position for __________ seconds. 4. Slowly push with your legs to return to standing. Do not use your hands to pull yourself to standing. Repeat __________ times. Complete this exercise __________ times a day. Exercise J: Wall Slides (Quadriceps)  1. Lean your back against a smooth wall or door while you walk your feet out 18-24 inches (46-61 cm) from it. 2. Place your feet hip-width apart. 3. Slowly slide down the wall or door until your knees bend __________ degrees. Keep your knees over your heels, not over your toes. Keep your knees in line with your hips. 4. Hold for __________ seconds. Repeat __________ times. Complete this exercise __________ times a day. Exercise K: Straight Leg Raises -   Hip Abductors 1. Lie on your side with your left / right leg in the top position. Lie so your head, shoulder, knee, and hip line up. You may bend your bottom knee to help you keep your balance. 2. Roll your hips slightly forward so your hips are stacked directly over each other and your left / right knee is facing forward. 3. Leading with your heel, lift your top leg 4-6 inches (10-15 cm). You should feel the muscles in your outer hip lifting. ? Do not let your foot drift forward. ? Do not let your knee roll toward the ceiling. 4. Hold this position for __________ seconds. 5. Slowly return your leg to the starting position. 6. Let your muscles relax completely after each repetition. Repeat __________ times. Complete this exercise __________ times a day. Exercise L: Straight Leg Raises - Hip Extensors 1. Lie on your abdomen on a firm surface. You can put a pillow under your hips if that is more comfortable. 2. Tense the muscles in your buttocks and lift your left / right leg about 4-6 inches (10-15 cm). Keep your knee  straight as you lift your leg. 3. Hold this position for __________ seconds. 4. Slowly lower your leg to the starting position. 5. Let your leg relax completely after each repetition. Repeat __________ times. Complete this exercise __________ times a day. This information is not intended to replace advice given to you by your health care provider. Make sure you discuss any questions you have with your health care provider. Document Released: 08/08/2005 Document Revised: 06/18/2016 Document Reviewed: 07/31/2015 [employer] Interactive Patient Education  2018 [employer] Back Exercises The following exercises strengthen the muscles that help to support the back. They also help to keep the lower back flexible. Doing these exercises can help to prevent back pain or lessen existing pain. If you have back pain or discomfort, try doing these exercises 2-3 times each day or as told by your health care provider. When the pain goes away, do them once each day, but increase the number of times that you repeat the steps for each exercise (do more repetitions). If you do not have back pain or discomfort, do these exercises once each day or as told by your health care provider. Exercises Single Knee to Chest  Repeat these steps 3-5 times for each leg: 1. Lie on your back on a firm bed or the floor with your legs extended. 2. Bring one knee to your chest. Your other leg should stay extended and in contact with the floor. 3. Hold your knee in place by grabbing your knee or thigh. 4. Pull on your knee until you feel a gentle stretch in your lower back. 5. Hold the stretch for 10-30 seconds. 6. Slowly release and straighten your leg.  Pelvic Tilt  Repeat these steps 5-10 times: 1. Lie on your back on a firm bed or the floor with your legs extended. 2. Bend your knees so they are pointing toward the ceiling and your feet are flat on the floor. 3. Tighten your lower abdominal muscles to press your lower back  against the floor. This motion will tilt your pelvis so your tailbone points up toward the ceiling instead of pointing to your feet or the floor. 4. With gentle tension and even breathing, hold this position for 5-10 seconds.  Cat-Cow  Repeat these steps until your lower back becomes more flexible: 1. Get into a hands-and-knees position on a firm surface. Keep your hands under   your shoulders, and keep your knees under your hips. You may place padding under your knees for comfort. 2. Let your head hang down, and point your tailbone toward the floor so your lower back becomes rounded like the back of a cat. 3. Hold this position for 5 seconds. 4. Slowly lift your head and point your tailbone up toward the ceiling so your back forms a sagging arch like the back of a cow. 5. Hold this position for 5 seconds.  Press-Ups  Repeat these steps 5-10 times: 1. Lie on your abdomen (face-down) on the floor. 2. Place your palms near your head, about shoulder-width apart. 3. While you keep your back as relaxed as possible and keep your hips on the floor, slowly straighten your arms to raise the top half of your body and lift your shoulders. Do not use your back muscles to raise your upper torso. You may adjust the placement of your hands to make yourself more comfortable. 4. Hold this position for 5 seconds while you keep your back relaxed. 5. Slowly return to lying flat on the floor.  Bridges  Repeat these steps 10 times: 1. Lie on your back on a firm surface. 2. Bend your knees so they are pointing toward the ceiling and your feet are flat on the floor. 3. Tighten your buttocks muscles and lift your buttocks off of the floor until your waist is at almost the same height as your knees. You should feel the muscles working in your buttocks and the back of your thighs. If you do not feel these muscles, slide your feet 1-2 inches farther away from your buttocks. 4. Hold this position for 3-5  seconds. 5. Slowly lower your hips to the starting position, and allow your buttocks muscles to relax completely.  If this exercise is too easy, try doing it with your arms crossed over your chest. Abdominal Crunches  Repeat these steps 5-10 times: 1. Lie on your back on a firm bed or the floor with your legs extended. 2. Bend your knees so they are pointing toward the ceiling and your feet are flat on the floor. 3. Cross your arms over your chest. 4. Tip your chin slightly toward your chest without bending your neck. 5. Tighten your abdominal muscles and slowly raise your trunk (torso) high enough to lift your shoulder blades a tiny bit off of the floor. Avoid raising your torso higher than that, because it can put too much stress on your low back and it does not help to strengthen your abdominal muscles. 6. Slowly return to your starting position.  Back Lifts Repeat these steps 5-10 times: 1. Lie on your abdomen (face-down) with your arms at your sides, and rest your forehead on the floor. 2. Tighten the muscles in your legs and your buttocks. 3. Slowly lift your chest off of the floor while you keep your hips pressed to the floor. Keep the back of your head in line with the curve in your back. Your eyes should be looking at the floor. 4. Hold this position for 3-5 seconds. 5. Slowly return to your starting position.  Contact a health care provider if:  Your back pain or discomfort gets much worse when you do an exercise.  Your back pain or discomfort does not lessen within 2 hours after you exercise. If you have any of these problems, stop doing these exercises right away. Do not do them again unless your health care provider says that you can.   Get help right away if:  You develop sudden, severe back pain. If this happens, stop doing the exercises right away. Do not do them again unless your health care provider says that you can. This information is not intended to replace advice  given to you by your health care provider. Make sure you discuss any questions you have with your health care provider. Document Released: 11/01/2004 Document Revised: 02/01/2016 Document Reviewed: 11/18/2014 Elsevier Interactive Patient Education  2017 [employer]  

## 2018-02-27 LAB — PAN-ANCA
ANCA SCREEN: NEGATIVE
Myeloperoxidase Abs: <1 AI
Serine Protease 3: <1 AI

## 2018-02-27 LAB — URINALYSIS, ROUTINE W REFLEX MICROSCOPIC
BILIRUBIN URINE: NEGATIVE
Bacteria, UA: NONE SEEN /HPF
GLUCOSE, UA: NEGATIVE
HGB URINE DIPSTICK: NEGATIVE
HYALINE CAST: NONE SEEN /LPF
KETONES UR: NEGATIVE
Nitrite: NEGATIVE
PH: 6 (ref 5.0–8.0)
PROTEIN: NEGATIVE
Specific Gravity, Urine: 1.026 (ref 1.001–1.03)

## 2018-02-27 LAB — IGG, IGA, IGM
IGM, SERUM: 256 mg/dL (ref 48–271)
IgG (Immunoglobin G), Serum: 1278 mg/dL (ref 694–1618)
Immunoglobulin A: 276 mg/dL (ref 81–463)

## 2018-02-27 LAB — PROTEIN ELECTROPHORESIS, SERUM, WITH REFLEX
ALPHA 1: 0.4 g/dL — AB (ref 0.2–0.3)
ALPHA 2: 0.7 g/dL (ref 0.5–0.9)
Albumin ELP: 3.9 g/dL (ref 3.8–4.8)
BETA GLOBULIN: 0.5 g/dL (ref 0.4–0.6)
Beta 2: 0.5 g/dL (ref 0.2–0.5)
Gamma Globulin: 1.3 g/dL (ref 0.8–1.7)
Total Protein: 7.4 g/dL (ref 6.1–8.1)

## 2018-02-27 LAB — RHEUMATOID FACTOR

## 2018-02-27 LAB — SEDIMENTATION RATE: SED RATE: 41 mm/h — AB (ref 0–20)

## 2018-02-27 LAB — CK: Total CK: 59 U/L (ref 29–143)

## 2018-02-27 LAB — ALDOLASE: ALDOLASE: 3.7 U/L (ref <=8.1)

## 2018-02-28 NOTE — Progress Notes (Signed)
Will discuss at the fu visit. Please, forward results to her PCP.

## 2018-03-13 ENCOUNTER — Encounter: Payer: Self-pay | Admitting: Family Medicine

## 2018-03-14 ENCOUNTER — Other Ambulatory Visit: Payer: Self-pay | Admitting: Family Medicine

## 2018-03-20 NOTE — Progress Notes (Deleted)
Office Visit Note  Patient: Bethany Barrera             Date of Birth: 03/14/1996           MRN: [id number]             PCP: Susy Frizzle, MD Referring: Susy Frizzle, MD Visit Date: 04/01/2018 Occupation: '@GUAROCC' @    Subjective:  No chief complaint on file.   History of Present Illness: Bethany Barrera is a 22 y.o. female ***   Activities of Daily Living:  Patient reports morning stiffness for *** {minute/hour:19697}.   Patient {ACTIONS;DENIES/REPORTS:21021675::"Denies"} nocturnal pain.  Difficulty dressing/grooming: {ACTIONS;DENIES/REPORTS:21021675::"Denies"} Difficulty climbing stairs: {ACTIONS;DENIES/REPORTS:21021675::"Denies"} Difficulty getting out of chair: {ACTIONS;DENIES/REPORTS:21021675::"Denies"} Difficulty using hands for taps, buttons, cutlery, and/or writing: {ACTIONS;DENIES/REPORTS:21021675::"Denies"}   No Rheumatology ROS completed.   PMFS History:  Patient Active Problem List   Diagnosis Date Noted  . Vitamin D deficiency 02/25/2018  . Paroxysmal atrial tachycardia by electrocardiogram ([place]) 02/22/2016  . Weakness 01/11/2016  . Paresthesia 01/11/2016  . GERD (gastroesophageal reflux disease)     Past Medical History:  Diagnosis Date  . Anxiety   . Gastritis   . GERD (gastroesophageal reflux disease)   . Muscle weakness   . Seasonal allergies     Family History  Problem Relation Age of Onset  . Clotting disorder Maternal Grandfather   . Diabetes Maternal Grandfather   . Heart disease Maternal Grandfather   . Breast cancer Paternal Grandmother   . Diabetes Mother   . Other Mother        gallstones  . Hypertension Mother   . Heart disease Mother   . Hyperlipidemia Father   . Other Sister        pre-diabetes  . Other Sister        gallstones   Past Surgical History:  Procedure Laterality Date  . NO PAST SURGERIES     Social History   Social History Narrative   Lives at home with mother and father.   Right-handed.     No caffeine use.     Objective: Vital Signs: There were no vitals taken for this visit.   Physical Exam   Musculoskeletal Exam: ***  CDAI Exam: No CDAI exam completed.    Investigation: No additional findings. 02/25/18 UA negative,, SPEP negative, immunoglobulins normal, ANCA negative, CK 59, aldolase normal, RF negative, ESR 41 12/24/17: ANA 1:160 speckled, TSH 3.47, Sed rate 60, CRP 39.4, CK 44, smith -, dsDNA 1, Vitamin D 17   Imaging: Xr Thoracic Spine 2 View  Result Date: 02/25/2018 There is no disc space narrowing no syndesmophytes.  Unremarkable x-ray of the thoracic spine.  Xr Lumbar Spine 2-3 Views  Result Date: 02/25/2018 Lumbar spine x-ray was unremarkable without any disc space narrowing or spurring of syndesmophytes.  SI joints appeared normal.   Speciality Comments: No specialty comments available.    Procedures:  No procedures performed Allergies: Sulfa antibiotics and Tramadol   Assessment / Plan:     Visit Diagnoses: Positive ANA (antinuclear antibody) - ANA 1: 160 speckled, dsDNA negative, Smith negative.  ESR improved from 60 to 40.  Myalgia - CK and aldolase normal  Chronic midline low back pain without sciatica - X-rays of thoracic and lumbar spine obtained last visit were normal.  Vitamin D deficiency    History of gastroesophageal reflux (GERD)  History of gastritis  Paroxysmal atrial tachycardia by electrocardiogram ([place])  History of anxiety   Orders: No orders of the  defined types were placed in this encounter.  No orders of the defined types were placed in this encounter.   Face-to-face time spent with patient was *** minutes. 50% of time was spent in counseling and coordination of care.  Follow-Up Instructions: No follow-ups on file.   Bo Merino, MD  Note - This record has been created using Editor, commissioning.  Chart creation errors have been sought, but may not always  have been located. Such creation  errors do not reflect on  the standard of medical care.

## 2018-03-26 ENCOUNTER — Other Ambulatory Visit: Payer: Self-pay | Admitting: Family Medicine

## 2018-03-26 MED ORDER — ESOMEPRAZOLE MAGNESIUM 40 MG PO CPDR: DELAYED_RELEASE_CAPSULE | ORAL | 12 refills | Status: DC

## 2018-03-28 ENCOUNTER — Ambulatory Visit: Payer: 59 | Admitting: Family Medicine

## 2018-04-01 ENCOUNTER — Ambulatory Visit: Payer: 59 | Admitting: Family Medicine

## 2018-04-01 ENCOUNTER — Ambulatory Visit: Payer: 59 | Admitting: Rheumatology

## 2018-04-07 ENCOUNTER — Encounter: Payer: Self-pay | Admitting: Family Medicine

## 2018-04-09 NOTE — Progress Notes (Deleted)
Office Visit Note  Patient: Bethany Barrera             Date of Birth: [birth year]           MRN: [id number]             PCP: Donita Brooks, MD Referring: Donita Brooks, MD Visit Date: 04/18/2018 Occupation: @GUAROCC @    Subjective:  No chief complaint on file.   History of Present Illness: Bethany Barrera is a 22 y.o. female ***   Activities of Daily Living:  Patient reports morning stiffness for *** {minute/hour:19697}.   Patient {ACTIONS;DENIES/REPORTS:21021675::"Denies"} nocturnal pain.  Difficulty dressing/grooming: {ACTIONS;DENIES/REPORTS:21021675::"Denies"} Difficulty climbing stairs: {ACTIONS;DENIES/REPORTS:21021675::"Denies"} Difficulty getting out of chair: {ACTIONS;DENIES/REPORTS:21021675::"Denies"} Difficulty using hands for taps, buttons, cutlery, and/or writing: {ACTIONS;DENIES/REPORTS:21021675::"Denies"}   No Rheumatology ROS completed.   PMFS History:  Patient Active Problem List   Diagnosis Date Noted  . Vitamin D deficiency 02/25/2018  . Paroxysmal atrial tachycardia by electrocardiogram ([facility]) 02/22/2016  . Weakness 01/11/2016  . Paresthesia 01/11/2016  . GERD (gastroesophageal reflux disease)     Past Medical History:  Diagnosis Date  . Anxiety   . Gastritis   . GERD (gastroesophageal reflux disease)   . Muscle weakness   . Seasonal allergies     Family History  Problem Relation Age of Onset  . Clotting disorder Maternal Grandfather   . Diabetes Maternal Grandfather   . Heart disease Maternal Grandfather   . Breast cancer Paternal Grandmother   . Diabetes Mother   . Other Mother        gallstones  . Hypertension Mother   . Heart disease Mother   . Hyperlipidemia Father   . Other Sister        pre-diabetes  . Other Sister        gallstones   Past Surgical History:  Procedure Laterality Date  . NO PAST SURGERIES     Social History   Social History Narrative   Lives at home with mother and father.   Right-handed.     No caffeine use.     Objective: Vital Signs: There were no vitals taken for this visit.   Physical Exam   Musculoskeletal Exam: ***  CDAI Exam: No CDAI exam completed.    Investigation: Findings:  02/25/18: ANA 1:160 speckled, sed rate 60, CRP 39.4, smith-,dsDNA 1, ANCA screen neg, CK 59, Sed 41, Aldolase 3.7, RF<14, immunoglobulins WNL   Component     Latest Ref Rng & Units 02/25/2018  Total Protein     6.1 - 8.1 g/dL 7.4  Albumin ELP     3.8 - 4.8 g/dL 3.9  Alpha 1     0.2 - 0.3 g/dL 0.4 (H)  Alpha 2     0.5 - 0.9 g/dL 0.7  Beta Globulin     0.4 - 0.6 g/dL 0.5  Beta 2     0.2 - 0.5 g/dL 0.5  Gamma Globulin     0.8 - 1.7 g/dL 1.3  SPE Interp.        Immunoglobulin A     81 - 463 mg/dL 130  IgG (Immunoglobin G), Serum     694 - 1,618 mg/dL 8,657  IgM, Serum     48 - 271 mg/dL 846  ANCA SCREEN     NEGATIVE NEGATIVE  Myeloperoxidase Abs     AI <1.0  Serine Protease 3     AI <1.0  CK Total     29 - 143 U/L  59  Sed Rate     0 - 20 mm/h 41 (H)  Aldolase     < OR = 8.1 U/L 3.7  RA Latex Turbid.     <14 IU/mL <14   CBC Latest Ref Rng & Units 11/07/2017 12/04/2016 02/27/2016  WBC 3.8 - 10.8 Thousand/uL 6.8 4.3 5.7  Hemoglobin 11.7 - 15.5 g/dL 29.9 24.2 68.3  Hematocrit 35.0 - 45.0 % 35.4 41.1 40.3  Platelets 140 - 400 Thousand/uL 329 335 342   CMP Latest Ref Rng & Units 02/25/2018 11/07/2017 12/04/2016  Glucose 65 - 99 mg/dL - 96 84  BUN 7 - 25 mg/dL - 9 6(L)  Creatinine 4.19 - 1.10 mg/dL - 6.22 2.97  Sodium 989 - 146 mmol/L - 137 140  Potassium 3.5 - 5.3 mmol/L - 4.0 4.2  Chloride 98 - 110 mmol/L - 105 106  CO2 20 - 32 mmol/L - 21 21  Calcium 8.6 - 10.2 mg/dL - 8.9 9.5  Total Protein 6.1 - 8.1 g/dL 7.4 6.9 -  Total Bilirubin 0.2 - 1.2 mg/dL - 0.3 -  Alkaline Phos 33 - 115 U/L - - -  AST 10 - 30 U/L - 16 -  ALT 6 - 29 U/L - 14 -     Imaging: No results found.  Speciality Comments: No specialty comments available.    Procedures:  No  procedures performed Allergies: Sulfa antibiotics and Tramadol   Assessment / Plan:     Visit Diagnoses: Myofascial pain syndrome - XR of thoracic and lumbar spine unremarkable no muscle weakness on exam, DTRs intact myalgias for 2 years   Positive ANA (antinuclear antibody) - ANA 1:160 speckled, sed rate 60, CRP 39.4, smith-,dsDNA 1no clinical features of autoimmune disease   History of gastroesophageal reflux (GERD)  History of gastritis  Paroxysmal atrial tachycardia by electrocardiogram ([facility])  History of anxiety  Vitamin D deficiency    Orders: No orders of the defined types were placed in this encounter.  No orders of the defined types were placed in this encounter.   Face-to-face time spent with patient was *** minutes. Greater than 50% of time was spent in counseling and coordination of care.  Follow-Up Instructions: No follow-ups on file.   Gearldine Bienenstock, PA-C  Note - This record has been created using Dragon software.  Chart creation errors have been sought, but may not always  have been located. Such creation errors do not reflect on  the standard of medical care.

## 2018-04-11 ENCOUNTER — Ambulatory Visit: Payer: 59 | Admitting: Family Medicine

## 2018-04-18 ENCOUNTER — Ambulatory Visit: Payer: 59 | Admitting: Rheumatology

## 2018-05-05 ENCOUNTER — Encounter: Payer: Self-pay | Admitting: Family Medicine

## 2018-05-19 NOTE — Progress Notes (Deleted)
Office Visit Note  Patient: Bethany Barrera             Date of Birth: March 13, 1996           MRN: [id number]             PCP: Susy Frizzle, MD Referring: Susy Frizzle, MD Visit Date: 05/21/2018 Occupation: '@GUAROCC'$ @  Subjective:  No chief complaint on file.   History of Present Illness: Bethany Barrera is a 22 y.o. female ***   Activities of Daily Living:  Patient reports morning stiffness for *** {minute/hour:19697}.   Patient {ACTIONS;DENIES/REPORTS:21021675::"Denies"} nocturnal pain.  Difficulty dressing/grooming: {ACTIONS;DENIES/REPORTS:21021675::"Denies"} Difficulty climbing stairs: {ACTIONS;DENIES/REPORTS:21021675::"Denies"} Difficulty getting out of chair: {ACTIONS;DENIES/REPORTS:21021675::"Denies"} Difficulty using hands for taps, buttons, cutlery, and/or writing: {ACTIONS;DENIES/REPORTS:21021675::"Denies"}  No Rheumatology ROS completed.   PMFS History:  Patient Active Problem List   Diagnosis Date Noted  . Vitamin D deficiency 02/25/2018  . Paroxysmal atrial tachycardia by electrocardiogram ([place]) 02/22/2016  . Weakness 01/11/2016  . Paresthesia 01/11/2016  . GERD (gastroesophageal reflux disease)     Past Medical History:  Diagnosis Date  . Anxiety   . Gastritis   . GERD (gastroesophageal reflux disease)   . Muscle weakness   . Seasonal allergies     Family History  Problem Relation Age of Onset  . Clotting disorder Maternal Grandfather   . Diabetes Maternal Grandfather   . Heart disease Maternal Grandfather   . Breast cancer Paternal Grandmother   . Diabetes Mother   . Other Mother        gallstones  . Hypertension Mother   . Heart disease Mother   . Hyperlipidemia Father   . Other Sister        pre-diabetes  . Other Sister        gallstones   Past Surgical History:  Procedure Laterality Date  . NO PAST SURGERIES     Social History   Social History Narrative   Lives at home with mother and father.   Right-handed.   No  caffeine use.    Objective: Vital Signs: There were no vitals taken for this visit.   Physical Exam   Musculoskeletal Exam: ***  CDAI Exam: CDAI Score: Not documented Patient Global Assessment: Not documented; Provider Global Assessment: Not documented Swollen: Not documented; Tender: Not documented Joint Exam   Not documented   There is currently no information documented on the homunculus. Go to the Rheumatology activity and complete the homunculus joint exam.  Investigation: No additional findings.  Imaging: No results found.  Recent Labs: Lab Results  Component Value Date   WBC 6.8 11/07/2017   HGB 12.1 11/07/2017   PLT 329 11/07/2017   NA 137 11/07/2017   K 4.0 11/07/2017   CL 105 11/07/2017   CO2 21 11/07/2017   GLUCOSE 96 11/07/2017   BUN 9 11/07/2017   CREATININE 0.76 11/07/2017   BILITOT 0.3 11/07/2017   ALKPHOS 62 03/26/2016   AST 16 11/07/2017   ALT 14 11/07/2017   PROT 7.4 02/25/2018   ALBUMIN 3.9 03/26/2016   CALCIUM 8.9 11/07/2017   GFRAA 128 11/07/2017  02/2018 UA -, CK 59, Aldolase normal, ESR 41, SPEP non sp, Igs normal, ANCA negative, RF-  Speciality Comments: No specialty comments available.  Procedures:  No procedures performed Allergies: Sulfa antibiotics and Tramadol   Assessment / Plan:     Visit Diagnoses: No diagnosis found.   Orders: No orders of the defined types were placed in this encounter.  No orders of the defined types were placed in this encounter.   Face-to-face time spent with patient was *** minutes. Greater than 50% of time was spent in counseling and coordination of care.  Follow-Up Instructions: No follow-ups on file.   Bo Merino, MD  Note - This record has been created using Editor, commissioning.  Chart creation errors have been sought, but may not always  have been located. Such creation errors do not reflect on  the standard of medical care.

## 2018-05-21 ENCOUNTER — Ambulatory Visit: Payer: 59 | Admitting: Rheumatology

## 2018-05-23 DIAGNOSIS — R768 Other specified abnormal immunological findings in serum: Secondary | ICD-10-CM | POA: Insufficient documentation

## 2018-05-23 NOTE — Progress Notes (Deleted)
Office Visit Note  Patient: Bethany Barrera             Date of Birth: August 08, 1996           MRN: [id number]             PCP: Susy Frizzle, MD Referring: Susy Frizzle, MD Visit Date: 06/05/2018 Occupation: _0 @  Subjective:  No chief complaint on file.   History of Present Illness: Bethany Barrera is a 22 y.o. female ***   Activities of Daily Living:  Patient reports morning stiffness for *** {minute/hour:19697}.   Patient {ACTIONS;DENIES/REPORTS:21021675::"Denies"} nocturnal pain.  Difficulty dressing/grooming: {ACTIONS;DENIES/REPORTS:21021675::"Denies"} Difficulty climbing stairs: {ACTIONS;DENIES/REPORTS:21021675::"Denies"} Difficulty getting out of chair: {ACTIONS;DENIES/REPORTS:21021675::"Denies"} Difficulty using hands for taps, buttons, cutlery, and/or writing: {ACTIONS;DENIES/REPORTS:21021675::"Denies"}  No Rheumatology ROS completed.   PMFS History:  Patient Active Problem List   Diagnosis Date Noted  . Vitamin D deficiency 02/25/2018  . Paroxysmal atrial tachycardia by electrocardiogram ([place]) 02/22/2016  . Weakness 01/11/2016  . Paresthesia 01/11/2016  . GERD (gastroesophageal reflux disease)     Past Medical History:  Diagnosis Date  . Anxiety   . Gastritis   . GERD (gastroesophageal reflux disease)   . Muscle weakness   . Seasonal allergies     Family History  Problem Relation Age of Onset  . Clotting disorder Maternal Grandfather   . Diabetes Maternal Grandfather   . Heart disease Maternal Grandfather   . Breast cancer Paternal Grandmother   . Diabetes Mother   . Other Mother        gallstones  . Hypertension Mother   . Heart disease Mother   . Hyperlipidemia Father   . Other Sister        pre-diabetes  . Other Sister        gallstones   Past Surgical History:  Procedure Laterality Date  . NO PAST SURGERIES     Social History   Social History Narrative   Lives at home with mother and father.   Right-handed.   No  caffeine use.    Objective: Vital Signs: There were no vitals taken for this visit.   Physical Exam   Musculoskeletal Exam: ***  CDAI Exam: CDAI Score: Not documented Patient Global Assessment: Not documented; Provider Global Assessment: Not documented Swollen: Not documented; Tender: Not documented Joint Exam   Not documented   There is currently no information documented on the homunculus. Go to the Rheumatology activity and complete the homunculus joint exam.  Investigation: No additional findings.  Imaging: No results found.  Recent Labs: Lab Results  Component Value Date   WBC 6.8 11/07/2017   HGB 12.1 11/07/2017   PLT 329 11/07/2017   NA 137 11/07/2017   K 4.0 11/07/2017   CL 105 11/07/2017   CO2 21 11/07/2017   GLUCOSE 96 11/07/2017   BUN 9 11/07/2017   CREATININE 0.76 11/07/2017   BILITOT 0.3 11/07/2017   ALKPHOS 62 03/26/2016   AST 16 11/07/2017   ALT 14 11/07/2017   PROT 7.4 02/25/2018   ALBUMIN 3.9 03/26/2016   CALCIUM 8.9 11/07/2017   GFRAA 128 11/07/2017  Feb 25, 2018 UA negative, SPEP alpha-1 globulin increased , immunoglobulins normal, ANCA negative, RF negative, CK normal, aldolase normal, ESR 41  Speciality Comments: No specialty comments available.  Procedures:  No procedures performed Allergies: Sulfa antibiotics and Tramadol   Assessment / Plan:     Visit Diagnoses: No diagnosis found.   Orders: No orders of the defined types were  placed in this encounter.  No orders of the defined types were placed in this encounter.   Face-to-face time spent with patient was *** minutes. Greater than 50% of time was spent in counseling and coordination of care.  Follow-Up Instructions: No follow-ups on file.   Bo Merino, MD  Note - This record has been created using Editor, commissioning.  Chart creation errors have been sought, but may not always  have been located. Such creation errors do not reflect on  the standard of medical care.

## 2018-06-05 ENCOUNTER — Ambulatory Visit: Payer: 59 | Admitting: Rheumatology

## 2018-07-01 ENCOUNTER — Other Ambulatory Visit: Payer: Self-pay | Admitting: Family Medicine

## 2018-07-02 NOTE — Progress Notes (Deleted)
Office Visit Note  Patient: Bethany Barrera             Date of Birth: 1996/01/24           MRN: [id number]             PCP: Susy Frizzle, MD Referring: Susy Frizzle, MD Visit Date: 07/16/2018 Occupation: '@GUAROCC' @  Subjective:  No chief complaint on file.   History of Present Illness: Bethany Barrera is a 22 y.o. female ***   Activities of Daily Living:  Patient reports morning stiffness for *** {minute/hour:19697}.   Patient {ACTIONS;DENIES/REPORTS:21021675::"Denies"} nocturnal pain.  Difficulty dressing/grooming: {ACTIONS;DENIES/REPORTS:21021675::"Denies"} Difficulty climbing stairs: {ACTIONS;DENIES/REPORTS:21021675::"Denies"} Difficulty getting out of chair: {ACTIONS;DENIES/REPORTS:21021675::"Denies"} Difficulty using hands for taps, buttons, cutlery, and/or writing: {ACTIONS;DENIES/REPORTS:21021675::"Denies"}  No Rheumatology ROS completed.   PMFS History:  Patient Active Problem List   Diagnosis Date Noted  . Positive ANA (antinuclear antibody) 05/23/2018  . Vitamin D deficiency 02/25/2018  . Paroxysmal atrial tachycardia by electrocardiogram ([place]) 02/22/2016  . Weakness 01/11/2016  . Paresthesia 01/11/2016  . GERD (gastroesophageal reflux disease)     Past Medical History:  Diagnosis Date  . Anxiety   . Gastritis   . GERD (gastroesophageal reflux disease)   . Muscle weakness   . Seasonal allergies     Family History  Problem Relation Age of Onset  . Clotting disorder Maternal Grandfather   . Diabetes Maternal Grandfather   . Heart disease Maternal Grandfather   . Breast cancer Paternal Grandmother   . Diabetes Mother   . Other Mother        gallstones  . Hypertension Mother   . Heart disease Mother   . Hyperlipidemia Father   . Other Sister        pre-diabetes  . Other Sister        gallstones   Past Surgical History:  Procedure Laterality Date  . NO PAST SURGERIES     Social History   Social History Narrative   Lives at  home with mother and father.   Right-handed.   No caffeine use.    Objective: Vital Signs: There were no vitals taken for this visit.   Physical Exam   Musculoskeletal Exam: ***  CDAI Exam: CDAI Score: Not documented Patient Global Assessment: Not documented; Provider Global Assessment: Not documented Swollen: Not documented; Tender: Not documented Joint Exam   Not documented   There is currently no information documented on the homunculus. Go to the Rheumatology activity and complete the homunculus joint exam.  Investigation: No additional findings.  Imaging: No results found.  Recent Labs: Lab Results  Component Value Date   WBC 6.8 11/07/2017   HGB 12.1 11/07/2017   PLT 329 11/07/2017   NA 137 11/07/2017   K 4.0 11/07/2017   CL 105 11/07/2017   CO2 21 11/07/2017   GLUCOSE 96 11/07/2017   BUN 9 11/07/2017   CREATININE 0.76 11/07/2017   BILITOT 0.3 11/07/2017   ALKPHOS 62 03/26/2016   AST 16 11/07/2017   ALT 14 11/07/2017   PROT 7.4 02/25/2018   ALBUMIN 3.9 03/26/2016   CALCIUM 8.9 11/07/2017   GFRAA 128 11/07/2017  Feb 25, 2018 ESR 41 SPEP alpha-1 globulin increased noted, CK, aldolase, immunoglobulins, pan-ANCA, RF negative, UA negative  Speciality Comments: No specialty comments available.  Procedures:  No procedures performed Allergies: Sulfa antibiotics and Tramadol   Assessment / Plan:     Visit Diagnoses: Positive ANA (antinuclear antibody) - ANA 1: 160 speckled, ENA  negative, ESR elevated, no clinical features of autoimmune disease.  Myofascial pain - CK normal  Chronic midline low back pain without sciatica - A handout on exercises were given.  Vitamin D deficiency   History of gastroesophageal reflux (GERD)  History of gastritis  Paroxysmal atrial tachycardia by electrocardiogram ([place])  History of anxiety Orders: No orders of the defined types were placed in this encounter.  No orders of the defined types were placed in this  encounter.   Face-to-face time spent with patient was *** minutes. Greater than 50% of time was spent in counseling and coordination of care.  Follow-Up Instructions: No follow-ups on file.   Bo Merino, MD  Note - This record has been created using Editor, commissioning.  Chart creation errors have been sought, but may not always  have been located. Such creation errors do not reflect on  the standard of medical care.

## 2018-07-16 ENCOUNTER — Ambulatory Visit: Payer: 59 | Admitting: Physician Assistant

## 2018-07-29 DIAGNOSIS — Z8659 Personal history of other mental and behavioral disorders: Secondary | ICD-10-CM | POA: Insufficient documentation

## 2018-07-29 NOTE — Progress Notes (Deleted)
Office Visit Note  Patient: Bethany Barrera             Date of Birth: [birth year]           MRN: [id number]             PCP: Susy Frizzle, MD Referring: Susy Frizzle, MD Visit Date: 08/05/2018 Occupation: '@GUAROCC' @  Subjective:  No chief complaint on file.   History of Present Illness: [name] is a 22 y.o. female ***   Activities of Daily Living:  Patient reports morning stiffness for *** {minute/hour:19697}.   Patient {ACTIONS;DENIES/REPORTS:21021675::"Denies"} nocturnal pain.  Difficulty dressing/grooming: {ACTIONS;DENIES/REPORTS:21021675::"Denies"} Difficulty climbing stairs: {ACTIONS;DENIES/REPORTS:21021675::"Denies"} Difficulty getting out of chair: {ACTIONS;DENIES/REPORTS:21021675::"Denies"} Difficulty using hands for taps, buttons, cutlery, and/or writing: {ACTIONS;DENIES/REPORTS:21021675::"Denies"}  No Rheumatology ROS completed.   PMFS History:  Patient Active Problem List   Diagnosis Date Noted  . Positive ANA (antinuclear antibody) 05/23/2018  . Vitamin D deficiency 02/25/2018  . Paroxysmal atrial tachycardia by electrocardiogram ([employer]) 02/22/2016  . Weakness 01/11/2016  . Paresthesia 01/11/2016  . GERD (gastroesophageal reflux disease)     Past Medical History:  Diagnosis Date  . Anxiety   . Gastritis   . GERD (gastroesophageal reflux disease)   . Muscle weakness   . Seasonal allergies     Family History  Problem Relation Age of Onset  . Clotting disorder Maternal Grandfather   . Diabetes Maternal Grandfather   . Heart disease Maternal Grandfather   . Breast cancer Paternal Grandmother   . Diabetes Mother   . Other Mother        gallstones  . Hypertension Mother   . Heart disease Mother   . Hyperlipidemia Father   . Other Sister        pre-diabetes  . Other Sister        gallstones   Past Surgical History:  Procedure Laterality Date  . NO PAST SURGERIES     Social History   Social History Narrative   Lives at  home with mother and father.   Right-handed.   No caffeine use.    Objective: Vital Signs: There were no vitals taken for this visit.   Physical Exam   Musculoskeletal Exam: ***  CDAI Exam: CDAI Score: Not documented Patient Global Assessment: Not documented; Provider Global Assessment: Not documented Swollen: Not documented; Tender: Not documented Joint Exam   Not documented   There is currently no information documented on the homunculus. Go to the Rheumatology activity and complete the homunculus joint exam.  Investigation: No additional findings. UA  Negative, SPEP alpha 1 globulin increased, immunoglobulins normal, ANCA negative, CK 59, aldolase normal, RF negative, ESR 41 Imaging: No results found.  Recent Labs: Lab Results  Component Value Date   WBC 6.8 11/07/2017   HGB 12.1 11/07/2017   PLT 329 11/07/2017   NA 137 11/07/2017   K 4.0 11/07/2017   CL 105 11/07/2017   CO2 21 11/07/2017   GLUCOSE 96 11/07/2017   BUN 9 11/07/2017   CREATININE 0.76 11/07/2017   BILITOT 0.3 11/07/2017   ALKPHOS 62 03/26/2016   AST 16 11/07/2017   ALT 14 11/07/2017   PROT 7.4 02/25/2018   ALBUMIN 3.9 03/26/2016   CALCIUM 8.9 11/07/2017   GFRAA 128 11/07/2017    Speciality Comments: No specialty comments available.  Procedures:  No procedures performed Allergies: Sulfa antibiotics and Tramadol   Assessment / Plan:     Visit Diagnoses: No diagnosis found.   Orders: No orders  of the defined types were placed in this encounter.  No orders of the defined types were placed in this encounter.   Face-to-face time spent with patient was *** minutes. Greater than 50% of time was spent in counseling and coordination of care.  Follow-Up Instructions: No follow-ups on file.   Bo Merino, MD  Note - This record has been created using Editor, commissioning.  Chart creation errors have been sought, but may not always  have been located. Such creation errors do not reflect  on  the standard of medical care.

## 2018-08-05 ENCOUNTER — Ambulatory Visit: Payer: 59 | Admitting: Rheumatology

## 2019-02-25 ENCOUNTER — Encounter: Payer: Self-pay | Admitting: Family Medicine

## 2019-04-02 ENCOUNTER — Other Ambulatory Visit: Payer: Self-pay | Admitting: Family Medicine

## 2019-04-07 ENCOUNTER — Other Ambulatory Visit: Payer: Self-pay | Admitting: Family Medicine

## 2019-04-23 ENCOUNTER — Ambulatory Visit: Payer: 59 | Admitting: Family Medicine

## 2019-04-30 ENCOUNTER — Encounter: Payer: Self-pay | Admitting: Family Medicine

## 2019-04-30 ENCOUNTER — Other Ambulatory Visit: Payer: Self-pay | Admitting: Family Medicine

## 2019-04-30 ENCOUNTER — Ambulatory Visit: Payer: 59 | Admitting: Family Medicine

## 2019-04-30 ENCOUNTER — Other Ambulatory Visit: Payer: Self-pay

## 2019-04-30 VITALS — BP 126/74 | HR 98 | Temp 98.5°F | Resp 16 | Ht 65.0 in | Wt 253.0 lb

## 2019-04-30 DIAGNOSIS — R768 Other specified abnormal immunological findings in serum: Secondary | ICD-10-CM

## 2019-04-30 DIAGNOSIS — M791 Myalgia, unspecified site: Secondary | ICD-10-CM | POA: Diagnosis not present

## 2019-04-30 DIAGNOSIS — M6281 Muscle weakness (generalized): Secondary | ICD-10-CM

## 2019-04-30 DIAGNOSIS — R700 Elevated erythrocyte sedimentation rate: Secondary | ICD-10-CM

## 2019-04-30 DIAGNOSIS — E559 Vitamin D deficiency, unspecified: Secondary | ICD-10-CM | POA: Diagnosis not present

## 2019-04-30 MED ORDER — ONDANSETRON HCL 4 MG PO TABS: 4.0000 mg | ORAL_TABLET | Freq: Three times a day (TID) | ORAL | 0 refills | Status: DC | PRN

## 2019-04-30 NOTE — Progress Notes (Signed)
Subjective:    Patient ID: Bethany Barrera, female    DOB: Mar 15, 1996, 23 y.o.   MRN: [id number]  Mouth Lesions  Associated symptoms include mouth sores.  02/27/16 Patient is a very pleasant 24 year old African-American female who presents today to establish care. She has a very complicated chief complaint. Symptoms began approximately 3 months ago. Over the last 3 months she reports losing approximately 60 pounds. This is due to the fact that she is unable to eat. Subjectively she is unable to open her jaw due to pain in the masseter muscles. She also reports diffuse muscle weakness in her arms and in her legs. She reports numbness and tingling in her scalp and in her arms and in her legs. It is not waxing and waning but constant. Her previous PCP and her neurologist have been unable to isolate the cause of her symptoms as of yet. I reviewed the lab work and workup to date including an MRI of the brain that was normal, Lyme studies that were normal, TSH was normal, sedimentation rate slightly high. CRP was slightly high. EMG and nerve conduction studies reveal no evidence of neuropathy or myopathy. She has an appointment to see a rheumatologist on Wednesday. She went to the emergency room recently and was diagnosed with atrial tachycardia presumed secondary to dehydration. They debated starting her on prednisone but the patient denies taking prednisone at the present time. Lab workup in the emergency room was significant for a low potassium. Workup as an outpatient is also significant for low vitamin D.  At that time, my plan was: MRI of the brain and lab workup to date has been normal. At this point I will evaluate for other laboratory causes of diffuse muscle weakness including checking a copper level given her significant weight loss she may have copper deficiency, I will check a vitamin B12, I'll check a phosphate as well as a magnesium level. I will check a sedimentation rate and a CK to see if  these are persistent elevations or spurious one-time elevations. I will check a CK and aldolase to evaluate for any evidence of inflammatory myositis. If lab work suggests inflammatory myositis, I would recommend a muscle biopsy under the care of a rheumatologist to determine if she has an inflammatory myopathy. If lab workup is normal, I would recommend a MRI of the C-spine, T-spine, and L-spine to evaluate for any evidence of spinal cord abnormalities or white matter disease. She has had lab workup for myasthenia gravis which was normal. I would even consider an empiric trial of pyridostigmine.  No visits with results within 2 Week(s) from this visit.  Latest known visit with results is:  Office Visit on 02/25/2018  Component Date Value Ref Range Status   Color, Urine 02/25/2018 YELLOW  YELLOW Final   APPearance 02/25/2018 CLOUDY* CLEAR Final   Specific Gravity, Urine 02/25/2018 1.026  1.001 - 1.03 Final   pH 02/25/2018 6.0  5.0 - 8.0 Final   Glucose, UA 02/25/2018 NEGATIVE  NEGATIVE Final   Bilirubin Urine 02/25/2018 NEGATIVE  NEGATIVE Final   Ketones, ur 02/25/2018 NEGATIVE  NEGATIVE Final   Hgb urine dipstick 02/25/2018 NEGATIVE  NEGATIVE Final   Protein, ur 02/25/2018 NEGATIVE  NEGATIVE Final   Nitrite 02/25/2018 NEGATIVE  NEGATIVE Final   Leukocytes, UA 02/25/2018 2+* NEGATIVE Final   WBC, UA 02/25/2018 10-20* 0 - 5 /HPF Final   RBC / HPF 02/25/2018 0-2  0 - 2 /HPF Final   Squamous Epithelial /  LPF 02/25/2018 0-5  < OR = 5 /HPF Final   Bacteria, UA 02/25/2018 NONE SEEN  NONE SEEN /HPF Final   Hyaline Cast 02/25/2018 NONE SEEN  NONE SEEN /LPF Final   Total CK 02/25/2018 59  29 - 143 U/L Final   Sed Rate 02/25/2018 41* 0 - 20 mm/h Final   Aldolase 02/25/2018 3.7  < OR = 8.1 U/L Final   Total Protein 02/25/2018 7.4  6.1 - 8.1 g/dL Final   Albumin ELP 52/84/132405/21/2019 3.9  3.8 - 4.8 g/dL Final   Alpha 1 40/10/272505/21/2019 0.4* 0.2 - 0.3 g/dL Final   Alpha 2 36/64/403405/21/2019 0.7  0.5  - 0.9 g/dL Final   Beta Globulin 74/25/956305/21/2019 0.5  0.4 - 0.6 g/dL Final   Beta 2 87/56/433205/21/2019 0.5  0.2 - 0.5 g/dL Final   Gamma Globulin 02/25/2018 1.3  0.8 - 1.7 g/dL Final   SPE Interp. 95/18/841605/21/2019    Final   Comment: . Alpha-1 globulin increase noted. .    Immunoglobulin A 02/25/2018 276  81 - 463 mg/dL Final   IgG (Immunoglobin G), Serum 02/25/2018 1,278  694 - 1,618 mg/dL Final   IgM, Serum 60/63/016005/21/2019 256  48 - 271 mg/dL Final   ANCA Screen 10/93/235505/21/2019 NEGATIVE  NEGATIVE Final   Comment: ANCA Screen includes evaluation for p-ANCA, c-ANCA and atypical p-ANCA. A positive ANCA screen reflexes to titer and pattern(s), e.g., cytoplasmic pattern (c-ANCA), perinuclear pattern (p-ANCA), or atypical p-ANCA pattern.  c-ANCA and p-ANCA are observed in vasculitis, whereas atypical p-ANCA is observed in IBD (Inflammatory Bowel Disease). Atypical p-ANCA is  detected in about 55% to 80% of patients with ulcerative colitis but only 5% to 25% of patients with Crohn's disease. .    Myeloperoxidase Abs 02/25/2018 <1.0  AI Final   Comment:      Value        Interpretation      -----        --------------      <1.0         No Antibody Detected      > or = 1.0   Antibody Detected . Autoantibodies to myeloperoxidase (MPO) are commonly associated with the following small-vessel vasculitides: microscopic polyangiitis, polyarteritis nodosa, Churg-Strauss syndrome, necrotizing and crescentic glomerulonephritis and occasionally granulomatosis with polyangiitis (GPA, Wegener's). The perinuclear IFA pattern, (p-ANCA) is based largely on autoantibody to  myeloperoxidase which serves as the primary antigen. These autoantibodies are present in active disease. .    Serine Protease 3 02/25/2018 <1.0  AI Final   Comment:      Value        Interpretation      -----        --------------      <1.0         No Antibody Detected      > or = 1.0   Antibody Detected . Autoantibodies to proteinase-3  (PR-3) are accepted as characteristic for granulomatosis with polyangiitis (GPA, Wegener's), and are detectable in 95% of the histologically proven cases. The cytoplasmic IFA pattern, (c-ANCA), is based largely on autoantibody to PR-3 which serves as the primary antigen. These autoantibodies are present in active disease. Marland Kitchen.    Rhuematoid fact SerPl-aCnc 02/25/2018 <14  <14 IU/mL Final   03/08/16 Patient saw the rheumatologist. I have not received his communication back yet but per the patient's report, the rheumatologist did not feel this is an autoimmune process. She continues to report diffuse muscle weakness and now diffuse  muscle pains he continues to have neuropathic paresthesias all over her body. She continues to be unable to open her jaw/mouth due to severe pain in her TMJ area and muscle tightness which is subjective in her masseter muscles. She continues to demonstrate a very flat affect. She is here today with her mother who does the majority of the talking. Patient will barely respond. She has taken to writing her answers down on a notepad rather than speak because of the pain in her TMJ joints.  She continues to lose weight due to her inability to open her mouth and she also reports trouble swallowing with pain in her upper throat. At that time, my plan was: So far the only abnormality I can find is a slightly elevated sedimentation rate. I suggested trying a course of prednisone to see if her muscle weakness and muscle pains would improve suggesting an autoimmune process and possibly getting a second opinion with a different rheumatologist. The patient refuses to take prednisone. Therefore I will start her on diclofenac 75 mg by mouth twice a day in addition to Flexeril 10 mg every 8 hours as needed for muscle spasms. I will arrange a second opinion with a neurologist at [facility]. I will also consult ENT given her severe TMJ pain and her sore throat. My clinical impression is that  the patient may have psychosomatic complaints however I want to exhaust all possibilities prior to treating her for this.  03/16/16 Since the last time I saw the patient, she has tried a prednisone taper pack with no improvement in her symptoms. She is here today with her mother. She refuses to talk. She refuses to open her mouth. She refuses to even allow me to touch the TMJ joints bilaterally. She has a flat affect and is noncommunicative. Her demeanor is extremely abnormal. Even with maximal coaching and urging the patient will barely open her mouth and speak but she continues to insist on writing everything down or not answering my questions at all. Her primary complaint at the present time is severe pain in both TMJ joints that radiates onto her forehead bilaterally down her jaw bilaterally towards her mouth. She also complains of pain and pressure radiating up her scalp into the parietal lobe bilaterally. However most of his communication is limited to hand signals and her mother's interpretation of cryptic notes. Continues to lose weight. She is yet to see the neurologist at [facility]. She has yet to see the ENT doctor. Again she refuses to allow me to perform even a rudimentary exam of the oral cavity due to the "pain" she just shakes her head no when I asked her to open her mouth and refuses to allow me to try to passively open her mouth.  At that time, my plan was: Very difficult case. I am limited in my ability to help her based on my limited knowledge of her past medical history and my previous experience with the patient. My gut instinct is that this is psychosomatic. Her neurologist had recommended Cymbalta but the patient never tried the medication. She has never tried the Flexeril that I gave her last time to try to relax her muscles. She did try the prednisone it was completely ineffective per her mother's report. Prior to contacting psychiatry, I will arrange an ENT consultation for laryngoscopy given  her odynophagia and dysphagia and pain in her neck. Hopefully they can perform this through her nostril fiberoptically and she refuses any examination that I performed.  I'll also perform a CT scan of the neck to rule out any physical lesion that could possibly be causing her pain. I will also try her empirically on Tegretol 100 mg by mouth twice a day for possible trigeminal neuralgia as this is the only other physical abnormality I could think that could cause the type of exquisite pain the patient is having around her face. However as I explained to the patient and her family, if the ENT consultation reveals no abnormality, if the CT scan reveals no abnormality, if the Tegretol provides no relief, I really think we need to start looking at this is psychosomatic complaints and possibly consult psychiatry. This is my gut instinct based on the patient's affect and demeanor  07/31/16 Shortly after our last visit, the patient started to improve. The muscle weakness and muscle soreness gradually resolved. Even the patient admitted that she felt it was due to her anxiety. We've had several visits since that time the patient has been very polite and pleasant. However she presents today with her mother and states that her symptoms are starting to return. She complains of muscle tightness and soreness and stiffness in her masseter muscles bilaterally in her shoulders and in her neck. She also complains of abdominal pain and abdominal cramping. Independently, she saw a neurologist at [facility] who again performed EMG and nerve conduction studies which came back normal according to the patient. I have no written records from [facility]. They have her scheduled for an MRI of the brain. I spent approximately 30 minutes with the patient today answering her questions.  At that time, my plan was:  12/04/16 Symptoms are starting to reoccur. She reports muscle tension radiating from the lower aspect of her neck up her occiput upper  scalp to her forehead. This occurs on a daily basis. She also reports muscle tension and muscle tightness in her jaw and in the lateral sides of her neck. She is extremely anxious about the symptoms returning as severe as they were previously. She also wants recheck her potassium. She was recheck her vitamin D. She was to be screened for HIV.  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  Begin Valium 5-10 mg by mouth every 12 hours when necessary muscle spasms and muscle tension. Consult psychologist for counseling. Patient needs to learn cognitive behavioral techniques to help manage her anxiety. I will check a vitamin D level and HIV screen and a potassium level in accordance with the patient's wishes. The remainder of her exam is normal  11/07/17 Patient is a 23 year old who presents again today complaining of diffuse muscle pain.  I asked her to be more specific and upon further questioning she states that all the muscles in her body cause her pain even down to the small muscles in her hand.  She complains of pain in her shoulders and her head and her neck and her back and her abdomen.  However she admits that she does believe anxiety is likely a culprit.  Which she becomes anxious, she admits that she can feel the muscles drawing into a tight spasm.  In the past she has seen benefit with Valium.  I am concerned by her weight gain since the last time I saw her.  She states that she is not eating excessively.  She is relatively sedentary due to the muscle pain.  She did see a psychologist but she saw a little benefit and what they  recommended.  She did not see benefit from the cognitive behavioral strategies that they try to teach her.  She is here today to discuss options to try to help manage the muscle pain as well as the anxiety.  At that time, my plan was: Given the change in her weight, I do believe lab work is in order.  I would like to check her  fasting blood sugar to rule out diabetes.  She does have a history of low potassium so would also like to check her electrolytes.  She also carries a history of vitamin D deficiency and I believe we should check her vitamin D as well.  If her labs are normal, I would treat the patient as generalized anxiety disorder, somatization disorder, fibromyalgia syndrome.  I believe Cymbalta 60 mg a day would help manage all of these issues simultaneously.  I explained to the patient my rationale in the medication.  I believe it can help control anxiety as well as help muscle pain.  Furthermore it is not habit-forming.  I believe this will be the best long-term option for a medication perspective.  It is my personal perspective that I believe the majority of this is an anxiety and identified to prevent and control the anxiety the muscle pain will improve as well.  Patient is in agreement to check the lab work.  She seems hesitant to start the medication.  Will await the results of lab work and then move forward from that point on. 04/30/19 After the last visit, patient saw a rheumatology.  Sed rate had fallen to 40.  Rheumatologist wanted to treat the patient's vitamin D deficiency prior to trying any other medication.  Patient never followed up with rheumatology.  She has been seeing a rheumatologist at [facility].  That rheumatologist recommended exercise, and vitamin B12.  She does not recollect any specific diagnosis that they gave her although it sounds like they were treating myofascial pain syndrome.  She presents today again complaining of diffuse muscle pain.  There is no 1 specific spot.  Instead, the patient states that she hurts all over her body more wherever she uses the muscles more often.  Majority of her pain is in her jaw or in her head.  She reports a tension-like headache in the crown of her head.  She reports TMJ-like pain.  She also gets pains in her shoulders and her legs.  She is been trying to exercise  more and lose weight.  She denies any fevers or chills or weight loss.  Past Medical History:  Diagnosis Date   Anxiety    Gastritis    GERD (gastroesophageal reflux disease)    Muscle weakness    Seasonal allergies    Past Surgical History:  Procedure Laterality Date   NO PAST SURGERIES     Current Outpatient Medications on File Prior to Visit  Medication Sig Dispense Refill   clindamycin (CLEOCIN) 150 MG capsule      esomeprazole (NEXIUM) 40 MG capsule take 1 capsule by mouth once daily 20 MINUTES BEFORE BREAKFAST 30 capsule 12   ibuprofen (ADVIL) 800 MG tablet Take 800 mg by mouth every 6 (six) hours as needed.      [DISCONTINUED] hyoscyamine (LEVSIN SL) 0.125 MG SL tablet Place 1 tablet (0.125 mg total) under the tongue every 8 (eight) hours as needed for cramping. (Patient not taking: Reported on 01/04/2016) 60 tablet 2   [DISCONTINUED] omeprazole (PRILOSEC) 20 MG capsule Take 1 capsule (20 mg  total) by mouth 2 (two) times daily before a meal. (Patient not taking: Reported on 01/04/2016) 30 capsule 0   [DISCONTINUED] ranitidine (ZANTAC) 150 MG tablet Take 1 tablet (150 mg total) by mouth 2 (two) times daily. (Patient not taking: Reported on 01/04/2016) 60 tablet 2   [DISCONTINUED] sucralfate (CARAFATE) 1 g tablet Take 1 tablet (1 g total) by mouth 4 (four) times daily -  with meals and at bedtime. (Patient not taking: Reported on 01/04/2016) 30 tablet 0   No current facility-administered medications on file prior to visit.    Allergies  Allergen Reactions   Sulfa Antibiotics Shortness Of Breath and Other (See Comments)    tremors   Tramadol Other (See Comments)    headaches   Social History   Socioeconomic History   Marital status: Single    Spouse name: Not on file   Number of children: 0   Years of education: 11   Highest education level: Not on file  Occupational History   Occupation: Magazine features editortudent  Social Needs   Financial resource strain: Not on file     Food insecurity    Worry: Not on file    Inability: Not on file   Transportation needs    Medical: Not on file    Non-medical: Not on file  Tobacco Use   Smoking status: Never Smoker   Smokeless tobacco: Never Used  Substance and Sexual Activity   Alcohol use: No   Drug use: No   Sexual activity: Yes    Partners: Male  Lifestyle   Physical activity    Days per week: Not on file    Minutes per session: Not on file   Stress: Not on file  Relationships   Social connections    Talks on phone: Not on file    Gets together: Not on file    Attends religious service: Not on file    Active member of club or organization: Not on file    Attends meetings of clubs or organizations: Not on file    Relationship status: Not on file   Intimate partner violence    Fear of current or ex partner: Not on file    Emotionally abused: Not on file    Physically abused: Not on file    Forced sexual activity: Not on file  Other Topics Concern   Not on file  Social History Narrative   Lives at home with mother and father.   Right-handed.   No caffeine use.   Family History  Problem Relation Age of Onset   Clotting disorder Maternal Grandfather    Diabetes Maternal Grandfather    Heart disease Maternal Grandfather    Breast cancer Paternal Grandmother    Diabetes Mother    Other Mother        gallstones   Hypertension Mother    Heart disease Mother    Hyperlipidemia Father    Other Sister        pre-diabetes   Other Sister        gallstones      Review of Systems  HENT: Positive for mouth sores.   All other systems reviewed and are negative.      Objective:   Physical Exam  Constitutional: She appears well-developed and well-nourished. No distress.  HENT:  Right Ear: External ear normal.  Left Ear: External ear normal.  Nose: Nose normal.  Cardiovascular: Normal rate and regular rhythm.  Pulmonary/Chest: Effort normal and breath sounds normal. No  respiratory distress. She has no wheezes. She has no rales. She exhibits no tenderness.  Skin: She is not diaphoretic.  Psychiatric: Cognition and memory are normal.  Vitals reviewed.        Assessment & Plan:  The primary encounter diagnosis was Muscle weakness. Diagnoses of Vitamin D deficiency, Myalgia, Positive ANA (antinuclear antibody), and Elevated sed rate were also pertinent to this visit. I believe the majority of her symptoms despite her lab abnormalities stemming from myofascial pain syndrome coupled with anxiety.  I have recommended repeating her lab work particularly her sed rate, ANA, and vitamin D levels.  If labs are unremarkable, I would recommend trying to treat the patient empirically with Lyrica to try to better manage her muscle pain.

## 2019-05-04 LAB — CBC WITH DIFFERENTIAL/PLATELET
Absolute Monocytes: 318 cells/uL (ref 200–950)
Basophils Absolute: 22 cells/uL (ref 0–200)
Basophils Relative: 0.5 %
Eosinophils Absolute: 60 cells/uL (ref 15–500)
Eosinophils Relative: 1.4 %
HCT: 38.5 % (ref 35.0–45.0)
Hemoglobin: 12.4 g/dL (ref 11.7–15.5)
Lymphs Abs: 1170 cells/uL (ref 850–3900)
MCH: 25 pg — ABNORMAL LOW (ref 27.0–33.0)
MCHC: 32.2 g/dL (ref 32.0–36.0)
MCV: 77.6 fL — ABNORMAL LOW (ref 80.0–100.0)
MPV: 10.6 fL (ref 7.5–12.5)
Monocytes Relative: 7.4 %
Neutro Abs: 2731 cells/uL (ref 1500–7800)
Neutrophils Relative %: 63.5 %
Platelets: 370 10*3/uL (ref 140–400)
RBC: 4.96 10*6/uL (ref 3.80–5.10)
RDW: 15.8 % — ABNORMAL HIGH (ref 11.0–15.0)
Total Lymphocyte: 27.2 %
WBC: 4.3 10*3/uL (ref 3.8–10.8)

## 2019-05-04 LAB — COMPLETE METABOLIC PANEL WITH GFR
AG Ratio: 1.4 (calc) (ref 1.0–2.5)
ALT: 10 U/L (ref 6–29)
AST: 12 U/L (ref 10–30)
Albumin: 4.2 g/dL (ref 3.6–5.1)
Alkaline phosphatase (APISO): 71 U/L (ref 31–125)
BUN/Creatinine Ratio: 8 (calc) (ref 6–22)
BUN: 5 mg/dL — ABNORMAL LOW (ref 7–25)
CO2: 20 mmol/L (ref 20–32)
Calcium: 9.4 mg/dL (ref 8.6–10.2)
Chloride: 105 mmol/L (ref 98–110)
Creat: 0.66 mg/dL (ref 0.50–1.10)
GFR, Est African American: 142 mL/min/{1.73_m2} (ref >=60)
GFR, Est Non African American: 123 mL/min/{1.73_m2} (ref >=60)
Globulin: 3 g/dL (calc) (ref 1.9–3.7)
Glucose, Bld: 93 mg/dL (ref 65–99)
Potassium: 4.1 mmol/L (ref 3.5–5.3)
Sodium: 137 mmol/L (ref 135–146)
Total Bilirubin: 0.3 mg/dL (ref 0.2–1.2)
Total Protein: 7.2 g/dL (ref 6.1–8.1)

## 2019-05-04 LAB — VITAMIN D 25 HYDROXY (VIT D DEFICIENCY, FRACTURES): Vit D, 25-Hydroxy: 24 ng/mL — ABNORMAL LOW (ref 30–100)

## 2019-05-04 LAB — ANTI-NUCLEAR AB-TITER (ANA TITER): ANA Titer 1: 1:80 {titer} — ABNORMAL HIGH

## 2019-05-04 LAB — SEDIMENTATION RATE: Sed Rate: 33 mm/h — ABNORMAL HIGH (ref 0–20)

## 2019-05-04 LAB — ANA: Anti Nuclear Antibody (ANA): POSITIVE — AB

## 2019-05-04 MED ORDER — VITAMIN D (ERGOCALCIFEROL) 1.25 MG (50000 UNIT) PO CAPS: 50000.0000 [IU] | ORAL_CAPSULE | ORAL | 1 refills | Status: DC

## 2019-05-07 ENCOUNTER — Other Ambulatory Visit: Payer: Self-pay | Admitting: Family Medicine

## 2019-05-07 MED ORDER — DIAZEPAM 5 MG PO TABS: 5.0000 mg | ORAL_TABLET | Freq: Three times a day (TID) | ORAL | 0 refills | Status: DC | PRN

## 2019-05-07 NOTE — Telephone Encounter (Signed)
Valium is addictive so she would need to use it sparingly.  30 per month. Valium 5 mg poq 8 hrs prn muscle spasms I will send in.

## 2019-07-13 ENCOUNTER — Encounter: Payer: Self-pay | Admitting: Family Medicine

## 2019-12-02 ENCOUNTER — Other Ambulatory Visit: Payer: Self-pay | Admitting: Family Medicine

## 2019-12-02 MED ORDER — VITAMIN D (ERGOCALCIFEROL) 1.25 MG (50000 UNIT) PO CAPS: 50000.0000 [IU] | ORAL_CAPSULE | ORAL | 1 refills | Status: DC

## 2020-02-05 ENCOUNTER — Other Ambulatory Visit: Payer: Self-pay | Admitting: Adult Health

## 2020-02-05 ENCOUNTER — Other Ambulatory Visit: Payer: Self-pay

## 2020-02-05 ENCOUNTER — Ambulatory Visit (INDEPENDENT_AMBULATORY_CARE_PROVIDER_SITE_OTHER): Payer: Medicaid Other | Admitting: Adult Health

## 2020-02-05 ENCOUNTER — Encounter: Payer: Self-pay | Admitting: Adult Health

## 2020-02-05 VITALS — BP 126/83 | HR 109 | Ht 65.0 in | Wt 233.0 lb

## 2020-02-05 DIAGNOSIS — N926 Irregular menstruation, unspecified: Secondary | ICD-10-CM | POA: Insufficient documentation

## 2020-02-05 DIAGNOSIS — Z363 Encounter for antenatal screening for malformations: Secondary | ICD-10-CM | POA: Insufficient documentation

## 2020-02-05 DIAGNOSIS — Z3201 Encounter for pregnancy test, result positive: Secondary | ICD-10-CM

## 2020-02-05 DIAGNOSIS — F419 Anxiety disorder, unspecified: Secondary | ICD-10-CM

## 2020-02-05 DIAGNOSIS — Z3A28 28 weeks gestation of pregnancy: Secondary | ICD-10-CM

## 2020-02-05 DIAGNOSIS — Z113 Encounter for screening for infections with a predominantly sexual mode of transmission: Secondary | ICD-10-CM | POA: Insufficient documentation

## 2020-02-05 LAB — POCT URINE PREGNANCY: Preg Test, Ur: POSITIVE — AB

## 2020-02-05 MED ORDER — HYDROXYZINE HCL 10 MG PO TABS: 10.0000 mg | ORAL_TABLET | Freq: Three times a day (TID) | ORAL | 1 refills | Status: DC | PRN

## 2020-02-05 NOTE — Progress Notes (Signed)
  Subjective:     Patient ID: Bethany Barrera, female   DOB: 1996-07-21, 24 y.o.   MRN: [id number]  HPI Bethany Barrera is a 24 year old black female, single, in for UPT, has missed periods and had +HPT 2 weeks go, has felt flutters. PCP is Dr Tanya Nones.  Review of Systems +missed periods +anxiety  Hx irregular periods Reviewed past medical,surgical, social and family history. Reviewed medications and allergies.     Objective:   Physical Exam BP 126/83 (BP Location: Left Arm, Patient Position: Sitting, Cuff Size: Normal)   Pulse (!) 109   Ht 5\' 5"  (1.651 m)   Wt 233 lb (105.7 kg)   LMP 07/24/2019 (Within Days)   BMI 38.77 kg/m UPT +, about 28 weeks by LMP with EDD 04/29/20.Skin warm and dry. Neck: mid line trachea, normal thyroid, good ROM, no lymphadenopathy noted. Lungs: clear to ausculation bilaterally. Cardiovascular: regular rate and rhythm.Abdomen is soft and non tender, FH about 25 cm. POC 05/01/20 showed +FM, and FHM  AA is 0 Fall risk is low PHQ 9 score is 3, no SI     Assessment:     1. Missed period UPT + Take PNV  2. Screen for STD (sexually transmitted disease) GC/CHL sent on urine   3. [redacted] weeks gestation of pregnancy  4. Antenatal screening for malformation using ultrasonics anatomy US ASAP  5. Anxiety Do not take valium, will rx vistaril  Meds ordered this encounter  Medications  . hydrOXYzine (ATARAX/VISTARIL) 10 MG tablet    Sig: Take 1 tablet (10 mg total) by mouth 3 (three) times daily as needed.    Dispense:  30 tablet    Refill:  1    Order Specific Question:   Supervising Provider    Answer:   [place] [[id number]]      Plan:     Review handouts on second and third trimester and by [facility]

## 2020-02-05 NOTE — Patient Instructions (Signed)
Third Trimester of Pregnancy  The third trimester is from week 28 through week 40 (months 7 through 9). This trimester is when your unborn baby (fetus) is growing very fast. At the end of the ninth month, the unborn baby is about 20 inches in length. It weighs about 6-10 pounds. Follow these instructions at home: Medicines  Take over-the-counter and prescription medicines only as told by your doctor. Some medicines are safe and some medicines are not safe during pregnancy.  Take a prenatal vitamin that contains at least 600 micrograms (mcg) of folic acid.  If you have trouble pooping (constipation), take medicine that will make your stool soft (stool softener) if your doctor approves. Eating and drinking   Eat regular, healthy meals.  Avoid raw meat and uncooked cheese.  If you get low calcium from the food you eat, talk to your doctor about taking a daily calcium supplement.  Eat four or five small meals rather than three large meals a day.  Avoid foods that are high in fat and sugars, such as fried and sweet foods.  To prevent constipation: ? Eat foods that are high in fiber, like fresh fruits and vegetables, whole grains, and beans. ? Drink enough fluids to keep your pee (urine) clear or pale yellow. Activity  Exercise only as told by your doctor. Stop exercising if you start to have cramps.  Avoid heavy lifting, wear low heels, and sit up straight.  Do not exercise if it is too hot, too humid, or if you are in a place of great height (high altitude).  You may continue to have sex unless your doctor tells you not to. Relieving pain and discomfort  Wear a good support bra if your breasts are tender.  Take frequent breaks and rest with your legs raised if you have leg cramps or low back pain.  Take warm water baths (sitz baths) to soothe pain or discomfort caused by hemorrhoids. Use hemorrhoid cream if your doctor approves.  If you develop puffy, bulging veins (varicose  veins) in your legs: ? Wear support hose or compression stockings as told by your doctor. ? Raise (elevate) your feet for 15 minutes, 3-4 times a day. ? Limit salt in your food. Safety  Wear your seat belt when driving.  Make a list of emergency phone numbers, including numbers for family, friends, the hospital, and police and fire departments. Preparing for your baby's arrival To prepare for the arrival of your baby:  Take prenatal classes.  Practice driving to the hospital.  Visit the hospital and tour the maternity area.  Talk to your work about taking leave once the baby comes.  Pack your hospital bag.  Prepare the baby's room.  Go to your doctor visits.  Buy a rear-facing car seat. Learn how to install it in your car. General instructions  Do not use hot tubs, steam rooms, or saunas.  Do not use any products that contain nicotine or tobacco, such as cigarettes and e-cigarettes. If you need help quitting, ask your doctor.  Do not drink alcohol.  Do not douche or use tampons or scented sanitary pads.  Do not cross your legs for long periods of time.  Do not travel for long distances unless you must. Only do so if your doctor says it is okay.  Visit your dentist if you have not gone during your pregnancy. Use a soft toothbrush to brush your teeth. Be gentle when you floss.  Avoid cat litter boxes and soil   used by cats. These carry germs that can cause birth defects in the baby and can cause a loss of your baby (miscarriage) or stillbirth.  Keep all your prenatal visits as told by your doctor. This is important. Contact a doctor if:  You are not sure if you are in labor or if your water has broken.  You are dizzy.  You have mild cramps or pressure in your lower belly.  You have a nagging pain in your belly area.  You continue to feel sick to your stomach, you throw up, or you have watery poop.  You have bad smelling fluid coming from your vagina.  You have  pain when you pee. Get help right away if:  You have a fever.  You are leaking fluid from your vagina.  You are spotting or bleeding from your vagina.  You have severe belly cramps or pain.  You lose or gain weight quickly.  You have trouble catching your breath and have chest pain.  You notice sudden or extreme puffiness (swelling) of your face, hands, ankles, feet, or legs.  You have not felt the baby move in over an hour.  You have severe headaches that do not go away with medicine.  You have trouble seeing.  You are leaking, or you are having a gush of fluid, from your vagina before you are 37 weeks.  You have regular belly spasms (contractions) before you are 37 weeks. Summary  The third trimester is from week 28 through week 40 (months 7 through 9). This time is when your unborn baby is growing very fast.  Follow your doctor's advice about medicine, food, and activity.  Get ready for the arrival of your baby by taking prenatal classes, getting all the baby items ready, preparing the baby's room, and visiting your doctor to be checked.  Get help right away if you are bleeding from your vagina, or you have chest pain and trouble catching your breath, or if you have not felt your baby move in over an hour. This information is not intended to replace advice given to you by your health care provider. Make sure you discuss any questions you have with your health care provider. Document Revised: 01/15/2019 Document Reviewed: 10/30/2016 Elsevier Patient Education  2020 [employer]. Second Trimester of Pregnancy  The second trimester is from week 14 through week 27 (month 4 through 6). This is often the time in pregnancy that you feel your best. Often times, morning sickness has lessened or quit. You may have more energy, and you may get hungry more often. Your unborn baby is growing rapidly. At the end of the sixth month, he or she is about 9 inches long and weighs about 1  pounds. You will likely feel the baby move between 18 and 20 weeks of pregnancy. Follow these instructions at home: Medicines  Take over-the-counter and prescription medicines only as told by your doctor. Some medicines are safe and some medicines are not safe during pregnancy.  Take a prenatal vitamin that contains at least 600 micrograms (mcg) of folic acid.  If you have trouble pooping (constipation), take medicine that will make your stool soft (stool softener) if your doctor approves. Eating and drinking   Eat regular, healthy meals.  Avoid raw meat and uncooked cheese.  If you get low calcium from the food you eat, talk to your doctor about taking a daily calcium supplement.  Avoid foods that are high in fat and sugars, such as  fried and sweet foods.  If you feel sick to your stomach (nauseous) or throw up (vomit): ? Eat 4 or 5 small meals a day instead of 3 large meals. ? Try eating a few soda crackers. ? Drink liquids between meals instead of during meals.  To prevent constipation: ? Eat foods that are high in fiber, like fresh fruits and vegetables, whole grains, and beans. ? Drink enough fluids to keep your pee (urine) clear or pale yellow. Activity  Exercise only as told by your doctor. Stop exercising if you start to have cramps.  Do not exercise if it is too hot, too humid, or if you are in a place of great height (high altitude).  Avoid heavy lifting.  Wear low-heeled shoes. Sit and stand up straight.  You can continue to have sex unless your doctor tells you not to. Relieving pain and discomfort  Wear a good support bra if your breasts are tender.  Take warm water baths (sitz baths) to soothe pain or discomfort caused by hemorrhoids. Use hemorrhoid cream if your doctor approves.  Rest with your legs raised if you have leg cramps or low back pain.  If you develop puffy, bulging veins (varicose veins) in your legs: ? Wear support hose or compression  stockings as told by your doctor. ? Raise (elevate) your feet for 15 minutes, 3-4 times a day. ? Limit salt in your food. Prenatal care  Write down your questions. Take them to your prenatal visits.  Keep all your prenatal visits as told by your doctor. This is important. Safety  Wear your seat belt when driving.  Make a list of emergency phone numbers, including numbers for family, friends, the hospital, and police and fire departments. General instructions  Ask your doctor about the right foods to eat or for help finding a counselor, if you need these services.  Ask your doctor about local prenatal classes. Begin classes before month 6 of your pregnancy.  Do not use hot tubs, steam rooms, or saunas.  Do not douche or use tampons or scented sanitary pads.  Do not cross your legs for long periods of time.  Visit your dentist if you have not done so. Use a soft toothbrush to brush your teeth. Floss gently.  Avoid all smoking, herbs, and alcohol. Avoid drugs that are not approved by your doctor.  Do not use any products that contain nicotine or tobacco, such as cigarettes and e-cigarettes. If you need help quitting, ask your doctor.  Avoid cat litter boxes and soil used by cats. These carry germs that can cause birth defects in the baby and can cause a loss of your baby (miscarriage) or stillbirth. Contact a doctor if:  You have mild cramps or pressure in your lower belly.  You have pain when you pee (urinate).  You have bad smelling fluid coming from your vagina.  You continue to feel sick to your stomach (nauseous), throw up (vomit), or have watery poop (diarrhea).  You have a nagging pain in your belly area.  You feel dizzy. Get help right away if:  You have a fever.  You are leaking fluid from your vagina.  You have spotting or bleeding from your vagina.  You have severe belly cramping or pain.  You lose or gain weight rapidly.  You have trouble catching  your breath and have chest pain.  You notice sudden or extreme puffiness (swelling) of your face, hands, ankles, feet, or legs.  You have not  felt the baby move in over an hour.  You have severe headaches that do not go away when you take medicine.  You have trouble seeing. Summary  The second trimester is from week 14 through week 27 (months 4 through 6). This is often the time in pregnancy that you feel your best.  To take care of yourself and your unborn baby, you will need to eat healthy meals, take medicines only if your doctor tells you to do so, and do activities that are safe for you and your baby.  Call your doctor if you get sick or if you notice anything unusual about your pregnancy. Also, call your doctor if you need help with the right food to eat, or if you want to know what activities are safe for you. This information is not intended to replace advice given to you by your health care provider. Make sure you discuss any questions you have with your health care provider. Document Revised: 01/16/2019 Document Reviewed: 10/30/2016 Elsevier Patient Education  2020 [employer].

## 2020-02-07 LAB — GC/CHLAMYDIA PROBE AMP
Chlamydia trachomatis, NAA: NEGATIVE
Neisseria Gonorrhoeae by PCR: NEGATIVE

## 2020-02-11 ENCOUNTER — Ambulatory Visit (INDEPENDENT_AMBULATORY_CARE_PROVIDER_SITE_OTHER): Payer: Medicaid Other

## 2020-02-11 ENCOUNTER — Other Ambulatory Visit: Payer: Self-pay

## 2020-02-11 DIAGNOSIS — Z3A26 26 weeks gestation of pregnancy: Secondary | ICD-10-CM

## 2020-02-11 DIAGNOSIS — O321XX0 Maternal care for breech presentation, not applicable or unspecified: Secondary | ICD-10-CM

## 2020-02-11 DIAGNOSIS — Z363 Encounter for antenatal screening for malformations: Secondary | ICD-10-CM | POA: Diagnosis not present

## 2020-02-11 NOTE — Progress Notes (Signed)
[place] 26+5 wks,breech,cx 3 cm,normal ovaries,afi 13.7 cm,fhr 154 bpm,posterior placenta gr 1,efw 955 g,anatomy complete,no obvious abnormalities

## 2020-02-17 ENCOUNTER — Encounter: Payer: Medicaid Other | Admitting: Women's Health

## 2020-02-29 DIAGNOSIS — Z3400 Encounter for supervision of normal first pregnancy, unspecified trimester: Secondary | ICD-10-CM | POA: Insufficient documentation

## 2020-02-29 DIAGNOSIS — O0932 Supervision of pregnancy with insufficient antenatal care, second trimester: Secondary | ICD-10-CM | POA: Insufficient documentation

## 2020-03-02 ENCOUNTER — Encounter: Payer: Self-pay | Admitting: Women's Health

## 2020-03-02 ENCOUNTER — Ambulatory Visit (INDEPENDENT_AMBULATORY_CARE_PROVIDER_SITE_OTHER): Payer: Medicaid Other | Admitting: Women's Health

## 2020-03-02 ENCOUNTER — Other Ambulatory Visit (HOSPITAL_COMMUNITY)
Admission: RE | Admit: 2020-03-02 | Discharge: 2020-03-02 | Disposition: A | Discharge status: Home / Self Care | Payer: Medicaid Other | Source: Ambulatory Visit | Attending: Obstetrics and Gynecology | Admitting: Obstetrics and Gynecology

## 2020-03-02 VITALS — BP 123/75 | HR 112 | Wt 236.2 lb

## 2020-03-02 DIAGNOSIS — Z3A29 29 weeks gestation of pregnancy: Secondary | ICD-10-CM | POA: Insufficient documentation

## 2020-03-02 DIAGNOSIS — F419 Anxiety disorder, unspecified: Secondary | ICD-10-CM | POA: Diagnosis not present

## 2020-03-02 DIAGNOSIS — O99343 Other mental disorders complicating pregnancy, third trimester: Secondary | ICD-10-CM

## 2020-03-02 DIAGNOSIS — Z3402 Encounter for supervision of normal first pregnancy, second trimester: Secondary | ICD-10-CM

## 2020-03-02 DIAGNOSIS — O0932 Supervision of pregnancy with insufficient antenatal care, second trimester: Secondary | ICD-10-CM

## 2020-03-02 DIAGNOSIS — G8929 Other chronic pain: Secondary | ICD-10-CM | POA: Insufficient documentation

## 2020-03-02 DIAGNOSIS — Z8639 Personal history of other endocrine, nutritional and metabolic disease: Secondary | ICD-10-CM

## 2020-03-02 DIAGNOSIS — Z3403 Encounter for supervision of normal first pregnancy, third trimester: Secondary | ICD-10-CM

## 2020-03-02 LAB — POCT URINALYSIS DIPSTICK OB
Blood, UA: NEGATIVE
Glucose, UA: NEGATIVE
Ketones, UA: NEGATIVE
Nitrite, UA: NEGATIVE

## 2020-03-02 MED ORDER — BLOOD PRESSURE MONITOR MISC: 0 refills | Status: DC

## 2020-03-02 NOTE — Patient Instructions (Addendum)
Bethany Barrera, I greatly value your feedback.  If you receive a survey following your visit with [place] today, we appreciate you taking the time to fill it out.  Thanks, Knute Neu, CNM, WHNP-BC   [facility] [place] [facility] ([place], [place] [address]) Entrance C, located off of [address]   You will have your sugar test next visit.  Please do not eat or drink anything after midnight the night before you come, not even water.  You will be here for at least two hours.  Please make an appointment online for the bloodwork at [email or web address] for 8:30am (or as close to this as possible). Make sure you select the [facility] service center. The day of the appointment, check in with our office first, then you will go to [facility] to start the sugar test.    Go to [email or web address] to register for FREE online childbirth classes   Call the office [phone]) or go to [facility] if:  You begin to have strong, frequent contractions  Your water breaks.  Sometimes it is a big gush of fluid, sometimes it is just a trickle that keeps getting your panties wet or running down your legs  You have vaginal bleeding.  It is normal to have a small amount of spotting if your cervix was checked.   You don't feel your baby moving like normal.  If you don't, get you something to eat and drink and lay down and focus on feeling your baby move.  You should feel at least 10 movements in 2 hours.  If you don't, you should call the office or go to [facility].    Tdap Vaccine  It is recommended that you get the Tdap vaccine during the third trimester of EACH pregnancy to help protect your baby from getting pertussis (whooping cough)  27-36 weeks is the BEST time to do this so that you can pass the protection on to your baby. During pregnancy is better than after pregnancy, but if you are unable to get it during pregnancy it will be offered at the hospital.    You can get this vaccine with [place], at the health department, your family doctor, or some local pharmacies  Everyone who will be around your baby should also be up-to-date on their vaccines before the baby comes. Adults (who are not pregnant) only need 1 dose of Tdap during adulthood.   [place] Pediatricians/Family Doctors:  [facility] [phone]                 [place] [phone] (usually not accepting new patients unless you have family there already, you are always welcome to call and ask)       [facility] [phone]       [facility] Pediatricians/Family Doctors:   [facility]: [phone]  [facility]: [phone]  [facility]: [place] Doctors:   [facility]: [facility]: [place]:  [place]: [phone]   Home Blood Pressure Monitoring for Patients   Your provider has recommended that you check your blood pressure (BP) at least once a week at home. If you do not have a blood pressure cuff at home, one will be provided for you. Contact your provider if you have  not received your monitor within 1 week.   Helpful Tips for Accurate Home Blood Pressure Checks  . Don't smoke, exercise, or drink caffeine 30 minutes before checking your BP . Use the restroom before checking your BP (a full bladder can raise your pressure) . Relax in a comfortable upright chair . Feet on the ground . Left arm resting comfortably on a flat surface at the level of your heart . Legs uncrossed . Back supported . Sit quietly and don't talk . Place the cuff on your bare arm . Adjust snuggly, so that only two fingertips can fit between your skin and the top of the cuff . Check 2 readings separated by at least one minute . Keep  a log of your BP readings . For a visual, please reference this diagram: http://ccnc.care/bpdiagram  Provider Name: [facility]     Phone: [phone]  Zone 1: ALL CLEAR  Continue to monitor your symptoms:  . BP reading is less than 140 (top number) or less than 90 (bottom number)  . No right upper stomach pain . No headaches or seeing spots . No feeling nauseated or throwing up . No swelling in face and hands  Zone 2: CAUTION Call your doctor's office for any of the following:  . BP reading is greater than 140 (top number) or greater than 90 (bottom number)  . Stomach pain under your ribs in the middle or right side . Headaches or seeing spots . Feeling nauseated or throwing up . Swelling in face and hands  Zone 3: EMERGENCY  Seek immediate medical care if you have any of the following:  . BP reading is greater than160 (top number) or greater than 110 (bottom number) . Severe headaches not improving with Tylenol . Serious difficulty catching your breath . Any worsening symptoms from Zone 2   Third Trimester of Pregnancy The third trimester is from week 29 through week 42, months 7 through 9. The third trimester is a time when the fetus is growing rapidly. At the end of the ninth month, the fetus is about 20 inches in length and weighs 6-10 pounds.  BODY CHANGES Your body goes through many changes during pregnancy. The changes vary from woman to woman.   Your weight will continue to increase. You can expect to gain 25-35 pounds (11-16 kg) by the end of the pregnancy.  You may begin to get stretch marks on your hips, abdomen, and breasts.  You may urinate more often because the fetus is moving lower into your pelvis and pressing on your bladder.  You may develop or continue to have heartburn as a result of your pregnancy.  You may develop constipation because certain hormones are causing the muscles that push waste through your intestines to slow down.  You may  develop hemorrhoids or swollen, bulging veins (varicose veins).  You may have pelvic pain because of the weight gain and pregnancy hormones relaxing your joints between the bones in your pelvis. Backaches may result from overexertion of the muscles supporting your posture.  You may have changes in your hair. These can include thickening of your hair, rapid growth, and changes in texture. Some women also have hair loss during or after pregnancy, or hair that feels dry or thin. Your hair will most likely return to normal after your baby is born.  Your breasts will continue to grow and be tender. A yellow discharge may leak from your breasts called colostrum.  Your belly button may  stick out.  You may feel short of breath because of your expanding uterus.  You may notice the fetus "dropping," or moving lower in your abdomen.  You may have a bloody mucus discharge. This usually occurs a few days to a week before labor begins.  Your cervix becomes thin and soft (effaced) near your due date. WHAT TO EXPECT AT YOUR PRENATAL EXAMS  You will have prenatal exams every 2 weeks until week 36. Then, you will have weekly prenatal exams. During a routine prenatal visit:  You will be weighed to make sure you and the fetus are growing normally.  Your blood pressure is taken.  Your abdomen will be measured to track your baby's growth.  The fetal heartbeat will be listened to.  Any test results from the previous visit will be discussed.  You may have a cervical check near your due date to see if you have effaced. At around 36 weeks, your caregiver will check your cervix. At the same time, your caregiver will also perform a test on the secretions of the vaginal tissue. This test is to determine if a type of bacteria, Group B streptococcus, is present. Your caregiver will explain this further. Your caregiver may ask you:  What your birth plan is.  How you are feeling.  If you are feeling the baby  move.  If you have had any abnormal symptoms, such as leaking fluid, bleeding, severe headaches, or abdominal cramping.  If you have any questions. Other tests or screenings that may be performed during your third trimester include:  Blood tests that check for low iron levels (anemia).  Fetal testing to check the health, activity level, and growth of the fetus. Testing is done if you have certain medical conditions or if there are problems during the pregnancy. FALSE LABOR You may feel small, irregular contractions that eventually go away. These are called Braxton Hicks contractions, or false labor. Contractions may last for hours, days, or even weeks before true labor sets in. If contractions come at regular intervals, intensify, or become painful, it is best to be seen by your caregiver.  SIGNS OF LABOR   Menstrual-like cramps.  Contractions that are 5 minutes apart or less.  Contractions that start on the top of the uterus and spread down to the lower abdomen and back.  A sense of increased pelvic pressure or back pain.  A watery or bloody mucus discharge that comes from the vagina. If you have any of these signs before the 37th week of pregnancy, call your caregiver right away. You need to go to the hospital to get checked immediately. HOME CARE INSTRUCTIONS   Avoid all smoking, herbs, alcohol, and unprescribed drugs. These chemicals affect the formation and growth of the baby.  Follow your caregiver's instructions regarding medicine use. There are medicines that are either safe or unsafe to take during pregnancy.  Exercise only as directed by your caregiver. Experiencing uterine cramps is a good sign to stop exercising.  Continue to eat regular, healthy meals.  Wear a good support bra for breast tenderness.  Do not use hot tubs, steam rooms, or saunas.  Wear your seat belt at all times when driving.  Avoid raw meat, uncooked cheese, cat litter boxes, and soil used by  cats. These carry germs that can cause birth defects in the baby.  Take your prenatal vitamins.  Try taking a stool softener (if your caregiver approves) if you develop constipation. Eat more high-fiber foods, such as  fresh vegetables or fruit and whole grains. Drink plenty of fluids to keep your urine clear or pale yellow.  Take warm sitz baths to soothe any pain or discomfort caused by hemorrhoids. Use hemorrhoid cream if your caregiver approves.  If you develop varicose veins, wear support hose. Elevate your feet for 15 minutes, 3-4 times a day. Limit salt in your diet.  Avoid heavy lifting, wear low heal shoes, and practice good posture.  Rest a lot with your legs elevated if you have leg cramps or low back pain.  Visit your dentist if you have not gone during your pregnancy. Use a soft toothbrush to brush your teeth and be gentle when you floss.  A sexual relationship may be continued unless your caregiver directs you otherwise.  Do not travel far distances unless it is absolutely necessary and only with the approval of your caregiver.  Take prenatal classes to understand, practice, and ask questions about the labor and delivery.  Make a trial run to the hospital.  Pack your hospital bag.  Prepare the baby's nursery.  Continue to go to all your prenatal visits as directed by your caregiver. SEEK MEDICAL CARE IF:  You are unsure if you are in labor or if your water has broken.  You have dizziness.  You have mild pelvic cramps, pelvic pressure, or nagging pain in your abdominal area.  You have persistent nausea, vomiting, or diarrhea.  You have a bad smelling vaginal discharge.  You have pain with urination. SEEK IMMEDIATE MEDICAL CARE IF:   You have a fever.  You are leaking fluid from your vagina.  You have spotting or bleeding from your vagina.  You have severe abdominal cramping or pain.  You have rapid weight loss or gain.  You have shortness of breath  with chest pain.  You notice sudden or extreme swelling of your face, hands, ankles, feet, or legs.  You have not felt your baby move in over an hour.  You have severe headaches that do not go away with medicine.  You have vision changes. Document Released: 09/18/2001 Document Revised: 09/29/2013 Document Reviewed: 11/25/2012 [facility] Patient Information 2015 [place], [place]. This information is not intended to replace advice given to you by your health care provider. Make sure you discuss any questions you have with your health care provider.

## 2020-03-02 NOTE — Progress Notes (Signed)
INITIAL OBSTETRICAL VISIT Patient name: Bethany Barrera MRN [id number]  Date of birth: 05-27-1996 Chief Complaint:   Initial Prenatal Visit  History of Present Illness:   Bethany Barrera is a 24 y.o. G1P0 African American female at [redacted]w[redacted]d by 26wk u/s, with an Estimated Date of Delivery: 05/14/20 being seen today for her initial obstetrical visit.  She is late to care b/c she just learned of pregnancy about a month ago. Had irregular periods, so didn't think anything about not having a period until she noticed her belly growing.  Her obstetrical history is significant for primigravida.   Has long standing h/o chronic pain all over body, has seen PCP/rheumatology, latest labs were from 04/30/19: ANA antibody+, titer 1:80, sed rate 33, VitD 24, last went to rheumatology 04/22/19- they felt it was possible fibromyalgia/myofascial pain, states she was told it was not lupus. Today she reports continued chronic pain, wants to know if ok to take turmeric.  Depression screen [facility] 2/9 03/02/2020 02/05/2020 11/07/2017  Decreased Interest 1 1 1   Down, Depressed, Hopeless 0 0 1  PHQ - 2 Score 1 1 2   Altered sleeping 0 1 0  Tired, decreased energy 1 1 1   Change in appetite 0 0 0  Feeling bad or failure about yourself  0 0 0  Trouble concentrating 0 0 0  Moving slowly or fidgety/restless 0 0 0  Suicidal thoughts 0 0 0  PHQ-9 Score 2 3 3   Difficult doing work/chores Not difficult at all - Somewhat difficult    Patient's last menstrual period was 07/24/2019 (within days). Last pap >64yrs. Results were: normal Review of Systems:   Pertinent items are noted in HPI Denies cramping/contractions, leakage of fluid, vaginal bleeding, abnormal vaginal discharge w/ itching/odor/irritation, headaches, visual changes, shortness of breath, chest pain, abdominal pain, severe nausea/vomiting, or problems with urination or bowel movements unless otherwise stated above.  Pertinent History Reviewed:  Reviewed past  medical,surgical, social, obstetrical and family history.  Reviewed problem list, medications and allergies. OB History  Gravida Para Term Preterm AB Living  1            SAB TAB Ectopic Multiple Live Births               # Outcome Date GA Lbr Len/2nd Weight Sex Delivery Anes PTL Lv  1 Current            Physical Assessment:   Vitals:   03/02/20 1027  BP: 123/75  Pulse: (!) 112  Weight: 236 lb 3.2 oz (107.1 kg)  Body mass index is 39.31 kg/m.       Physical Examination:  General appearance - well appearing, and in no distress  Mental status - alert, oriented to person, place, and time  Psych:  She has a normal mood and affect  Skin - warm and dry, normal color, no suspicious lesions noted  Chest - effort normal, all lung fields clear to auscultation bilaterally  Heart - normal rate and regular rhythm  Abdomen - soft, nontender  Extremities:  No swelling or varicosities noted  Pelvic - VULVA: normal appearing vulva with no masses, tenderness or lesions  VAGINA: normal appearing vagina with normal color and discharge, no lesions  CERVIX: normal appearing cervix without discharge or lesions, no CMT  Thin prep pap is done w/ HR HPV cotesting  TODAY'S FHR: 150  Results for orders placed or performed in visit on 03/02/20 (from the past 24 hour(s))  POC Urinalysis Dipstick OB  Collection Time: 03/02/20 11:04 AM  Result Value Ref Range   Color, UA     Clarity, UA     Glucose, UA Negative Negative   Bilirubin, UA     Ketones, UA neg    Spec Grav, UA     Blood, UA neg    pH, UA     POC,PROTEIN,UA Small (1+) Negative, Trace, Small (1+), Moderate (2+), Large (3+), 4+   Urobilinogen, UA     Nitrite, UA neg    Leukocytes, UA Small (1+) (A) Negative   Appearance     Odor      Assessment & Plan:  1) Low-Risk Pregnancy G1P0 at [redacted]w[redacted]d with an Estimated Date of Delivery: 05/14/20   2) Initial OB visit  3) Chronic pain w/ +ANA antibodies> has seen rheumatology, they feel may be  fibromyalgia/myofascial pain, do not feel it is autoimmune process/lupus per chart and pt  4) Anxiety> taking vistaril prn  Meds:  Meds ordered this encounter  Medications  . Blood Pressure Monitor MISC    Sig: For regular home bp monitoring during pregnancy    Dispense:  1 each    Refill:  0    Z34.00    Initial labs obtained Continue prenatal vitamins Reviewed n/v relief measures and warning s/s to report Reviewed recommended weight gain based on pre-gravid BMI Encouraged well-balanced diet Genetic & carrier screening discussed: requests Panorama and Horizon 14 , too late for NT/IT/AFP Ultrasound discussed; fetal survey: results reviewed CCNC completed> form faxed if has or is planning to apply for medicaid The nature of [facility] [employer] with multiple MDs and other Advanced Practice Providers was explained to patient; also emphasized that fellows, residents, and students are part of our team. Does not home bp cuff. Rx faxed to [facility]. Check bp weekly, let us know if >140/90.   Indications for ASA therapy (per uptodate)  OR Two or more of the following: Nulliparity Yes Obesity (BMI>30 kg/m2) Yes  Sociodemographic characteristics (African American race, low socioeconomic level) Yes  But too late @ 29wks to initiate to be of benefit  Follow-up: Return for asap sugar test (no visit), then Cameron Sprang w/ MD or CNM.   Orders Placed This Encounter  Procedures  . Urine Culture  . CBC/D/Plt+RPR+Rh+ABO+Rub Ab...  . Pain Management Screening Profile (10S)  . Genetic Screening  . VITAMIN D 25 Hydroxy (Vit-D Deficiency, Fractures)  . POC Urinalysis Dipstick OB    Cheral Marker CNM, [facility] 03/02/2020 5:14 PM

## 2020-03-03 LAB — CBC/D/PLT+RPR+RH+ABO+RUB AB...
Antibody Screen: NEGATIVE
Basophils Absolute: 0 10*3/uL (ref 0.0–0.2)
Basos: 0 %
EOS (ABSOLUTE): 0.1 10*3/uL (ref 0.0–0.4)
Eos: 1 %
HCV Ab: <0.1 s/co ratio (ref 0.0–0.9)
HIV Screen 4th Generation wRfx: NONREACTIVE
Hematocrit: 35.4 % (ref 34.0–46.6)
Hemoglobin: 11.9 g/dL (ref 11.1–15.9)
Hepatitis B Surface Ag: NEGATIVE
Immature Grans (Abs): 0 10*3/uL (ref 0.0–0.1)
Immature Granulocytes: 0 %
Lymphocytes Absolute: 2 10*3/uL (ref 0.7–3.1)
Lymphs: 22 %
MCH: 26.6 pg (ref 26.6–33.0)
MCHC: 33.6 g/dL (ref 31.5–35.7)
MCV: 79 fL (ref 79–97)
Monocytes Absolute: 0.4 10*3/uL (ref 0.1–0.9)
Monocytes: 5 %
Neutrophils Absolute: 6.8 10*3/uL (ref 1.4–7.0)
Neutrophils: 72 %
Platelets: 281 10*3/uL (ref 150–450)
RBC: 4.47 x10E6/uL (ref 3.77–5.28)
RDW: 13.8 % (ref 11.7–15.4)
RPR Ser Ql: NONREACTIVE
Rh Factor: POSITIVE
Rubella Antibodies, IGG: 3.32 index (ref >0.99)
WBC: 9.4 10*3/uL (ref 3.4–10.8)

## 2020-03-03 LAB — VITAMIN D 25 HYDROXY (VIT D DEFICIENCY, FRACTURES): Vit D, 25-Hydroxy: 38.4 ng/mL (ref 30.0–100.0)

## 2020-03-03 LAB — HCV INTERPRETATION

## 2020-03-04 LAB — URINE CULTURE

## 2020-03-08 NOTE — Addendum Note (Signed)
Addended by: Annamarie Dawley on: 03/08/2020 04:51 PM   Modules accepted: Orders

## 2020-03-09 ENCOUNTER — Other Ambulatory Visit: Payer: Medicaid Other

## 2020-03-09 DIAGNOSIS — Z3A30 30 weeks gestation of pregnancy: Secondary | ICD-10-CM

## 2020-03-09 DIAGNOSIS — Z3403 Encounter for supervision of normal first pregnancy, third trimester: Secondary | ICD-10-CM

## 2020-03-10 LAB — CYTOLOGY - PAP
Chlamydia: NEGATIVE
Comment: NEGATIVE
Comment: NEGATIVE
Comment: NORMAL
Diagnosis: NEGATIVE
High risk HPV: NEGATIVE
Neisseria Gonorrhea: NEGATIVE

## 2020-03-10 LAB — CBC
Hematocrit: 37.4 % (ref 34.0–46.6)
Hemoglobin: 12.3 g/dL (ref 11.1–15.9)
MCH: 26.5 pg — ABNORMAL LOW (ref 26.6–33.0)
MCHC: 32.9 g/dL (ref 31.5–35.7)
MCV: 81 fL (ref 79–97)
Platelets: 304 10*3/uL (ref 150–450)
RBC: 4.64 x10E6/uL (ref 3.77–5.28)
RDW: 13.6 % (ref 11.7–15.4)
WBC: 9.1 10*3/uL (ref 3.4–10.8)

## 2020-03-10 LAB — GLUCOSE TOLERANCE, 2 HOURS W/ 1HR
Glucose, 1 hour: 95 mg/dL (ref 65–179)
Glucose, 2 hour: 106 mg/dL (ref 65–152)
Glucose, Fasting: 72 mg/dL (ref 65–91)

## 2020-03-10 LAB — RPR: RPR Ser Ql: NONREACTIVE

## 2020-03-10 LAB — HIV ANTIBODY (ROUTINE TESTING W REFLEX): HIV Screen 4th Generation wRfx: NONREACTIVE

## 2020-03-10 LAB — ANTIBODY SCREEN: Antibody Screen: NEGATIVE

## 2020-03-16 ENCOUNTER — Telehealth: Payer: Self-pay | Admitting: *Deleted

## 2020-03-16 NOTE — Telephone Encounter (Signed)
Attempted to call patient to inform her that she doesn't need to take extra vit d per Selena Batten. Pt has visit tomorrow will make note on visit to inform her then.

## 2020-03-17 ENCOUNTER — Encounter: Payer: Medicaid Other | Admitting: Women's Health

## 2020-03-18 ENCOUNTER — Encounter: Payer: Medicaid Other | Admitting: Nurse Practitioner

## 2020-03-24 ENCOUNTER — Inpatient Hospital Stay (HOSPITAL_COMMUNITY)
Admission: AD | Admit: 2020-03-24 | Discharge: 2020-03-24 | Disposition: A | Discharge status: Home / Self Care | Payer: Medicaid Other | Attending: Obstetrics & Gynecology | Admitting: Obstetrics & Gynecology

## 2020-03-24 ENCOUNTER — Encounter: Payer: Medicaid Other | Admitting: Advanced Practice Midwife

## 2020-03-24 ENCOUNTER — Encounter (HOSPITAL_COMMUNITY): Payer: Self-pay | Admitting: Obstetrics & Gynecology

## 2020-03-24 DIAGNOSIS — O99613 Diseases of the digestive system complicating pregnancy, third trimester: Secondary | ICD-10-CM | POA: Insufficient documentation

## 2020-03-24 DIAGNOSIS — K219 Gastro-esophageal reflux disease without esophagitis: Secondary | ICD-10-CM | POA: Insufficient documentation

## 2020-03-24 DIAGNOSIS — M545 Low back pain: Secondary | ICD-10-CM | POA: Insufficient documentation

## 2020-03-24 DIAGNOSIS — Z888 Allergy status to other drugs, medicaments and biological substances status: Secondary | ICD-10-CM | POA: Insufficient documentation

## 2020-03-24 DIAGNOSIS — Z882 Allergy status to sulfonamides status: Secondary | ICD-10-CM | POA: Diagnosis not present

## 2020-03-24 DIAGNOSIS — Z3A32 32 weeks gestation of pregnancy: Secondary | ICD-10-CM | POA: Diagnosis not present

## 2020-03-24 DIAGNOSIS — F419 Anxiety disorder, unspecified: Secondary | ICD-10-CM | POA: Insufficient documentation

## 2020-03-24 DIAGNOSIS — Z880 Allergy status to penicillin: Secondary | ICD-10-CM | POA: Insufficient documentation

## 2020-03-24 DIAGNOSIS — O4693 Antepartum hemorrhage, unspecified, third trimester: Secondary | ICD-10-CM | POA: Insufficient documentation

## 2020-03-24 DIAGNOSIS — Z79899 Other long term (current) drug therapy: Secondary | ICD-10-CM | POA: Insufficient documentation

## 2020-03-24 DIAGNOSIS — O4703 False labor before 37 completed weeks of gestation, third trimester: Secondary | ICD-10-CM | POA: Diagnosis not present

## 2020-03-24 DIAGNOSIS — O0932 Supervision of pregnancy with insufficient antenatal care, second trimester: Secondary | ICD-10-CM

## 2020-03-24 DIAGNOSIS — O0933 Supervision of pregnancy with insufficient antenatal care, third trimester: Secondary | ICD-10-CM | POA: Diagnosis not present

## 2020-03-24 DIAGNOSIS — O99343 Other mental disorders complicating pregnancy, third trimester: Secondary | ICD-10-CM | POA: Diagnosis not present

## 2020-03-24 DIAGNOSIS — R1030 Lower abdominal pain, unspecified: Secondary | ICD-10-CM | POA: Insufficient documentation

## 2020-03-24 DIAGNOSIS — Z885 Allergy status to narcotic agent status: Secondary | ICD-10-CM | POA: Insufficient documentation

## 2020-03-24 DIAGNOSIS — Z8719 Personal history of other diseases of the digestive system: Secondary | ICD-10-CM | POA: Diagnosis not present

## 2020-03-24 DIAGNOSIS — O4700 False labor before 37 completed weeks of gestation, unspecified trimester: Secondary | ICD-10-CM

## 2020-03-24 DIAGNOSIS — O26893 Other specified pregnancy related conditions, third trimester: Secondary | ICD-10-CM | POA: Insufficient documentation

## 2020-03-24 LAB — URINALYSIS, ROUTINE W REFLEX MICROSCOPIC
Bilirubin Urine: NEGATIVE
Glucose, UA: NEGATIVE mg/dL
Ketones, ur: NEGATIVE mg/dL
Nitrite: NEGATIVE
Protein, ur: NEGATIVE mg/dL
Specific Gravity, Urine: 1.012 (ref 1.005–1.030)
pH: 6 (ref 5.0–8.0)

## 2020-03-24 LAB — WET PREP, GENITAL
Clue Cells Wet Prep HPF POC: NONE SEEN
Sperm: NONE SEEN
Trich, Wet Prep: NONE SEEN

## 2020-03-24 MED ORDER — NIFEDIPINE 10 MG PO CAPS
10.0000 mg | ORAL_CAPSULE | ORAL | Status: DC | PRN
  Administered 2020-03-24 (×3): 10 mg via ORAL
  Filled 2020-03-24 (×3): qty 1

## 2020-03-24 MED ORDER — LACTATED RINGERS IV SOLN: INTRAVENOUS | Status: DC

## 2020-03-24 MED ORDER — LACTATED RINGERS IV SOLN: Freq: Once | INTRAVENOUS | Status: DC

## 2020-03-24 MED ORDER — BETAMETHASONE SOD PHOS & ACET 6 (3-3) MG/ML IJ SUSP
12.0000 mg | Freq: Once | INTRAMUSCULAR | Status: AC
  Administered 2020-03-24: 12 mg via INTRAMUSCULAR
  Filled 2020-03-24: qty 5

## 2020-03-24 MED ORDER — LACTATED RINGERS IV BOLUS
1000.0000 mL | Freq: Once | INTRAVENOUS | Status: AC
  Administered 2020-03-24: 1000 mL via INTRAVENOUS

## 2020-03-24 MED ORDER — LACTATED RINGERS IV SOLN: Freq: Once | INTRAVENOUS | Status: AC

## 2020-03-24 MED ORDER — FLUCONAZOLE 150 MG PO TABS
150.0000 mg | ORAL_TABLET | Freq: Once | ORAL | Status: AC
  Administered 2020-03-24: 150 mg via ORAL
  Filled 2020-03-24: qty 1

## 2020-03-24 NOTE — MAU Provider Note (Addendum)
Chief Complaint:  Vaginal Bleeding   First Provider Initiated Contact with Patient 03/24/20 0304     HPI: Bethany Barrera is a 24 y.o. G1P0 at 11w5dwho presents to maternity admissions reporting vaginal bleeding (pink) on tissue tonight.  Has some mild lower abdominal pain that comes and goes, radiates to back. . She reports good fetal movement, denies LOF, vaginal itching/burning, urinary symptoms, h/a, dizziness, n/v, diarrhea, constipation or fever/chills.    Vaginal Bleeding The patient's primary symptoms include pelvic pain and vaginal bleeding. The patient's pertinent negatives include no genital itching, genital lesions or genital odor. This is a new problem. The current episode started today. The problem occurs constantly. The problem has been unchanged. The pain is mild. She is pregnant. Associated symptoms include abdominal pain and back pain. Pertinent negatives include no chills, constipation, diarrhea, dysuria, fever, headaches, nausea or vomiting. The vaginal discharge was bloody. The vaginal bleeding is lighter than menses. She has not been passing clots. She has not been passing tissue. Nothing aggravates the symptoms. She has tried nothing for the symptoms.    RN Note: Earlier tonight noticed pink blood on tissue and panties when I went to the BR. Having some pain in lower abd and lower back that comes and goes.   Past Medical History: Past Medical History:  Diagnosis Date  . Anxiety   . Gastritis   . GERD (gastroesophageal reflux disease)   . Muscle weakness   . Seasonal allergies     Past obstetric history: OB History  Gravida Para Term Preterm AB Living  1            SAB TAB Ectopic Multiple Live Births               # Outcome Date GA Lbr Len/2nd Weight Sex Delivery Anes PTL Lv  1 Current             Past Surgical History: Past Surgical History:  Procedure Laterality Date  . NO PAST SURGERIES      Family History: Family History  Problem Relation Age  of Onset  . Clotting disorder Maternal Grandfather   . Diabetes Maternal Grandfather   . Heart disease Maternal Grandfather   . Breast cancer Paternal Grandmother   . Diabetes Mother   . Other Mother        gallstones  . Hypertension Mother   . Heart disease Mother   . Hyperlipidemia Father   . Other Sister        pre-diabetes  . Other Sister        gallstones    Social History: Social History   Tobacco Use  . Smoking status: Never Smoker  . Smokeless tobacco: Never Used  Vaping Use  . Vaping Use: Never used  Substance Use Topics  . Alcohol use: No  . Drug use: No    Allergies:  Allergies  Allergen Reactions  . Sulfa Antibiotics Shortness Of Breath and Other (See Comments)    tremors  . Tramadol Other (See Comments)    headaches  . Penicillin G Swelling    Pt states she cant remember what reaction she had     Meds:  Medications Prior to Admission  Medication Sig Dispense Refill Last Dose  . Blood Pressure Monitor MISC For regular home bp monitoring during pregnancy 1 each 0   . esomeprazole (NEXIUM) 40 MG capsule TAKE 1 CAPSULE BY MOUTH EVERY DAY 20 MINUTES BEFORE BREAKFAST 30 capsule 12   . hydrOXYzine (ATARAX/VISTARIL)  10 MG tablet Take 1 tablet (10 mg total) by mouth 3 (three) times daily as needed. (Patient not taking: Reported on 03/02/2020) 30 tablet 1   . Prenatal Vit-Fe Fumarate-FA (PRENATAL VITAMIN PO) Take by mouth.       I have reviewed patient's Past Medical Hx, Surgical Hx, Family Hx, Social Hx, medications and allergies.   ROS:  Review of Systems  Constitutional: Negative for chills and fever.  Gastrointestinal: Positive for abdominal pain. Negative for constipation, diarrhea, nausea and vomiting.  Genitourinary: Positive for pelvic pain and vaginal bleeding. Negative for dysuria.  Musculoskeletal: Positive for back pain.  Neurological: Negative for headaches.   Other systems negative  Physical Exam   Patient Vitals for the past 24 hrs:   BP Temp Pulse Resp Height Weight  03/24/20 0255 133/80 -- 87 -- -- --  03/24/20 0252 -- 98.4 F (36.9 C) -- 18 5' 5.5" (1.664 m) 108.4 kg   Constitutional: Well-developed, well-nourished female in no acute distress.  Cardiovascular: normal rate and rhythm Respiratory: normal effort, clear to auscultation bilaterally GI: Abd soft, non-tender, gravid appropriate for gestational age.   No rebound or guarding. MS: Extremities nontender, no edema, normal ROM Neurologic: Alert and oriented x 4.  GU: Neg CVAT.  PELVIC EXAM: Cervix pink, visually closed, without lesion, yeast like discharge present.  No hemorrhage. THere is a small amount of pink tinged fluid in vault. Dilation: Closed Effacement (%): Thick Exam by:: Hansel Feinstein, CNM Cervix is soft and vertex is -1.  FHT:  Baseline 130 , moderate variability, accelerations present, no decelerations Contractions: q 4 mins Irregular     Labs: Results for orders placed or performed during the hospital encounter of 03/24/20 (from the past 24 hour(s))  Urinalysis, Routine w reflex microscopic     Status: Abnormal   Collection Time: 03/24/20  3:07 AM  Result Value Ref Range   Color, Urine YELLOW YELLOW   APPearance HAZY (A) CLEAR   Specific Gravity, Urine 1.012 1.005 - 1.030   pH 6.0 5.0 - 8.0   Glucose, UA NEGATIVE NEGATIVE mg/dL   Hgb urine dipstick LARGE (A) NEGATIVE   Bilirubin Urine NEGATIVE NEGATIVE   Ketones, ur NEGATIVE NEGATIVE mg/dL   Protein, ur NEGATIVE NEGATIVE mg/dL   Nitrite NEGATIVE NEGATIVE   Leukocytes,Ua LARGE (A) NEGATIVE   RBC / HPF 0-5 0 - 5 RBC/hpf   WBC, UA 21-50 0 - 5 WBC/hpf   Bacteria, UA RARE (A) NONE SEEN   Squamous Epithelial / LPF 0-5 0 - 5   Mucus PRESENT   Wet prep, genital     Status: Abnormal   Collection Time: 03/24/20  3:27 AM   Specimen: Urine, Clean Catch  Result Value Ref Range   Yeast Wet Prep HPF POC PRESENT (A) NONE SEEN   Trich, Wet Prep NONE SEEN NONE SEEN   Clue Cells Wet Prep HPF  POC NONE SEEN NONE SEEN   WBC, Wet Prep HPF POC MANY (A) NONE SEEN   Sperm NONE SEEN     O/Positive/-- (05/26 1201)  Imaging:  No results found.  MAU Course/MDM: I have ordered labs and reviewed results.  NST reviewed, reactive with frequent contractions Consult Dr Juanna Cao  with presentation, exam findings and test results.  He recommends tocolysis with fluids and Procardia series.  If contractions stop, may discharge home Treatments in MAU included IVF, Procardia series.    0600:  Received the entire Procardia series and 1+ liters of fluid Still contracting, closer  together.  Not terribly painful per patient report.  Recheck of cervix was difficult, unable to reach os, but seems a little shorter and soft.  Consulted Dr Mayford Knife:  Will given Betamethasone and observe in MAU.  If we can recheck later with cervical change, will admit for obs.  Continue IVF for now.   UCs mostly stopped with only irritability now Will discharge home with her returning in am for second betamethasone  Assessment: 1. Late prenatal care in second trimester   2.     Single IUP at [redacted]w[redacted]d 3.     Preterm uterine contractions  Plan: Discharge home Preterm labor precautions Return tomorrow for second betamethasone Encouraged to return here or to other Urgent Care/ED if she develops worsening of symptoms, increase in pain, fever, or other concerning symptoms.      Wynelle Bourgeois CNM, MSN Certified Nurse-Midwife 03/24/2020 3:04 AM

## 2020-03-24 NOTE — Discharge Instructions (Signed)

## 2020-03-24 NOTE — MAU Note (Signed)
Earlier tonight noticed pink blood on tissue and panties when I went to the BR. Having some pain in lower abd and lower back that comes and goes.

## 2020-03-25 ENCOUNTER — Ambulatory Visit (INDEPENDENT_AMBULATORY_CARE_PROVIDER_SITE_OTHER): Payer: Medicaid Other | Admitting: *Deleted

## 2020-03-25 ENCOUNTER — Encounter: Payer: Self-pay | Admitting: *Deleted

## 2020-03-25 ENCOUNTER — Other Ambulatory Visit: Payer: Self-pay | Admitting: Adult Health

## 2020-03-25 VITALS — BP 126/81 | HR 100 | Ht 65.5 in | Wt 237.5 lb

## 2020-03-25 DIAGNOSIS — Z3A32 32 weeks gestation of pregnancy: Secondary | ICD-10-CM

## 2020-03-25 DIAGNOSIS — Z3403 Encounter for supervision of normal first pregnancy, third trimester: Secondary | ICD-10-CM

## 2020-03-25 DIAGNOSIS — O0932 Supervision of pregnancy with insufficient antenatal care, second trimester: Secondary | ICD-10-CM

## 2020-03-25 DIAGNOSIS — Z331 Pregnant state, incidental: Secondary | ICD-10-CM

## 2020-03-25 DIAGNOSIS — Z1389 Encounter for screening for other disorder: Secondary | ICD-10-CM

## 2020-03-25 DIAGNOSIS — O4700 False labor before 37 completed weeks of gestation, unspecified trimester: Secondary | ICD-10-CM

## 2020-03-25 DIAGNOSIS — O479 False labor, unspecified: Secondary | ICD-10-CM

## 2020-03-25 LAB — POCT URINALYSIS DIPSTICK OB
Glucose, UA: NEGATIVE
Ketones, UA: NEGATIVE
Nitrite, UA: NEGATIVE
POC,PROTEIN,UA: NEGATIVE

## 2020-03-25 MED ORDER — BETAMETHASONE SOD PHOS & ACET 6 (3-3) MG/ML IJ SUSP
12.0000 mg | Freq: Once | INTRAMUSCULAR | Status: AC
  Administered 2020-03-25: 12 mg via INTRAMUSCULAR

## 2020-03-25 NOTE — Progress Notes (Signed)
   NURSE VISIT- INJECTION  SUBJECTIVE:  Bethany Barrera is a 24 y.o. G1P0 female here for a Betamethasone for per provider order . She is [redacted]w[redacted]d pregnant.   OBJECTIVE:  BP 126/81 (BP Location: Right Arm, Patient Position: Sitting, Cuff Size: Large)   Pulse 100   Ht 5' 5.5" (1.664 m)   Wt 237 lb 8 oz (107.7 kg)   LMP 07/24/2019 (Within Days)   BMI 38.92 kg/m   Appears well, in no apparent distress  Injection administered in: Right upper quad. gluteus  Meds ordered this encounter  Medications  . betamethasone acetate-betamethasone sodium phosphate (CELESTONE) injection 12 mg    ASSESSMENT: Pregnancy [redacted]w[redacted]d Betamethasone for per provider order. PLAN: Follow-up: as scheduled   Malachy Mood  03/25/2020 10:58 AM

## 2020-03-27 LAB — GC/CHLAMYDIA PROBE AMP
Chlamydia trachomatis, NAA: NEGATIVE
Neisseria Gonorrhoeae by PCR: NEGATIVE

## 2020-03-27 LAB — MED LIST OPTION NOT SELECTED

## 2020-03-28 LAB — PMP SCREEN PROFILE (10S), URINE
Amphetamine Scrn, Ur: NEGATIVE ng/mL
BARBITURATE SCREEN URINE: NEGATIVE ng/mL
BENZODIAZEPINE SCREEN, URINE: NEGATIVE ng/mL
CANNABINOIDS UR QL SCN: NEGATIVE ng/mL
Cocaine (Metab) Scrn, Ur: NEGATIVE ng/mL
Creatinine(Crt), U: 45.9 mg/dL (ref 20.0–300.0)
Methadone Screen, Urine: NEGATIVE ng/mL
OXYCODONE+OXYMORPHONE UR QL SCN: NEGATIVE ng/mL
Opiate Scrn, Ur: NEGATIVE ng/mL
Ph of Urine: 6.9 (ref 4.5–8.9)
Phencyclidine Qn, Ur: NEGATIVE ng/mL
Propoxyphene Scrn, Ur: NEGATIVE ng/mL

## 2020-04-13 ENCOUNTER — Encounter: Payer: Self-pay | Admitting: Women's Health

## 2020-04-13 ENCOUNTER — Ambulatory Visit (INDEPENDENT_AMBULATORY_CARE_PROVIDER_SITE_OTHER): Payer: Medicaid Other | Admitting: Women's Health

## 2020-04-13 VITALS — BP 134/87 | HR 102 | Wt 237.8 lb

## 2020-04-13 DIAGNOSIS — Z331 Pregnant state, incidental: Secondary | ICD-10-CM

## 2020-04-13 DIAGNOSIS — Z3A35 35 weeks gestation of pregnancy: Secondary | ICD-10-CM

## 2020-04-13 DIAGNOSIS — Z1389 Encounter for screening for other disorder: Secondary | ICD-10-CM

## 2020-04-13 DIAGNOSIS — Z3403 Encounter for supervision of normal first pregnancy, third trimester: Secondary | ICD-10-CM

## 2020-04-13 LAB — POCT URINALYSIS DIPSTICK OB
Blood, UA: NEGATIVE
Glucose, UA: NEGATIVE
Ketones, UA: NEGATIVE
Leukocytes, UA: NEGATIVE
Nitrite, UA: NEGATIVE
POC,PROTEIN,UA: NEGATIVE

## 2020-04-13 NOTE — Progress Notes (Signed)
LOW-RISK PREGNANCY VISIT Patient name: Bethany Barrera MRN [id number]  Date of birth: May 03, 1996 Chief Complaint:   Routine Prenatal Visit (want see yeast infection gone)  History of Present Illness:   Bethany Barrera is a 24 y.o. G1P0 female at [redacted]w[redacted]d with an Estimated Date of Delivery: 05/14/20 being seen today for ongoing management of a low-risk pregnancy.  Depression screen [facility] 2/9 03/02/2020 02/05/2020 11/07/2017  Decreased Interest 1 1 1   Down, Depressed, Hopeless 0 0 1  PHQ - 2 Score 1 1 2   Altered sleeping 0 1 0  Tired, decreased energy 1 1 1   Change in appetite 0 0 0  Feeling bad or failure about yourself  0 0 0  Trouble concentrating 0 0 0  Moving slowly or fidgety/restless 0 0 0  Suicidal thoughts 0 0 0  PHQ-9 Score 2 3 3   Difficult doing work/chores Not difficult at all - Somewhat difficult    Today she reports no care since new ob visit at 29wks, transportation issues, now fixed. Had yeast infection, no sx now. Went to [facility] for preterm uc's, cx closed, got BMZ x2. Horizon tube didn't have enough blood to run sample- pt declines redraw today. Contractions: Not present.  .  Movement: Present. denies leaking of fluid. Review of Systems:   Pertinent items are noted in HPI Denies abnormal vaginal discharge w/ itching/odor/irritation, headaches, visual changes, shortness of breath, chest pain, abdominal pain, severe nausea/vomiting, or problems with urination or bowel movements unless otherwise stated above. Pertinent History Reviewed:  Reviewed past medical,surgical, social, obstetrical and family history.  Reviewed problem list, medications and allergies. Physical Assessment:   Vitals:   04/13/20 0849  BP: 134/87  Pulse: (!) 102  Weight: 237 lb 12.8 oz (107.9 kg)  Body mass index is 38.97 kg/m.        Physical Examination:   General appearance: Well appearing, and in no distress  Mental status: Alert, oriented to person, place, and time  Skin: Warm &  dry  Cardiovascular: Normal heart rate noted  Respiratory: Normal respiratory effort, no distress  Abdomen: Soft, gravid, nontender  Pelvic: Cervical exam deferred         Extremities: Edema: None  Fetal Status: Fetal Heart Rate (bpm): 135 Fundal Height: 35 cm Movement: Present    Chaperone: n/a    Results for orders placed or performed in visit on 04/13/20 (from the past 24 hour(s))  POC Urinalysis Dipstick OB   Collection Time: 04/13/20  8:57 AM  Result Value Ref Range   Color, UA     Clarity, UA     Glucose, UA Negative Negative   Bilirubin, UA     Ketones, UA neg    Spec Grav, UA     Blood, UA neg    pH, UA     POC,PROTEIN,UA Negative Negative, Trace, Small (1+), Moderate (2+), Large (3+), 4+   Urobilinogen, UA     Nitrite, UA neg    Leukocytes, UA Negative Negative   Appearance     Odor      Assessment & Plan:  1) Low-risk pregnancy G1P0 at [redacted]w[redacted]d with an Estimated Date of Delivery: 05/14/20    Meds: No orders of the defined types were placed in this encounter.  Labs/procedures today: declined tdap  Plan:  Continue routine obstetrical care  Next visit: prefers will be in person for gbs    Reviewed: Preterm labor symptoms and general obstetric precautions including but not limited to vaginal bleeding, contractions,  leaking of fluid and fetal movement were reviewed in detail with the patient.  All questions were answered.  Follow-up: Return in about 1 week (around 04/20/2020) for LROB, CNM, in person.  Orders Placed This Encounter  Procedures  . POC Urinalysis Dipstick OB   Cheral Marker CNM, [facility] 04/13/2020 9:41 AM

## 2020-04-13 NOTE — Patient Instructions (Signed)
Bethany Barrera, I greatly value your feedback.  If you receive a survey following your visit with [place] today, we appreciate you taking the time to fill it out.  Thanks, Joellyn Haff, CNM, WHNP-BC  [facility] ([address] [place], [place] [address]) Entrance C, located off of [address] valet parking   Go to [email or web address] to register for FREE online childbirth classes    Call the office [phone]) or go to [facility] if:  You begin to have strong, frequent contractions  Your water breaks.  Sometimes it is a big gush of fluid, sometimes it is just a trickle that keeps getting your panties wet or running down your legs  You have vaginal bleeding.  It is normal to have a small amount of spotting if your cervix was checked.   You don't feel your baby moving like normal.  If you don't, get you something to eat and drink and lay down and focus on feeling your baby move.  You should feel at least 10 movements in 2 hours.  If you don't, you should call the office or go to [facility]   Call the office [phone]) or go to [facility] hospital for these signs of pre-eclampsia:  Severe headache that does not go away with Tylenol  Visual changes- seeing spots, double, blurred vision  Pain under your right breast or upper abdomen that does not go away with Tums or heartburn medicine  Nausea and/or vomiting  Severe swelling in your hands, feet, and face    Home Blood Pressure Monitoring for Patients   Your provider has recommended that you check your blood pressure (BP) at least once a week at home. If you do not have a blood pressure cuff at home, one will be provided for you. Contact your provider if you have not received your monitor within 1 week.   Helpful Tips for Accurate Home Blood Pressure Checks  . Don't smoke, exercise, or drink caffeine 30 minutes before checking your BP . Use the restroom before checking your BP (a full  bladder can raise your pressure) . Relax in a comfortable upright chair . Feet on the ground . Left arm resting comfortably on a flat surface at the level of your heart . Legs uncrossed . Back supported . Sit quietly and don't talk . Place the cuff on your bare arm . Adjust snuggly, so that only two fingertips can fit between your skin and the top of the cuff . Check 2 readings separated by at least one minute . Keep a log of your BP readings . For a visual, please reference this diagram: http://ccnc.care/bpdiagram  Provider Name: [facility]     Phone: [phone]  Zone 1: ALL CLEAR  Continue to monitor your symptoms:  . BP reading is less than 140 (top number) or less than 90 (bottom number)  . No right upper stomach pain . No headaches or seeing spots . No feeling nauseated or throwing up . No swelling in face and hands  Zone 2: CAUTION Call your doctor's office for any of the following:  . BP reading is greater than 140 (top number) or greater than 90 (bottom number)  . Stomach pain under your ribs in the middle or right side . Headaches or seeing spots . Feeling nauseated or throwing up . Swelling in face and hands  Zone 3: EMERGENCY  Seek immediate medical care if you have any of  the following:  . BP reading is greater than160 (top number) or greater than 110 (bottom number) . Severe headaches not improving with Tylenol . Serious difficulty catching your breath . Any worsening symptoms from Zone 2  Preterm Labor and Birth Information  The normal length of a pregnancy is 39-41 weeks. Preterm labor is when labor starts before 37 completed weeks of pregnancy. What are the risk factors for preterm labor? Preterm labor is more likely to occur in women who:  Have certain infections during pregnancy such as a bladder infection, sexually transmitted infection, or infection inside the uterus (chorioamnionitis).  Have a shorter-than-normal cervix.  Have gone into  preterm labor before.  Have had surgery on their cervix.  Are younger than age 54 or older than age 25.  Are African American.  Are pregnant with twins or multiple babies (multiple gestation).  Take street drugs or smoke while pregnant.  Do not gain enough weight while pregnant.  Became pregnant shortly after having been pregnant. What are the symptoms of preterm labor? Symptoms of preterm labor include:  Cramps similar to those that can happen during a menstrual period. The cramps may happen with diarrhea.  Pain in the abdomen or lower back.  Regular uterine contractions that may feel like tightening of the abdomen.  A feeling of increased pressure in the pelvis.  Increased watery or bloody mucus discharge from the vagina.  Water breaking (ruptured amniotic sac). Why is it important to recognize signs of preterm labor? It is important to recognize signs of preterm labor because babies who are born prematurely may not be fully developed. This can put them at an increased risk for:  Long-term (chronic) heart and lung problems.  Difficulty immediately after birth with regulating body systems, including blood sugar, body temperature, heart rate, and breathing rate.  Bleeding in the brain.  Cerebral palsy.  Learning difficulties.  Death. These risks are highest for babies who are born before 48 weeks of pregnancy. How is preterm labor treated? Treatment depends on the length of your pregnancy, your condition, and the health of your baby. It may involve: 1. Having a stitch (suture) placed in your cervix to prevent your cervix from opening too early (cerclage). 2. Taking or being given medicines, such as: ? Hormone medicines. These may be given early in pregnancy to help support the pregnancy. ? Medicine to stop contractions. ? Medicines to help mature the baby's lungs. These may be prescribed if the risk of delivery is high. ? Medicines to prevent your baby from  developing cerebral palsy. If the labor happens before 34 weeks of pregnancy, you may need to stay in the hospital. What should I do if I think I am in preterm labor? If you think that you are going into preterm labor, call your health care provider right away. How can I prevent preterm labor in future pregnancies? To increase your chance of having a full-term pregnancy:  Do not use any tobacco products, such as cigarettes, chewing tobacco, and e-cigarettes. If you need help quitting, ask your health care provider.  Do not use street drugs or medicines that have not been prescribed to you during your pregnancy.  Talk with your health care provider before taking any herbal supplements, even if you have been taking them regularly.  Make sure you gain a healthy amount of weight during your pregnancy.  Watch for infection. If you think that you might have an infection, get it checked right away.  Make sure to  tell your health care provider if you have gone into preterm labor before. This information is not intended to replace advice given to you by your health care provider. Make sure you discuss any questions you have with your health care provider. Document Revised: 01/16/2019 Document Reviewed: 02/15/2016 Elsevier Patient Education  [place].

## 2020-04-19 ENCOUNTER — Ambulatory Visit (INDEPENDENT_AMBULATORY_CARE_PROVIDER_SITE_OTHER): Payer: Medicaid Other | Admitting: Obstetrics & Gynecology

## 2020-04-19 ENCOUNTER — Encounter: Payer: Self-pay | Admitting: Obstetrics & Gynecology

## 2020-04-19 ENCOUNTER — Other Ambulatory Visit (HOSPITAL_COMMUNITY)
Admission: RE | Admit: 2020-04-19 | Discharge: 2020-04-19 | Disposition: A | Discharge status: Home / Self Care | Payer: Medicaid Other | Source: Ambulatory Visit | Attending: Obstetrics & Gynecology | Admitting: Obstetrics & Gynecology

## 2020-04-19 VITALS — BP 125/87 | HR 107 | Wt 241.0 lb

## 2020-04-19 DIAGNOSIS — Z1389 Encounter for screening for other disorder: Secondary | ICD-10-CM

## 2020-04-19 DIAGNOSIS — Z113 Encounter for screening for infections with a predominantly sexual mode of transmission: Secondary | ICD-10-CM | POA: Diagnosis present

## 2020-04-19 DIAGNOSIS — Z3403 Encounter for supervision of normal first pregnancy, third trimester: Secondary | ICD-10-CM | POA: Insufficient documentation

## 2020-04-19 DIAGNOSIS — Z3A36 36 weeks gestation of pregnancy: Secondary | ICD-10-CM

## 2020-04-19 DIAGNOSIS — Z331 Pregnant state, incidental: Secondary | ICD-10-CM

## 2020-04-19 LAB — POCT URINALYSIS DIPSTICK OB
Blood, UA: NEGATIVE
Glucose, UA: NEGATIVE
Ketones, UA: NEGATIVE
Nitrite, UA: NEGATIVE
POC,PROTEIN,UA: NEGATIVE

## 2020-04-19 NOTE — Progress Notes (Signed)
LOW-RISK PREGNANCY VISIT Patient name: Bethany Barrera MRN [id number]  Date of birth: [birth year] Chief Complaint:   Routine Prenatal Visit  History of Present Illness:   Bethany Barrera is a 24 y.o. G1P0 female at [redacted]w[redacted]d with an Estimated Date of Delivery: 05/14/20 being seen today for ongoing management of a low-risk pregnancy.  Depression screen [facility] 2/9 03/02/2020 02/05/2020 11/07/2017  Decreased Interest 1 1 1   Down, Depressed, Hopeless 0 0 1  PHQ - 2 Score 1 1 2   Altered sleeping 0 1 0  Tired, decreased energy 1 1 1   Change in appetite 0 0 0  Feeling bad or failure about yourself  0 0 0  Trouble concentrating 0 0 0  Moving slowly or fidgety/restless 0 0 0  Suicidal thoughts 0 0 0  PHQ-9 Score 2 3 3   Difficult doing work/chores Not difficult at all - Somewhat difficult    [name] Contractions: Not present. Vag. Bleeding: None.  Movement: Present. denies leaking of fluid. Review of Systems:   Pertinent items are noted in HPI Denies abnormal vaginal discharge w/ itching/odor/irritation, headaches, visual changes, shortness of breath, chest pain, abdominal pain, severe nausea/vomiting, or problems with urination or bowel movements unless otherwise stated above. Pertinent History Reviewed:  Reviewed past medical,surgical, social, obstetrical and family history.  Reviewed problem list, medications and allergies. Physical Assessment:   Vitals:   04/19/20 1027  BP: 125/87  Pulse: (!) 107  Weight: 241 lb (109.3 kg)  Body mass index is 39.49 kg/m.        Physical Examination:   General appearance: Well appearing, and in no distress  Mental status: Alert, oriented to person, place, and time  Skin: Warm & dry  Cardiovascular: Normal heart rate noted  Respiratory: Normal respiratory effort, no distress  Abdomen: Soft, gravid, nontender  Pelvic: Cervical exam deferred         Extremities: Edema: None  Fetal Status: Fetal Heart Rate (bpm): 143 Fundal  Height: 36 cm Movement: Present Presentation: Vertex  Chaperone:    Results for orders placed or performed in visit on 04/19/20 (from the past 24 hour(s))  POC Urinalysis Dipstick OB   Collection Time: 04/19/20 10:23 AM  Result Value Ref Range   Color, UA     Clarity, UA     Glucose, UA Negative Negative   Bilirubin, UA     Ketones, UA n    Spec Grav, UA     Blood, UA n    pH, UA     POC,PROTEIN,UA Negative Negative, Trace, Small (1+), Moderate (2+), Large (3+), 4+   Urobilinogen, UA     Nitrite, UA n    Leukocytes, UA Moderate (2+) (A) Negative   Appearance     Odor      Assessment & Plan:  1) Low-risk pregnancy G1P0 at [redacted]w[redacted]d with an Estimated Date of Delivery: 05/14/20   2) Cultures done today,    Meds: No orders of the defined types were placed in this encounter.  Labs/procedures today:   Plan:  Continue routine obstetrical care  Next visit: prefers in person    Reviewed: Term labor symptoms and general obstetric precautions including but not limited to vaginal bleeding, contractions, leaking of fluid and fetal movement were reviewed in detail with the patient.  All questions were answered.  home bp cuff. Rx faxed to . Check bp weekly, let 04/21/20 know if >140/90.   Follow-up: Return in about 10 days (around  04/29/2020) for LROB.  Orders Placed This Encounter  Procedures  . Strep Gp B NAA+Rflx  . POC Urinalysis Dipstick OB   Lazaro Arms  04/19/2020 10:55 AM

## 2020-04-20 LAB — CERVICOVAGINAL ANCILLARY ONLY
Chlamydia: NEGATIVE
Comment: NEGATIVE
Comment: NORMAL
Neisseria Gonorrhea: NEGATIVE

## 2020-04-25 LAB — STREP GP B SUSCEPTIBILITY

## 2020-04-25 LAB — STREP GP B NAA+RFLX: Strep Gp B NAA+Rflx: POSITIVE — AB

## 2020-04-26 ENCOUNTER — Encounter: Payer: Self-pay | Admitting: *Deleted

## 2020-04-29 ENCOUNTER — Encounter: Payer: Medicaid Other | Admitting: Women's Health

## 2020-05-02 ENCOUNTER — Encounter: Payer: Self-pay | Admitting: Advanced Practice Midwife

## 2020-05-02 ENCOUNTER — Ambulatory Visit (INDEPENDENT_AMBULATORY_CARE_PROVIDER_SITE_OTHER): Payer: Medicaid Other | Admitting: Advanced Practice Midwife

## 2020-05-02 VITALS — BP 128/82 | HR 115 | Wt 241.0 lb

## 2020-05-02 DIAGNOSIS — Z331 Pregnant state, incidental: Secondary | ICD-10-CM

## 2020-05-02 DIAGNOSIS — Z3403 Encounter for supervision of normal first pregnancy, third trimester: Secondary | ICD-10-CM

## 2020-05-02 DIAGNOSIS — O288 Other abnormal findings on antenatal screening of mother: Secondary | ICD-10-CM

## 2020-05-02 DIAGNOSIS — Z3689 Encounter for other specified antenatal screening: Secondary | ICD-10-CM

## 2020-05-02 DIAGNOSIS — Z1389 Encounter for screening for other disorder: Secondary | ICD-10-CM

## 2020-05-02 DIAGNOSIS — Z3A38 38 weeks gestation of pregnancy: Secondary | ICD-10-CM

## 2020-05-02 DIAGNOSIS — O99820 Streptococcus B carrier state complicating pregnancy: Secondary | ICD-10-CM

## 2020-05-02 LAB — POCT URINALYSIS DIPSTICK OB
Glucose, UA: NEGATIVE
Ketones, UA: NEGATIVE
Nitrite, UA: NEGATIVE

## 2020-05-02 NOTE — Progress Notes (Signed)
   LOW-RISK PREGNANCY VISIT Patient name: Bethany Barrera MRN [id number]  Date of birth: 1996-06-12 Chief Complaint:   Routine Prenatal Visit  History of Present Illness:   KAREEN JEFFERYS is a 24 y.o. G1P0 female at [redacted]w[redacted]d with an Estimated Date of Delivery: 05/14/20 being seen today for ongoing management of a low-risk pregnancy.  Today she reports questions about chronic pain/+ANA/elevated SED rate and how that may affect labor pain; is unsure of PCN allergy- will check with her mom- may be able to get PCN for GBS ppx if mild. Contractions: Not present. Vag. Bleeding: None.  Movement: Present. denies leaking of fluid. Review of Systems:   Pertinent items are noted in HPI Denies abnormal vaginal discharge w/ itching/odor/irritation, headaches, visual changes, shortness of breath, chest pain, abdominal pain, severe nausea/vomiting, or problems with urination or bowel movements unless otherwise stated above. Pertinent History Reviewed:  Reviewed past medical,surgical, social, obstetrical and family history.  Reviewed problem list, medications and allergies. Physical Assessment:   Vitals:   05/02/20 1208  BP: 128/82  Pulse: (!) 115  Weight: (!) 241 lb (109.3 kg)  Body mass index is 39.49 kg/m.        Physical Examination:   General appearance: Well appearing, and in no distress  Mental status: Alert, oriented to person, place, and time  Skin: Warm & dry  Cardiovascular: Normal heart rate noted  Respiratory: Normal respiratory effort, no distress  Abdomen: Soft, gravid, nontender  Pelvic: Cervical exam deferred         Extremities: Edema: None  Fetal Status: Fetal Heart Rate (bpm): 154 Fundal Height: 38 cm Movement: Present Presentation: Vertex  Results for orders placed or performed in visit on 05/02/20 (from the past 24 hour(s))  POC Urinalysis Dipstick OB   Collection Time: 05/02/20 12:10 PM  Result Value Ref Range   Color, UA     Clarity, UA     Glucose, UA Negative  Negative   Bilirubin, UA     Ketones, UA neg    Spec Grav, UA     Blood, UA trace    pH, UA     POC,PROTEIN,UA Trace Negative, Trace, Small (1+), Moderate (2+), Large (3+), 4+   Urobilinogen, UA     Nitrite, UA neg    Leukocytes, UA Trace (A) Negative   Appearance     Odor      Assessment & Plan:  1) Low-risk pregnancy G1P0 at [redacted]w[redacted]d with an Estimated Date of Delivery: 05/14/20   2) +ANA/elevated SED rate, plans to f/u PP  3) GBS+, unsure of what PCN rxn was- will check with mom, but may be able to give for GBS ppx   Meds: No orders of the defined types were placed in this encounter.  Labs/procedures today: bedside u/s to verify vtx  Plan:  Continue routine obstetrical care   Reviewed: Term labor symptoms and general obstetric precautions including but not limited to vaginal bleeding, contractions, leaking of fluid and fetal movement were reviewed in detail with the patient.  All questions were answered. Has home bp cuff. Check bp weekly, let us know if >140/90.   Follow-up: Return in about 1 week (around 05/09/2020) for LROB, in person.  Orders Placed This Encounter  Procedures  . POC Urinalysis Dipstick OB   Arabella Merles [facility] 05/02/2020 1:15 PM

## 2020-05-02 NOTE — Patient Instructions (Signed)
Braxton Hicks Contractions °Contractions of the uterus can occur throughout pregnancy, but they are not always a sign that you are in labor. You may have practice contractions called Braxton Hicks contractions. These false labor contractions are sometimes confused with true labor. °What are Braxton Hicks contractions? °Braxton Hicks contractions are tightening movements that occur in the muscles of the uterus before labor. Unlike true labor contractions, these contractions do not result in opening (dilation) and thinning of the cervix. Toward the end of pregnancy (32-34 weeks), Braxton Hicks contractions can happen more often and may become stronger. These contractions are sometimes difficult to tell apart from true labor because they can be very uncomfortable. You should not feel embarrassed if you go to the hospital with false labor. °Sometimes, the only way to tell if you are in true labor is for your health care provider to look for changes in the cervix. The health care provider will do a physical exam and may monitor your contractions. If you are not in true labor, the exam should show that your cervix is not dilating and your water has not broken. °If there are no other health problems associated with your pregnancy, it is completely safe for you to be sent home with false labor. You may continue to have Braxton Hicks contractions until you go into true labor. °How to tell the difference between true labor and false labor °True labor °· Contractions last 30-70 seconds. °· Contractions become very regular. °· Discomfort is usually felt in the top of the uterus, and it spreads to the lower abdomen and low back. °· Contractions do not go away with walking. °· Contractions usually become more intense and increase in frequency. °· The cervix dilates and gets thinner. °False labor °· Contractions are usually shorter and not as strong as true labor contractions. °· Contractions are usually irregular. °· Contractions  are often felt in the front of the lower abdomen and in the groin. °· Contractions may go away when you walk around or change positions while lying down. °· Contractions get weaker and are shorter-lasting as time goes on. °· The cervix usually does not dilate or become thin. °Follow these instructions at home: ° °· Take over-the-counter and prescription medicines only as told by your health care provider. °· Keep up with your usual exercises and follow other instructions from your health care provider. °· Eat and drink lightly if you think you are going into labor. °· If Braxton Hicks contractions are making you uncomfortable: °? Change your position from lying down or resting to walking, or change from walking to resting. °? Sit and rest in a tub of warm water. °? Drink enough fluid to keep your urine pale yellow. Dehydration may cause these contractions. °? Do slow and deep breathing several times an hour. °· Keep all follow-up prenatal visits as told by your health care provider. This is important. °Contact a health care provider if: °· You have a fever. °· You have continuous pain in your abdomen. °Get help right away if: °· Your contractions become stronger, more regular, and closer together. °· You have fluid leaking or gushing from your vagina. °· You pass blood-tinged mucus (bloody show). °· You have bleeding from your vagina. °· You have low back pain that you never had before. °· You feel your baby’s head pushing down and causing pelvic pressure. °· Your baby is not moving inside you as much as it used to. °Summary °· Contractions that occur before labor are   called Braxton Hicks contractions, false labor, or practice contractions. °· Braxton Hicks contractions are usually shorter, weaker, farther apart, and less regular than true labor contractions. True labor contractions usually become progressively stronger and regular, and they become more frequent. °· Manage discomfort from Braxton Hicks contractions  by changing position, resting in a warm bath, drinking plenty of water, or practicing deep breathing. °This information is not intended to replace advice given to you by your health care provider. Make sure you discuss any questions you have with your health care provider. °Document Revised: 09/06/2017 Document Reviewed: 02/07/2017 °Elsevier Patient Education © 2020 Elsevier Inc. ° °

## 2020-05-10 ENCOUNTER — Encounter: Payer: Self-pay | Admitting: Women's Health

## 2020-05-10 ENCOUNTER — Ambulatory Visit (INDEPENDENT_AMBULATORY_CARE_PROVIDER_SITE_OTHER): Payer: Medicaid Other | Admitting: Women's Health

## 2020-05-10 VITALS — BP 132/85 | HR 103 | Wt 244.0 lb

## 2020-05-10 DIAGNOSIS — Z3A39 39 weeks gestation of pregnancy: Secondary | ICD-10-CM | POA: Diagnosis not present

## 2020-05-10 DIAGNOSIS — Z3403 Encounter for supervision of normal first pregnancy, third trimester: Secondary | ICD-10-CM

## 2020-05-10 DIAGNOSIS — Z1389 Encounter for screening for other disorder: Secondary | ICD-10-CM | POA: Diagnosis not present

## 2020-05-10 DIAGNOSIS — Z331 Pregnant state, incidental: Secondary | ICD-10-CM

## 2020-05-10 LAB — POCT URINALYSIS DIPSTICK OB
Blood, UA: NEGATIVE
Glucose, UA: NEGATIVE
Ketones, UA: NEGATIVE
Leukocytes, UA: NEGATIVE
Nitrite, UA: NEGATIVE
POC,PROTEIN,UA: NEGATIVE

## 2020-05-10 NOTE — Progress Notes (Signed)
LOW-RISK PREGNANCY VISIT Patient name: Bethany Barrera MRN [id number]  Date of birth: Oct 09, 1995 Chief Complaint:   Routine Prenatal Visit  History of Present Illness:   Bethany Barrera is a 24 y.o. G1P0 female at [redacted]w[redacted]d with an Estimated Date of Delivery: 05/14/20 being seen today for ongoing management of a low-risk pregnancy.  Depression screen [facility] 2/9 03/02/2020 02/05/2020 11/07/2017  Decreased Interest 1 1 1   Down, Depressed, Hopeless 0 0 1  PHQ - 2 Score 1 1 2   Altered sleeping 0 1 0  Tired, decreased energy 1 1 1   Change in appetite 0 0 0  Feeling bad or failure about yourself  0 0 0  Trouble concentrating 0 0 0  Moving slowly or fidgety/restless 0 0 0  Suicidal thoughts 0 0 0  PHQ-9 Score 2 3 3   Difficult doing work/chores Not difficult at all - Somewhat difficult    [name] Checked w/ mom about PCN allergy, unsure of reaction, but took amoxicillin w/o problems.  Denies ha, visual changes, ruq/epigastric pain, n/v.   Contractions: Not present. Vag. Bleeding: None.  Movement: Present. denies leaking of fluid. Review of Systems:   Pertinent items are noted in HPI Denies abnormal vaginal discharge w/ itching/odor/irritation, headaches, visual changes, shortness of breath, chest pain, abdominal pain, severe nausea/vomiting, or problems with urination or bowel movements unless otherwise stated above. Pertinent History Reviewed:  Reviewed past medical,surgical, social, obstetrical and family history.  Reviewed problem list, medications and allergies. Physical Assessment:   Vitals:   05/10/20 1358  BP: 132/85  Pulse: (!) 103  Weight: 244 lb (110.7 kg)  Body mass index is 39.99 kg/m.        Physical Examination:   General appearance: Well appearing, and in no distress  Mental status: Alert, oriented to person, place, and time  Skin: Warm & dry  Cardiovascular: Normal heart rate noted  Respiratory: Normal respiratory effort, no  distress  Abdomen: Soft, gravid, nontender  Pelvic: Cervical exam deferred         Extremities: Edema: None  Fetal Status: Fetal Heart Rate (bpm): 150 Fundal Height: 38 cm Movement: Present Presentation: Vertex  Chaperone: n/a    Results for orders placed or performed in visit on 05/10/20 (from the past 24 hour(s))  POC Urinalysis Dipstick OB   Collection Time: 05/10/20  2:05 PM  Result Value Ref Range   Color, UA     Clarity, UA     Glucose, UA Negative Negative   Bilirubin, UA     Ketones, UA neg    Spec Grav, UA     Blood, UA neg    pH, UA     POC,PROTEIN,UA Negative Negative, Trace, Small (1+), Moderate (2+), Large (3+), 4+   Urobilinogen, UA     Nitrite, UA neg    Leukocytes, UA Negative Negative   Appearance     Odor      Assessment & Plan:  1) Low-risk pregnancy G1P0 at [redacted]w[redacted]d with an Estimated Date of Delivery: 05/14/20   Meds: No orders of the defined types were placed in this encounter.  Labs/procedures today: none  Plan:  Continue routine obstetrical care, reviewed pre-e s/s, reasons to seek care Next visit: prefers will be in person for nst for postdates (bpp not available)    Reviewed: Term labor symptoms and general obstetric precautions including but not limited to vaginal bleeding, contractions, leaking of fluid and fetal movement were reviewed in detail with the patient.  All questions were answered. Has home bp cuff, but not accurate.   Follow-up: Return in about 6 days (around 05/16/2020) for LROB, NST, in person, CNM.  Orders Placed This Encounter  Procedures  . POC Urinalysis Dipstick OB   Cheral Marker CNM, [facility] 05/10/2020 2:43 PM

## 2020-05-10 NOTE — Patient Instructions (Signed)
Marcelyn Bruins, I greatly value your feedback.  If you receive a survey following your visit with [place] today, we appreciate you taking the time to fill it out.  Thanks, Joellyn Haff, CNM, WHNP-BC  [facility] ([address] [place], [place] [address]) Entrance C, located off of [address] valet parking   Go to [email or web address] to register for FREE online childbirth classes    Call the office [phone]) or go to [facility] if:  You begin to have strong, frequent contractions  Your water breaks.  Sometimes it is a big gush of fluid, sometimes it is just a trickle that keeps getting your panties wet or running down your legs  You have vaginal bleeding.  It is normal to have a small amount of spotting if your cervix was checked.   You don't feel your baby moving like normal.  If you don't, get you something to eat and drink and lay down and focus on feeling your baby move.  You should feel at least 10 movements in 2 hours.  If you don't, you should call the office or go to [facility].   Call the office [phone]) or go to [facility] hospital for these signs of pre-eclampsia:  Severe headache that does not go away with Tylenol  Visual changes- seeing spots, double, blurred vision  Pain under your right breast or upper abdomen that does not go away with Tums or heartburn medicine  Nausea and/or vomiting  Severe swelling in your hands, feet, and face    Home Blood Pressure Monitoring for Patients   Your provider has recommended that you check your blood pressure (BP) at least once a week at home. If you do not have a blood pressure cuff at home, one will be provided for you. Contact your provider if you have not received your monitor within 1 week.   Helpful Tips for Accurate Home Blood Pressure Checks  . Don't smoke, exercise, or drink caffeine 30 minutes before checking your BP . Use the restroom before checking your BP (a full  bladder can raise your pressure) . Relax in a comfortable upright chair . Feet on the ground . Left arm resting comfortably on a flat surface at the level of your heart . Legs uncrossed . Back supported . Sit quietly and don't talk . Place the cuff on your bare arm . Adjust snuggly, so that only two fingertips can fit between your skin and the top of the cuff . Check 2 readings separated by at least one minute . Keep a log of your BP readings . For a visual, please reference this diagram: http://ccnc.care/bpdiagram  Provider Name: [facility]     Phone: [phone]  Zone 1: ALL CLEAR  Continue to monitor your symptoms:  . BP reading is less than 140 (top number) or less than 90 (bottom number)  . No right upper stomach pain . No headaches or seeing spots . No feeling nauseated or throwing up . No swelling in face and hands  Zone 2: CAUTION Call your doctor's office for any of the following:  . BP reading is greater than 140 (top number) or greater than 90 (bottom number)  . Stomach pain under your ribs in the middle or right side . Headaches or seeing spots . Feeling nauseated or throwing up . Swelling in face and hands  Zone 3: EMERGENCY  Seek immediate medical care if you have any of  the following:  . BP reading is greater than160 (top number) or greater than 110 (bottom number) . Severe headaches not improving with Tylenol . Serious difficulty catching your breath . Any worsening symptoms from Zone 2   Braxton Hicks Contractions Contractions of the uterus can occur throughout pregnancy, but they are not always a sign that you are in labor. You may have practice contractions called Braxton Hicks contractions. These false labor contractions are sometimes confused with true labor. What are Braxton Hicks contractions? Braxton Hicks contractions are tightening movements that occur in the muscles of the uterus before labor. Unlike true labor contractions, these  contractions do not result in opening (dilation) and thinning of the cervix. Toward the end of pregnancy (32-34 weeks), Braxton Hicks contractions can happen more often and may become stronger. These contractions are sometimes difficult to tell apart from true labor because they can be very uncomfortable. You should not feel embarrassed if you go to the hospital with false labor. Sometimes, the only way to tell if you are in true labor is for your health care provider to look for changes in the cervix. The health care provider will do a physical exam and may monitor your contractions. If you are not in true labor, the exam should show that your cervix is not dilating and your water has not broken. If there are no other health problems associated with your pregnancy, it is completely safe for you to be sent home with false labor. You may continue to have Braxton Hicks contractions until you go into true labor. How to tell the difference between true labor and false labor True labor  Contractions last 30-70 seconds.  Contractions become very regular.  Discomfort is usually felt in the top of the uterus, and it spreads to the lower abdomen and low back.  Contractions do not go away with walking.  Contractions usually become more intense and increase in frequency.  The cervix dilates and gets thinner. False labor  Contractions are usually shorter and not as strong as true labor contractions.  Contractions are usually irregular.  Contractions are often felt in the front of the lower abdomen and in the groin.  Contractions may go away when you walk around or change positions while lying down.  Contractions get weaker and are shorter-lasting as time goes on.  The cervix usually does not dilate or become thin. Follow these instructions at home:  1. Take over-the-counter and prescription medicines only as told by your health care provider. 2. Keep up with your usual exercises and follow other  instructions from your health care provider. 3. Eat and drink lightly if you think you are going into labor. 4. If Braxton Hicks contractions are making you uncomfortable: ? Change your position from lying down or resting to walking, or change from walking to resting. ? Sit and rest in a tub of warm water. ? Drink enough fluid to keep your urine pale yellow. Dehydration may cause these contractions. ? Do slow and deep breathing several times an hour. 5. Keep all follow-up prenatal visits as told by your health care provider. This is important. Contact a health care provider if:  You have a fever.  You have continuous pain in your abdomen. Get help right away if:  Your contractions become stronger, more regular, and closer together.  You have fluid leaking or gushing from your vagina.  You pass blood-tinged mucus (bloody show).  You have bleeding from your vagina.  You have low back   pain that you never had before.  You feel your baby's head pushing down and causing pelvic pressure.  Your baby is not moving inside you as much as it used to. Summary  Contractions that occur before labor are called Braxton Hicks contractions, false labor, or practice contractions.  Braxton Hicks contractions are usually shorter, weaker, farther apart, and less regular than true labor contractions. True labor contractions usually become progressively stronger and regular, and they become more frequent.  Manage discomfort from Braxton Hicks contractions by changing position, resting in a warm bath, drinking plenty of water, or practicing deep breathing. This information is not intended to replace advice given to you by your health care provider. Make sure you discuss any questions you have with your health care provider. Document Revised: 09/06/2017 Document Reviewed: 02/07/2017 Elsevier Patient Education  2020 [employer]    

## 2020-05-16 ENCOUNTER — Ambulatory Visit (INDEPENDENT_AMBULATORY_CARE_PROVIDER_SITE_OTHER): Payer: Medicaid Other | Admitting: Obstetrics & Gynecology

## 2020-05-16 ENCOUNTER — Encounter: Payer: Self-pay | Admitting: Obstetrics & Gynecology

## 2020-05-16 ENCOUNTER — Other Ambulatory Visit: Payer: Medicaid Other | Admitting: Advanced Practice Midwife

## 2020-05-16 ENCOUNTER — Telehealth (HOSPITAL_COMMUNITY): Payer: Self-pay | Admitting: *Deleted

## 2020-05-16 ENCOUNTER — Encounter (HOSPITAL_COMMUNITY): Payer: Self-pay | Admitting: *Deleted

## 2020-05-16 ENCOUNTER — Ambulatory Visit (INDEPENDENT_AMBULATORY_CARE_PROVIDER_SITE_OTHER): Payer: Medicaid Other | Admitting: *Deleted

## 2020-05-16 VITALS — BP 134/88 | HR 110 | Wt 245.0 lb

## 2020-05-16 DIAGNOSIS — Z1389 Encounter for screening for other disorder: Secondary | ICD-10-CM

## 2020-05-16 DIAGNOSIS — Z3403 Encounter for supervision of normal first pregnancy, third trimester: Secondary | ICD-10-CM | POA: Diagnosis not present

## 2020-05-16 DIAGNOSIS — O480 Post-term pregnancy: Secondary | ICD-10-CM | POA: Diagnosis not present

## 2020-05-16 DIAGNOSIS — Z331 Pregnant state, incidental: Secondary | ICD-10-CM | POA: Diagnosis not present

## 2020-05-16 DIAGNOSIS — Z3A40 40 weeks gestation of pregnancy: Secondary | ICD-10-CM | POA: Diagnosis not present

## 2020-05-16 LAB — POCT URINALYSIS DIPSTICK OB
Blood, UA: NEGATIVE
Glucose, UA: NEGATIVE
Ketones, UA: NEGATIVE
Leukocytes, UA: NEGATIVE
Nitrite, UA: NEGATIVE
POC,PROTEIN,UA: NEGATIVE

## 2020-05-16 NOTE — Progress Notes (Signed)
LOW-RISK PREGNANCY VISIT Patient name: Bethany Barrera MRN [id number]  Date of birth: [birth year] Chief Complaint:   Routine Prenatal Visit (NST)  History of Present Illness:   Bethany Barrera is a 24 y.o. G1P0 female at [redacted]w[redacted]d with an Estimated Date of Delivery: 05/14/20 being seen today for ongoing management of a low-risk pregnancy.  Depression screen [facility] 2/9 03/02/2020 02/05/2020 11/07/2017  Decreased Interest 1 1 1   Down, Depressed, Hopeless 0 0 1  PHQ - 2 Score 1 1 2   Altered sleeping 0 1 0  Tired, decreased energy 1 1 1   Change in appetite 0 0 0  Feeling bad or failure about yourself  0 0 0  Trouble concentrating 0 0 0  Moving slowly or fidgety/restless 0 0 0  Suicidal thoughts 0 0 0  PHQ-9 Score 2 3 3   Difficult doing work/chores Not difficult at all - Somewhat difficult    [name] Contractions: Not present. Vag. Bleeding: None.  Movement: Present. denies leaking of fluid. Review of Systems:   Pertinent items are noted in HPI Denies abnormal vaginal discharge w/ itching/odor/irritation, headaches, visual changes, shortness of breath, chest pain, abdominal pain, severe nausea/vomiting, or problems with urination or bowel movements unless otherwise stated above. Pertinent History Reviewed:  Reviewed past medical,surgical, social, obstetrical and family history.  Reviewed problem list, medications and allergies. Physical Assessment:   Vitals:   05/16/20 1006  BP: 134/88  Pulse: (!) 110  Weight: 245 lb (111.1 kg)  Body mass index is 40.15 kg/m.        Physical Examination:   General appearance: Well appearing, and in no distress  Mental status: Alert, oriented to person, place, and time  Skin: Warm & dry  Cardiovascular: Normal heart rate noted  Respiratory: Normal respiratory effort, no distress  Abdomen: Soft, gravid, nontender  Pelvic: Cervical exam deferred         Extremities: Edema: None  Fetal Status:     Movement: Present     Chaperone: n/a    Results for orders placed or performed in visit on 05/16/20 (from the past 24 hour(s))  POC Urinalysis Dipstick OB   Collection Time: 05/16/20 10:05 AM  Result Value Ref Range   Color, UA     Clarity, UA     Glucose, UA Negative Negative   Bilirubin, UA     Ketones, UA n    Spec Grav, UA     Blood, UA n    pH, UA     POC,PROTEIN,UA Negative Negative, Trace, Small (1+), Moderate (2+), Large (3+), 4+   Urobilinogen, UA     Nitrite, UA n    Leukocytes, UA Negative Negative   Appearance     Odor      Assessment & Plan:  1) Low-risk pregnancy G1P0 at [redacted]w[redacted]d with an Estimated Date of Delivery: 05/14/20   2) post term pregnancy IOL 41 weeks set up, pt declined SVE today   Meds: No orders of the defined types were placed in this encounter.  Labs/procedures today: reactive NSTR  Plan:  Continue routine obstetrical care  Next visit: prefers in person    Reviewed: Term labor symptoms and general obstetric precautions including but not limited to vaginal bleeding, contractions, leaking of fluid and fetal movement were reviewed in detail with the patient.  All questions were answered. Has home bp cuff. Rx faxed to . Check bp weekly, let 07/16/20 know if >140/90.   Follow-up: No follow-ups on file.  No orders of the defined types were placed in this encounter.  Lazaro Arms  05/16/2020 10:51 AM

## 2020-05-16 NOTE — Treatment Plan (Signed)
   Induction Assessment Scheduling Form: [facility]ax to [facility]:  [phone]  Bethany Barrera                                                                                   DOB:  July 30, 1996                                                            MRN:  [id number]                                                                     Phone #:                            Provider:  [facility]  GP:  G1P0                                                            Estimated Date of Delivery: 05/14/20  Dating Criteria: 2nd trimester sonogram    Medical Indications for induction:  Post term 41 weeks Admission Date/Time:  05/20/20@2345  Gestational age on admission:  [redacted]w[redacted]d   Filed Weights   05/16/20 1006  Weight: 245 lb (111.1 kg)   HIV:  Non Reactive (06/02 0849) GBS: --[name] (07/13 0000)  Cervix not examined by pt request   Method of induction(proposed):  choice   Scheduling Provider Signature:  Lazaro Arms, MD                                            Today's Date:  05/16/2020

## 2020-05-16 NOTE — Telephone Encounter (Signed)
Preadmission screen  

## 2020-05-18 ENCOUNTER — Other Ambulatory Visit: Payer: Self-pay | Admitting: Advanced Practice Midwife

## 2020-05-19 ENCOUNTER — Other Ambulatory Visit (HOSPITAL_COMMUNITY)
Admission: RE | Admit: 2020-05-19 | Discharge: 2020-05-19 | Disposition: A | Discharge status: Home / Self Care | Payer: Medicaid Other | Source: Ambulatory Visit | Attending: Family Medicine | Admitting: Family Medicine

## 2020-05-19 DIAGNOSIS — Z01812 Encounter for preprocedural laboratory examination: Secondary | ICD-10-CM | POA: Insufficient documentation

## 2020-05-19 DIAGNOSIS — Z20822 Contact with and (suspected) exposure to covid-19: Secondary | ICD-10-CM | POA: Insufficient documentation

## 2020-05-19 LAB — SARS CORONAVIRUS 2 (TAT 6-24 HRS): SARS Coronavirus 2: NEGATIVE

## 2020-05-20 ENCOUNTER — Inpatient Hospital Stay (HOSPITAL_COMMUNITY)
Admission: AD | Admit: 2020-05-20 | Discharge: 2020-05-24 | DRG: 788 | Disposition: A | Discharge status: Home / Self Care | Payer: Medicaid Other | Attending: Obstetrics and Gynecology | Admitting: Obstetrics and Gynecology

## 2020-05-20 ENCOUNTER — Encounter (HOSPITAL_COMMUNITY): Payer: Self-pay | Admitting: Obstetrics and Gynecology

## 2020-05-20 ENCOUNTER — Inpatient Hospital Stay (HOSPITAL_COMMUNITY): Payer: Medicaid Other | Admitting: Anesthesiology

## 2020-05-20 ENCOUNTER — Ambulatory Visit: Payer: Self-pay

## 2020-05-20 ENCOUNTER — Other Ambulatory Visit: Payer: Self-pay

## 2020-05-20 DIAGNOSIS — Z20822 Contact with and (suspected) exposure to covid-19: Secondary | ICD-10-CM | POA: Diagnosis present

## 2020-05-20 DIAGNOSIS — O0932 Supervision of pregnancy with insufficient antenatal care, second trimester: Secondary | ICD-10-CM

## 2020-05-20 DIAGNOSIS — O480 Post-term pregnancy: Secondary | ICD-10-CM | POA: Diagnosis present

## 2020-05-20 DIAGNOSIS — O34211 Maternal care for low transverse scar from previous cesarean delivery: Secondary | ICD-10-CM | POA: Diagnosis not present

## 2020-05-20 DIAGNOSIS — O26893 Other specified pregnancy related conditions, third trimester: Secondary | ICD-10-CM | POA: Diagnosis present

## 2020-05-20 DIAGNOSIS — Z3A40 40 weeks gestation of pregnancy: Secondary | ICD-10-CM | POA: Diagnosis not present

## 2020-05-20 DIAGNOSIS — O99214 Obesity complicating childbirth: Secondary | ICD-10-CM | POA: Diagnosis present

## 2020-05-20 DIAGNOSIS — O324XX0 Maternal care for high head at term, not applicable or unspecified: Secondary | ICD-10-CM | POA: Diagnosis not present

## 2020-05-20 DIAGNOSIS — Z3403 Encounter for supervision of normal first pregnancy, third trimester: Secondary | ICD-10-CM

## 2020-05-20 DIAGNOSIS — Z880 Allergy status to penicillin: Secondary | ICD-10-CM

## 2020-05-20 DIAGNOSIS — O99824 Streptococcus B carrier state complicating childbirth: Secondary | ICD-10-CM | POA: Diagnosis present

## 2020-05-20 DIAGNOSIS — Z3A41 41 weeks gestation of pregnancy: Secondary | ICD-10-CM | POA: Diagnosis not present

## 2020-05-20 LAB — CBC
HCT: 35.8 % — ABNORMAL LOW (ref 36.0–46.0)
Hemoglobin: 11.5 g/dL — ABNORMAL LOW (ref 12.0–15.0)
MCH: 25.2 pg — ABNORMAL LOW (ref 26.0–34.0)
MCHC: 32.1 g/dL (ref 30.0–36.0)
MCV: 78.3 fL — ABNORMAL LOW (ref 80.0–100.0)
Platelets: 311 10*3/uL (ref 150–400)
RBC: 4.57 MIL/uL (ref 3.87–5.11)
RDW: 14.1 % (ref 11.5–15.5)
WBC: 7.4 10*3/uL (ref 4.0–10.5)
nRBC: 0 % (ref 0.0–0.2)

## 2020-05-20 LAB — TYPE AND SCREEN
ABO/RH(D): O POS
Antibody Screen: NEGATIVE

## 2020-05-20 MED ORDER — EPHEDRINE 5 MG/ML INJ: 10.0000 mg | INTRAVENOUS | Status: DC | PRN

## 2020-05-20 MED ORDER — LIDOCAINE HCL (PF) 1 % IJ SOLN
INTRAMUSCULAR | Status: DC | PRN
  Administered 2020-05-20: 5 mL via EPIDURAL
  Administered 2020-05-20: 4 mL via EPIDURAL

## 2020-05-20 MED ORDER — OXYCODONE-ACETAMINOPHEN 5-325 MG PO TABS: 1.0000 | ORAL_TABLET | ORAL | Status: DC | PRN

## 2020-05-20 MED ORDER — FENTANYL-BUPIVACAINE-NACL 0.5-0.125-0.9 MG/250ML-% EP SOLN
12.0000 mL/h | EPIDURAL | Status: DC | PRN
  Filled 2020-05-20: qty 250

## 2020-05-20 MED ORDER — LACTATED RINGERS IV SOLN: 500.0000 mL | Freq: Once | INTRAVENOUS | Status: DC

## 2020-05-20 MED ORDER — ONDANSETRON HCL 4 MG/2ML IJ SOLN: 4.0000 mg | Freq: Four times a day (QID) | INTRAMUSCULAR | Status: DC | PRN

## 2020-05-20 MED ORDER — OXYTOCIN BOLUS FROM INFUSION: 333.0000 mL | Freq: Once | INTRAVENOUS | Status: DC

## 2020-05-20 MED ORDER — PENICILLIN G POT IN DEXTROSE 60000 UNIT/ML IV SOLN
3.0000 10*6.[IU] | INTRAVENOUS | Status: DC
  Administered 2020-05-20 – 2020-05-21 (×2): 3 10*6.[IU] via INTRAVENOUS
  Filled 2020-05-20 (×8): qty 50

## 2020-05-20 MED ORDER — PHENYLEPHRINE 40 MCG/ML (10ML) SYRINGE FOR IV PUSH (FOR BLOOD PRESSURE SUPPORT): 80.0000 ug | PREFILLED_SYRINGE | INTRAVENOUS | Status: DC | PRN

## 2020-05-20 MED ORDER — SOD CITRATE-CITRIC ACID 500-334 MG/5ML PO SOLN
30.0000 mL | ORAL | Status: DC | PRN
  Administered 2020-05-21: 30 mL via ORAL
  Filled 2020-05-20: qty 30

## 2020-05-20 MED ORDER — OXYCODONE-ACETAMINOPHEN 5-325 MG PO TABS: 2.0000 | ORAL_TABLET | ORAL | Status: DC | PRN

## 2020-05-20 MED ORDER — LACTATED RINGERS IV SOLN: INTRAVENOUS | Status: DC

## 2020-05-20 MED ORDER — OXYTOCIN-SODIUM CHLORIDE 30-0.9 UT/500ML-% IV SOLN
1.0000 m[IU]/min | INTRAVENOUS | Status: DC
  Administered 2020-05-20 – 2020-05-21 (×2): 2 m[IU]/min via INTRAVENOUS
  Filled 2020-05-20: qty 500

## 2020-05-20 MED ORDER — OXYTOCIN-SODIUM CHLORIDE 30-0.9 UT/500ML-% IV SOLN: 2.5000 [IU]/h | INTRAVENOUS | Status: DC

## 2020-05-20 MED ORDER — SODIUM CHLORIDE 0.9 % IV SOLN
5.0000 10*6.[IU] | Freq: Once | INTRAVENOUS | Status: AC
  Administered 2020-05-20: 5 10*6.[IU] via INTRAVENOUS
  Filled 2020-05-20: qty 5

## 2020-05-20 MED ORDER — PENICILLIN G POT IN DEXTROSE 60000 UNIT/ML IV SOLN: 3.0000 10*6.[IU] | INTRAVENOUS | Status: DC

## 2020-05-20 MED ORDER — LIDOCAINE HCL (PF) 1 % IJ SOLN: 30.0000 mL | INTRAMUSCULAR | Status: DC | PRN

## 2020-05-20 MED ORDER — LACTATED RINGERS IV SOLN
500.0000 mL | INTRAVENOUS | Status: DC | PRN
  Administered 2020-05-21: 500 mL via INTRAVENOUS

## 2020-05-20 MED ORDER — SODIUM CHLORIDE 0.9 % IV SOLN
2.0000 g | Freq: Once | INTRAVENOUS | Status: AC
  Administered 2020-05-20: 2 g via INTRAVENOUS
  Filled 2020-05-20: qty 2000

## 2020-05-20 MED ORDER — TERBUTALINE SULFATE 1 MG/ML IJ SOLN
0.2500 mg | Freq: Once | INTRAMUSCULAR | Status: AC | PRN
  Administered 2020-05-21: 0.25 mg via SUBCUTANEOUS
  Filled 2020-05-20: qty 1

## 2020-05-20 MED ORDER — ACETAMINOPHEN 325 MG PO TABS: 650.0000 mg | ORAL_TABLET | ORAL | Status: DC | PRN

## 2020-05-20 MED ORDER — MISOPROSTOL 25 MCG QUARTER TABLET
25.0000 ug | ORAL_TABLET | ORAL | Status: DC | PRN
  Filled 2020-05-20: qty 1

## 2020-05-20 MED ORDER — SODIUM CHLORIDE (PF) 0.9 % IJ SOLN
INTRAMUSCULAR | Status: DC | PRN
  Administered 2020-05-20: 12 mL/h via EPIDURAL

## 2020-05-20 MED ORDER — DIPHENHYDRAMINE HCL 50 MG/ML IJ SOLN: 12.5000 mg | INTRAMUSCULAR | Status: DC | PRN

## 2020-05-20 NOTE — MAU Note (Signed)
Pt states she woke up around 0330 and could not go back to sleep she was having contractions that have gotten stronger and closer together. Denies VB, states she has had some clear discharge. Reports good fetal movement.

## 2020-05-20 NOTE — Anesthesia Preprocedure Evaluation (Signed)
Anesthesia Evaluation  Patient identified by MRN, date of birth, ID band Patient awake    Reviewed: Allergy & Precautions, NPO status , Patient's Chart, lab work & pertinent test results  History of Anesthesia Complications Negative for: history of anesthetic complications  Airway Mallampati: II   Neck ROM: Full    Dental   Pulmonary neg pulmonary ROS,    Pulmonary exam normal        Cardiovascular negative cardio ROS Normal cardiovascular exam     Neuro/Psych PSYCHIATRIC DISORDERS Anxiety negative neurological ROS     GI/Hepatic Neg liver ROS, GERD  Medicated and Controlled,  Endo/Other  Morbid obesity  Renal/GU negative Renal ROS     Musculoskeletal negative musculoskeletal ROS (+)   Abdominal (+) + obese,   Peds  Hematology  (+) anemia ,  Plt 311k    Anesthesia Other Findings Covid test negative   Reproductive/Obstetrics (+) Pregnancy                             Anesthesia Physical Anesthesia Plan  ASA: III  Anesthesia Plan: Epidural   Post-op Pain Management:    Induction:   PONV Risk Score and Plan: 2 and Treatment may vary due to age or medical condition  Airway Management Planned: Natural Airway  Additional Equipment: None  Intra-op Plan:   Post-operative Plan:   Informed Consent: I have reviewed the patients History and Physical, chart, labs and discussed the procedure including the risks, benefits and alternatives for the proposed anesthesia with the patient or authorized representative who has indicated his/her understanding and acceptance.       Plan Discussed with: Anesthesiologist  Anesthesia Plan Comments: (Labs reviewed. Platelets acceptable, patient not taking any blood thinning medications. Per RN, FHR tracing reported to be stable enough for sitting procedure. Risks and benefits discussed with patient, including PDPH, backache, epidural hematoma,  failed epidural, blood pressure changes, allergic reaction, and nerve injury. Patient expressed understanding and wished to proceed.)        Anesthesia Quick Evaluation

## 2020-05-20 NOTE — Telephone Encounter (Signed)
Pt. Reports she 1 week past her due date. Started having contractions at 0300. Contractions every 5 minutes or longer. Instructed needs to notify her OB/GYN and go to [facility] Martorell understanding.  Reason for Disposition . [1] First baby (primipara) AND [2] contractions < 6 minutes apart  AND [3] present 2 hours  Answer Assessment - Initial Assessment Questions 1. ONSET: "When did the symptoms begin?"        Started 0300 2. CONTRACTIONS: "Describe the contractions that you are having." (e.g., duration, frequency, regularity, severity)     Every 4-5 minutes 3. EDD: "What date are you expecting to deliver?"     1 week over 4. PARITY: "Have you had a baby before?" If Yes, ask: "How long did the labor last?"     First baby 5. FETAL MOVEMENT: "Has the baby's movement decreased or changed significantly from normal?"     No changes 6. OTHER SYMPTOMS: "Do you have any other symptoms?" (e.g., leaking fluid from vagina, vaginal bleeding, fever, hand/facial swelling)     No  Protocols used: PREGNANCY - LABOR-A-AH

## 2020-05-20 NOTE — Progress Notes (Signed)
   Bethany Barrera is a 24 y.o. G1P0 at [redacted]w[redacted]d  admitted for cervical change and plan for IOL at midnight tonight.   Subjective: Feeling tired, has epidural in place  Objective: Vitals:   05/20/20 1630 05/20/20 1700 05/20/20 1730 05/20/20 1800  BP: 138/86 121/63 120/76 123/79  Pulse: 99 (!) 103 91 91  Resp: 18 18 18 16   Temp:   97.7 F (36.5 C)   TempSrc:   Oral   SpO2:       No intake/output data recorded.  FHT:  FHR: 140 bpm, variability: moderate,  accelerations:  Present,  decelerations:  Absent UC:   irregular, every 2-3 minutes SVE:   Dilation: 5.5 Effacement (%): 70 Station: -1 Exam by:: [email or web address] RN Pitocin @ 2  mu/min  Labs: Lab Results  Component Value Date   WBC 7.4 05/20/2020   HGB 11.5 (L) 05/20/2020   HCT 35.8 (L) 05/20/2020   MCV 78.3 (L) 05/20/2020   PLT 311 05/20/2020    Assessment / Plan: moving into active labor, will continue to titrate pitocin and then AROM  Labor: active labor Fetal Wellbeing:  Category I Pain Control:  Epidural Anticipated MOD:  NSVD  05/22/2020 05/20/2020, 6:34 PM

## 2020-05-20 NOTE — Progress Notes (Signed)
Patient Bethany Barrera is a 24 y.o.  G1P0  at [redacted]w[redacted]d here for IOL for postdates. She reports a PCN allergy but cannot remember what it was. Patient does not remember if she ever had rash, hives or difficulty breathing.  When speaking to mother at beddside, mother is uncertain if patient truly had a PCN allergy or if it was another sibling. Patient's mother says that she always  Made sure patient had "amoxicillin" as a child due to her PCN allergy. Patient's mother denies that patient ever had rash, hives or difficulty breathing after taking PCN in childhood.  Given that patient has tolerated Ampicillin intrapartum and has taken amoxicillin in the past, Bethany Barrera recommends continuing with PCN protocol and will watch for reaction. Patient and mother are ok with this plan.   Bethany Barrera

## 2020-05-20 NOTE — H&P (Signed)
Bethany Barrera is a 24 y.o. female G1P0 with IUP at [redacted]w[redacted]d by  presenting for contractions.  She reports positive fetal movement. She denies leakage of fluid, other than clear fluid,  or vaginal bleeding. She is scheduled for IOL for postdates tonight at midnight.    Prenatal History/Complications: PNC at [facility] since 29 weeks; she was late to prenatal care because she did not know she was pregnant.  Pregnancy complications:  Late to prenatal care, limited care in third trimester.  PCN allergy, although patient does not know what her allergy is.   - Past Medical History: Past Medical History:  Diagnosis Date  . Anxiety   . Gastritis   . GERD (gastroesophageal reflux disease)   . Muscle weakness   . Seasonal allergies     Past Surgical History: Past Surgical History:  Procedure Laterality Date  . NO PAST SURGERIES      Obstetrical History: OB History    Gravida  1   Para      Term      Preterm      AB      Living        SAB      TAB      Ectopic      Multiple      Live Births               Social History: Social History   Socioeconomic History  . Marital status: Single    Spouse name: Not on file  . Number of children: 0  . Years of education: 27  . Highest education level: Not on file  Occupational History  . Occupation: Consulting civil engineer  Tobacco Use  . Smoking status: Never Smoker  . Smokeless tobacco: Never Used  Vaping Use  . Vaping Use: Never used  Substance and Sexual Activity  . Alcohol use: No  . Drug use: No  . Sexual activity: Yes    Partners: Male    Birth control/protection: None  Other Topics Concern  . Not on file  Social History Narrative   Lives at home with mother and father.   Right-handed.   No caffeine use.   Social Determinants of Health   Financial Resource Strain: Low Risk   . Difficulty of Paying Living Expenses: Not hard at all  Food Insecurity: No Food Insecurity  . Worried About Programme researcher, broadcasting/film/video in the  Last Year: Never true  . Ran Out of Food in the Last Year: Never true  Transportation Needs: No Transportation Needs  . Lack of Transportation (Medical): No  . Lack of Transportation (Non-Medical): No  Physical Activity: Insufficiently Active  . Days of Exercise per Week: 2 days  . Minutes of Exercise per Session: 20 min  Stress: No Stress Concern Present  . Feeling of Stress : Only a little  Social Connections: Moderately Isolated  . Frequency of Communication with Friends and Family: More than three times a week  . Frequency of Social Gatherings with Friends and Family: Twice a week  . Attends Religious Services: More than 4 times per year  . Active Member of Clubs or Organizations: No  . Attends Banker Meetings: Never  . Marital Status: Never married    Family History: Family History  Problem Relation Age of Onset  . Clotting disorder Maternal Grandfather   . Diabetes Maternal Grandfather   . Heart disease Maternal Grandfather   . Breast cancer Paternal Grandmother   . Diabetes  Mother   . Other Mother        gallstones  . Hypertension Mother   . Heart disease Mother   . Hyperlipidemia Father   . Other Sister        pre-diabetes  . Other Sister        gallstones    Allergies: Allergies  Allergen Reactions  . Sulfa Antibiotics Shortness Of Breath and Other (See Comments)    tremors  . Tramadol Other (See Comments)    headaches  . Penicillin G Other (See Comments)    Childhood, pt states she cant remember what reaction she had; took amoxicillin w/o problem    Medications Prior to Admission  Medication Sig Dispense Refill Last Dose  . acetaminophen (TYLENOL) 325 MG tablet Take 650 mg by mouth daily as needed for mild pain or headache.   05/20/2020 at Unknown time  . esomeprazole (NEXIUM) 40 MG capsule TAKE 1 CAPSULE BY MOUTH EVERY DAY 20 MINUTES BEFORE BREAKFAST (Patient taking differently: Take 40 mg by mouth daily. ) 30 capsule 12 05/19/2020 at Unknown  time  . Prenatal Vit-Fe Fumarate-FA (PRENATAL VITAMIN PO) Take by mouth.   05/19/2020 at Unknown time  . Blood Pressure Monitor MISC For regular home bp monitoring during pregnancy 1 each 0     Review of Systems   Constitutional: Negative for fever and chills Eyes: Negative for visual disturbances Respiratory: Negative for shortness of breath, dyspnea Cardiovascular: Negative for chest pain or palpitations  Gastrointestinal: Negative for vomiting, diarrhea and constipation.  POSITIVE for abdominal pain (contractions) Genitourinary: Negative for dysuria and urgency Musculoskeletal: Negative for back pain, joint pain, myalgias  Neurological: Negative for dizziness and headaches  Blood pressure 128/77, pulse 88, temperature 98.5 F (36.9 C), temperature source Oral, resp. rate 18, last menstrual period 07/24/2019, SpO2 99 %. General appearance: alert, cooperative and no distress Lungs: normal respiratory effort Heart: regular rate and rhythm Abdomen: soft, non-tender; bowel sounds normal Extremities: Homans sign is negative, no sign of DVT DTR's 2+ Presentation: cephalic Fetal monitoring  Baseline: 140 bpm; mod var, present acels,  Uterine activity  Irregular contractions Dilation: 4 Effacement (%): 70 Station: -1 Exam by:: Exie Parody, RN   Prenatal labs: ABO, Rh: --/--/O POS (08/13 1308) Antibody: NEG (08/13 1308) Rubella: 3.32 (05/26 1201) RPR: Non Reactive (06/02 0849)  HBsAg: Negative (05/26 1201)  HIV: Non Reactive (06/02 0849)  GBS: --/Positive (07/13 0000)  1 hr Glucola passed 72 95 106 Genetic screening  Normal NIPS, AFP not done Anatomy US normal female; US done at 26 weeks.   Prenatal Transfer Tool  Maternal Diabetes: No Genetic Screening: NIPS normal female; AFP not done Maternal Ultrasounds/Referrals: Normal Fetal Ultrasounds or other Referrals:  Other: normal anatomy scan Maternal Substance Abuse:  No Significant Maternal Medications:  None Significant  Maternal Lab Results: Group B Strep positive  Results for orders placed or performed during the hospital encounter of 05/20/20 (from the past 24 hour(s))  CBC   Collection Time: 05/20/20  1:08 PM  Result Value Ref Range   WBC 7.4 4.0 - 10.5 K/uL   RBC 4.57 3.87 - 5.11 MIL/uL   Hemoglobin 11.5 (L) 12.0 - 15.0 g/dL   HCT 01.0 (L) 36 - 46 %   MCV 78.3 (L) 80.0 - 100.0 fL   MCH 25.2 (L) 26.0 - 34.0 pg   MCHC 32.1 30.0 - 36.0 g/dL   RDW 07.1 21.9 - 75.8 %   Platelets 311 150 - 400 K/uL  nRBC 0.0 0.0 - 0.2 %  Type and screen   Collection Time: 05/20/20  1:08 PM  Result Value Ref Range   ABO/RH(D) O POS    Antibody Screen NEG    Sample Expiration      05/23/2020,2359 Performed at [facility], [address] [place], [place] [address]     Assessment: Bethany Barrera is a 24 y.o. G1P0 with an IUP at [redacted]w[redacted]d presenting for labor. She is scheduled for IOL tonight; has made change while in MAU. Will watch labor pattern; if necessary, will start pitocin and AROM.   Plan: #Labor: expectant management #Pain:  Per request #FWB Cat 1 #ID: GBS: positive-patient cannot remember what her allergy was to PCN and has tolerated 2 grams of ampicillin on labor and so will now switch back to PCN 5 grams then q4 per pharmacy consutl #MOF:  bottle #MOC: COCs #Circ:  NA   Charlesetta Garibaldi Bethany Barrera 05/20/2020, 2:47 PM

## 2020-05-20 NOTE — Anesthesia Procedure Notes (Signed)
Epidural Patient location during procedure: OB Start time: 05/20/2020 2:22 PM End time: 05/20/2020 2:27 PM  Staffing Anesthesiologist: Beryle Lathe, MD Performed: anesthesiologist   Preanesthetic Checklist Completed: patient identified, IV checked, risks and benefits discussed, monitors and equipment checked, pre-op evaluation and timeout performed  Epidural Patient position: sitting Prep: DuraPrep Patient monitoring: continuous pulse ox and blood pressure Approach: midline Location: L2-L3 Injection technique: LOR saline  Needle:  Needle type: Tuohy  Needle gauge: 17 G Needle length: 9 cm Needle insertion depth: 9 cm Catheter size: 19 Gauge Catheter at skin depth: 15 cm Test dose: negative and Other (1% lidocaine)  Assessment Events: blood not aspirated  Additional Notes Patient identified. Risks including, but not limited to, bleeding, infection, nerve damage, paralysis, inadequate analgesia, blood pressure changes, nausea, vomiting, allergic reaction, postpartum back pain, itching, and headache were discussed. Patient expressed understanding and wished to proceed. Sterile prep and drape, including hand hygiene, mask, and sterile gloves were used. The patient was positioned and the spine was prepped. The skin was anesthetized with lidocaine. No paraesthesia or other complication noted. The patient did not experience any signs of intravascular injection such as tinnitus or metallic taste in mouth, nor signs of intrathecal spread such as rapid motor block. Please see nursing notes for vital signs. The patient tolerated the procedure well.   Leslye Peer, MDReason for block:procedure for pain

## 2020-05-20 NOTE — Progress Notes (Signed)
LABOR PROGRESS NOTE  Bethany Barrera is a 24 y.o. G1P0 at [redacted]w[redacted]d  admitted for early labor, was scheduled for IOL at [facility]   Subjective: Patient doing well, comfortable with epidural   Objective: BP 129/84   Pulse 93   Temp 98.4 F (36.9 C) (Oral)   Resp 18   LMP 07/24/2019 (Within Days)   SpO2 99%  or  Vitals:   05/20/20 1800 05/20/20 1830 05/20/20 1900 05/20/20 2016  BP: 123/79 132/72 129/84   Pulse: 91 92 93   Resp: 16 16 18    Temp:    98.4 F (36.9 C)  TempSrc:    Oral  SpO2:        Currently on 67milli-unit/min of pitocin  Dilation: 5.5 Effacement (%): 60 Cervical Position: Middle Station: -1 Presentation: Vertex Exam by:: [email or web address] RN  FHT: baseline rate 135, moderate varibility, +accel, variable and early decel Toco: 2-3  Labs: Lab Results  Component Value Date   WBC 7.4 05/20/2020   HGB 11.5 (L) 05/20/2020   HCT 35.8 (L) 05/20/2020   MCV 78.3 (L) 05/20/2020   PLT 311 05/20/2020    Patient Active Problem List   Diagnosis Date Noted  . Post term pregnancy at [redacted] weeks gestation 05/20/2020  . Chronic pain w/ +ANA Antibody 03/02/2020  . Supervision of normal first pregnancy 02/29/2020  . Late prenatal care in second trimester 02/29/2020  . Anxiety 02/05/2020  . History of anxiety 07/29/2018  . Positive ANA (antinuclear antibody) 05/23/2018  . Vitamin D deficiency 02/25/2018  . Paroxysmal atrial tachycardia by electrocardiogram (HCC) 02/22/2016  . Paresthesia 01/11/2016  . GERD (gastroesophageal reflux disease)     Assessment / Plan: 24 y.o. G1P0 at [redacted]w[redacted]d here for early labor   Labor: Currently on pitocin for augmentation, if no cervical change at next examination will AROM and place IUPC. Continue to monitor variables closely.  Fetal Wellbeing:  Cat II  Pain Control:  Epidural  Anticipated MOD:  SVD  [redacted]w[redacted]d, CNM 05/20/2020, 10:19 PM

## 2020-05-21 ENCOUNTER — Encounter (HOSPITAL_COMMUNITY): Payer: Self-pay | Admitting: Obstetrics & Gynecology

## 2020-05-21 ENCOUNTER — Encounter (HOSPITAL_COMMUNITY)
Admission: AD | Disposition: A | Discharge status: Home / Self Care | Payer: Self-pay | Source: Home / Self Care | Attending: Obstetrics and Gynecology

## 2020-05-21 ENCOUNTER — Inpatient Hospital Stay (HOSPITAL_COMMUNITY): Admission: RE | Admit: 2020-05-21 | Payer: Medicaid Other | Source: Home / Self Care | Admitting: Family Medicine

## 2020-05-21 ENCOUNTER — Inpatient Hospital Stay (HOSPITAL_COMMUNITY): Payer: Medicaid Other

## 2020-05-21 DIAGNOSIS — O480 Post-term pregnancy: Secondary | ICD-10-CM

## 2020-05-21 DIAGNOSIS — O324XX0 Maternal care for high head at term, not applicable or unspecified: Secondary | ICD-10-CM

## 2020-05-21 DIAGNOSIS — Z3A41 41 weeks gestation of pregnancy: Secondary | ICD-10-CM

## 2020-05-21 DIAGNOSIS — O34211 Maternal care for low transverse scar from previous cesarean delivery: Secondary | ICD-10-CM

## 2020-05-21 LAB — CREATININE, SERUM
Creatinine, Ser: 0.75 mg/dL (ref 0.44–1.00)
GFR calc Af Amer: >60 mL/min (ref >60)
GFR calc non Af Amer: >60 mL/min (ref >60)

## 2020-05-21 LAB — RPR: RPR Ser Ql: NONREACTIVE

## 2020-05-21 SURGERY — Surgical Case
Anesthesia: Epidural

## 2020-05-21 MED ORDER — ZOLPIDEM TARTRATE 5 MG PO TABS: 5.0000 mg | ORAL_TABLET | Freq: Every evening | ORAL | Status: DC | PRN

## 2020-05-21 MED ORDER — DIPHENHYDRAMINE HCL 25 MG PO CAPS: 25.0000 mg | ORAL_CAPSULE | Freq: Four times a day (QID) | ORAL | Status: DC | PRN

## 2020-05-21 MED ORDER — TETANUS-DIPHTH-ACELL PERTUSSIS 5-2.5-18.5 LF-MCG/0.5 IM SUSP: 0.5000 mL | Freq: Once | INTRAMUSCULAR | Status: DC

## 2020-05-21 MED ORDER — MENTHOL 3 MG MT LOZG: 1.0000 | LOZENGE | OROMUCOSAL | Status: DC | PRN

## 2020-05-21 MED ORDER — SIMETHICONE 80 MG PO CHEW
80.0000 mg | CHEWABLE_TABLET | ORAL | Status: DC
  Administered 2020-05-21 – 2020-05-23 (×3): 80 mg via ORAL
  Filled 2020-05-21 (×3): qty 1

## 2020-05-21 MED ORDER — KETOROLAC TROMETHAMINE 30 MG/ML IJ SOLN
30.0000 mg | Freq: Four times a day (QID) | INTRAMUSCULAR | Status: AC
  Administered 2020-05-21 – 2020-05-22 (×4): 30 mg via INTRAVENOUS
  Filled 2020-05-21 (×4): qty 1

## 2020-05-21 MED ORDER — PRENATAL MULTIVITAMIN CH
1.0000 | ORAL_TABLET | Freq: Every day | ORAL | Status: DC
  Administered 2020-05-21 – 2020-05-24 (×4): 1 via ORAL
  Filled 2020-05-21 (×4): qty 1

## 2020-05-21 MED ORDER — ENOXAPARIN SODIUM 60 MG/0.6ML ~~LOC~~ SOLN
0.5000 mg/kg | SUBCUTANEOUS | Status: DC
  Administered 2020-05-22 – 2020-05-24 (×3): 55 mg via SUBCUTANEOUS
  Filled 2020-05-21 (×3): qty 0.6

## 2020-05-21 MED ORDER — FENTANYL CITRATE (PF) 100 MCG/2ML IJ SOLN
INTRAMUSCULAR | Status: AC
  Filled 2020-05-21: qty 4

## 2020-05-21 MED ORDER — PHENYLEPHRINE 40 MCG/ML (10ML) SYRINGE FOR IV PUSH (FOR BLOOD PRESSURE SUPPORT)
PREFILLED_SYRINGE | INTRAVENOUS | Status: AC
  Filled 2020-05-21: qty 10

## 2020-05-21 MED ORDER — DIPHENHYDRAMINE HCL 50 MG/ML IJ SOLN
INTRAMUSCULAR | Status: AC
  Filled 2020-05-21: qty 1

## 2020-05-21 MED ORDER — LACTATED RINGERS AMNIOINFUSION: INTRAVENOUS | Status: DC

## 2020-05-21 MED ORDER — LIDOCAINE-EPINEPHRINE (PF) 2 %-1:200000 IJ SOLN
INTRAMUSCULAR | Status: DC | PRN
  Administered 2020-05-21 (×2): 5 mg via INTRADERMAL

## 2020-05-21 MED ORDER — KETOROLAC TROMETHAMINE 30 MG/ML IJ SOLN: 30.0000 mg | Freq: Four times a day (QID) | INTRAMUSCULAR | Status: AC | PRN

## 2020-05-21 MED ORDER — COCONUT OIL OIL: 1.0000 "application " | TOPICAL_OIL | Status: DC | PRN

## 2020-05-21 MED ORDER — SIMETHICONE 80 MG PO CHEW
80.0000 mg | CHEWABLE_TABLET | Freq: Three times a day (TID) | ORAL | Status: DC
  Administered 2020-05-21 – 2020-05-24 (×9): 80 mg via ORAL
  Filled 2020-05-21 (×9): qty 1

## 2020-05-21 MED ORDER — DIPHENHYDRAMINE HCL 25 MG PO CAPS: 25.0000 mg | ORAL_CAPSULE | ORAL | Status: DC | PRN

## 2020-05-21 MED ORDER — ACETAMINOPHEN 500 MG PO TABS
1000.0000 mg | ORAL_TABLET | Freq: Four times a day (QID) | ORAL | Status: DC
  Administered 2020-05-21 – 2020-05-24 (×13): 1000 mg via ORAL
  Filled 2020-05-21 (×12): qty 2

## 2020-05-21 MED ORDER — DEXAMETHASONE SODIUM PHOSPHATE 10 MG/ML IJ SOLN
INTRAMUSCULAR | Status: AC
  Filled 2020-05-21: qty 1

## 2020-05-21 MED ORDER — LACTATED RINGERS IV SOLN: INTRAVENOUS | Status: DC

## 2020-05-21 MED ORDER — MORPHINE SULFATE (PF) 0.5 MG/ML IJ SOLN
INTRAMUSCULAR | Status: DC | PRN
  Administered 2020-05-21: 3 mg via EPIDURAL

## 2020-05-21 MED ORDER — FENTANYL CITRATE (PF) 100 MCG/2ML IJ SOLN
INTRAMUSCULAR | Status: DC | PRN
  Administered 2020-05-21: 100 ug via EPIDURAL

## 2020-05-21 MED ORDER — MORPHINE SULFATE (PF) 0.5 MG/ML IJ SOLN
INTRAMUSCULAR | Status: AC
  Filled 2020-05-21: qty 10

## 2020-05-21 MED ORDER — SCOPOLAMINE 1 MG/3DAYS TD PT72
MEDICATED_PATCH | TRANSDERMAL | Status: AC
  Filled 2020-05-21: qty 1

## 2020-05-21 MED ORDER — KETOROLAC TROMETHAMINE 30 MG/ML IJ SOLN
30.0000 mg | Freq: Once | INTRAMUSCULAR | Status: AC
  Administered 2020-05-21: 30 mg via INTRAVENOUS
  Filled 2020-05-21: qty 1

## 2020-05-21 MED ORDER — NALBUPHINE HCL 10 MG/ML IJ SOLN: 5.0000 mg | Freq: Once | INTRAMUSCULAR | Status: DC | PRN

## 2020-05-21 MED ORDER — METOCLOPRAMIDE HCL 5 MG/ML IJ SOLN
INTRAMUSCULAR | Status: AC
  Filled 2020-05-21: qty 2

## 2020-05-21 MED ORDER — OXYTOCIN-SODIUM CHLORIDE 30-0.9 UT/500ML-% IV SOLN
INTRAVENOUS | Status: AC
  Filled 2020-05-21: qty 500

## 2020-05-21 MED ORDER — IBUPROFEN 800 MG PO TABS
800.0000 mg | ORAL_TABLET | Freq: Four times a day (QID) | ORAL | Status: DC
  Administered 2020-05-22 – 2020-05-24 (×8): 800 mg via ORAL
  Filled 2020-05-21 (×8): qty 1

## 2020-05-21 MED ORDER — OXYTOCIN-SODIUM CHLORIDE 30-0.9 UT/500ML-% IV SOLN
2.5000 [IU]/h | INTRAVENOUS | Status: AC
  Administered 2020-05-21: 2.5 [IU]/h via INTRAVENOUS

## 2020-05-21 MED ORDER — OXYTOCIN-SODIUM CHLORIDE 30-0.9 UT/500ML-% IV SOLN
INTRAVENOUS | Status: DC | PRN
  Administered 2020-05-21: 30 [IU] via INTRAVENOUS

## 2020-05-21 MED ORDER — SENNOSIDES-DOCUSATE SODIUM 8.6-50 MG PO TABS
2.0000 | ORAL_TABLET | ORAL | Status: DC
  Administered 2020-05-21 – 2020-05-23 (×3): 2 via ORAL
  Filled 2020-05-21 (×3): qty 2

## 2020-05-21 MED ORDER — DEXMEDETOMIDINE (PRECEDEX) IN NS 20 MCG/5ML (4 MCG/ML) IV SYRINGE
PREFILLED_SYRINGE | INTRAVENOUS | Status: DC | PRN
  Administered 2020-05-21: 4 ug via INTRAVENOUS
  Administered 2020-05-21: 8 ug via INTRAVENOUS

## 2020-05-21 MED ORDER — NALOXONE HCL 0.4 MG/ML IJ SOLN: 0.4000 mg | INTRAMUSCULAR | Status: DC | PRN

## 2020-05-21 MED ORDER — NALOXONE HCL 4 MG/10ML IJ SOLN
1.0000 ug/kg/h | INTRAVENOUS | Status: DC | PRN
  Filled 2020-05-21: qty 5

## 2020-05-21 MED ORDER — DIBUCAINE (PERIANAL) 1 % EX OINT: 1.0000 "application " | TOPICAL_OINTMENT | CUTANEOUS | Status: DC | PRN

## 2020-05-21 MED ORDER — ONDANSETRON HCL 4 MG/2ML IJ SOLN
INTRAMUSCULAR | Status: AC
  Filled 2020-05-21: qty 2

## 2020-05-21 MED ORDER — MEPERIDINE HCL 25 MG/ML IJ SOLN: 6.2500 mg | INTRAMUSCULAR | Status: DC | PRN

## 2020-05-21 MED ORDER — FENTANYL CITRATE (PF) 100 MCG/2ML IJ SOLN: 25.0000 ug | INTRAMUSCULAR | Status: DC | PRN

## 2020-05-21 MED ORDER — NALBUPHINE HCL 10 MG/ML IJ SOLN: 5.0000 mg | INTRAMUSCULAR | Status: DC | PRN

## 2020-05-21 MED ORDER — WITCH HAZEL-GLYCERIN EX PADS: 1.0000 "application " | MEDICATED_PAD | CUTANEOUS | Status: DC | PRN

## 2020-05-21 MED ORDER — SODIUM CHLORIDE 0.9 % IV SOLN
INTRAVENOUS | Status: DC | PRN
Start: 2020-05-21 — End: 2020-05-21

## 2020-05-21 MED ORDER — KETOROLAC TROMETHAMINE 30 MG/ML IJ SOLN: 30.0000 mg | Freq: Once | INTRAMUSCULAR | Status: DC | PRN

## 2020-05-21 MED ORDER — LIDOCAINE HCL (PF) 2 % IJ SOLN
INTRAMUSCULAR | Status: AC
  Filled 2020-05-21: qty 20

## 2020-05-21 MED ORDER — ACETAMINOPHEN 10 MG/ML IV SOLN: 1000.0000 mg | Freq: Once | INTRAVENOUS | Status: DC | PRN

## 2020-05-21 MED ORDER — CEFAZOLIN SODIUM-DEXTROSE 2-4 GM/100ML-% IV SOLN
2.0000 g | Freq: Once | INTRAVENOUS | Status: AC
  Administered 2020-05-21: 2 g via INTRAVENOUS

## 2020-05-21 MED ORDER — HYDROMORPHONE HCL 1 MG/ML IJ SOLN: 0.2000 mg | INTRAMUSCULAR | Status: DC | PRN

## 2020-05-21 MED ORDER — SODIUM CHLORIDE 0.9 % IV SOLN
INTRAVENOUS | Status: AC
  Filled 2020-05-21: qty 500

## 2020-05-21 MED ORDER — DIPHENHYDRAMINE HCL 50 MG/ML IJ SOLN: 12.5000 mg | INTRAMUSCULAR | Status: DC | PRN

## 2020-05-21 MED ORDER — SCOPOLAMINE 1 MG/3DAYS TD PT72: 1.0000 | MEDICATED_PATCH | Freq: Once | TRANSDERMAL | Status: DC

## 2020-05-21 MED ORDER — ONDANSETRON HCL 4 MG/2ML IJ SOLN
INTRAMUSCULAR | Status: DC | PRN
  Administered 2020-05-21: 4 mg via INTRAVENOUS

## 2020-05-21 MED ORDER — SODIUM CHLORIDE 0.9 % IV SOLN
500.0000 mg | INTRAVENOUS | Status: DC
  Administered 2020-05-21: 500 mg via INTRAVENOUS

## 2020-05-21 MED ORDER — SIMETHICONE 80 MG PO CHEW: 80.0000 mg | CHEWABLE_TABLET | ORAL | Status: DC | PRN

## 2020-05-21 MED ORDER — ONDANSETRON HCL 4 MG/2ML IJ SOLN: 4.0000 mg | Freq: Three times a day (TID) | INTRAMUSCULAR | Status: DC | PRN

## 2020-05-21 MED ORDER — SODIUM CHLORIDE 0.9% FLUSH: 3.0000 mL | INTRAVENOUS | Status: DC | PRN

## 2020-05-21 MED ORDER — DEXAMETHASONE SODIUM PHOSPHATE 10 MG/ML IJ SOLN
INTRAMUSCULAR | Status: DC | PRN
  Administered 2020-05-21: 10 mg via INTRAVENOUS

## 2020-05-21 MED ORDER — OXYCODONE HCL 5 MG PO TABS
5.0000 mg | ORAL_TABLET | ORAL | Status: DC | PRN
  Filled 2020-05-21: qty 2

## 2020-05-21 MED ORDER — ACETAMINOPHEN 500 MG PO TABS
1000.0000 mg | ORAL_TABLET | Freq: Four times a day (QID) | ORAL | Status: AC
  Filled 2020-05-21: qty 2

## 2020-05-21 MED ORDER — SODIUM CHLORIDE 0.9 % IR SOLN
Status: DC | PRN
  Administered 2020-05-21: 1

## 2020-05-21 SURGICAL SUPPLY — 40 items
BENZOIN TINCTURE PRP APPL 2/3 (GAUZE/BANDAGES/DRESSINGS) ×2 IMPLANT
CHLORAPREP W/TINT 26ML (MISCELLANEOUS) ×2 IMPLANT
CLAMP CORD UMBIL (MISCELLANEOUS) IMPLANT
CLOSURE STERI STRIP 1/2 X4 (GAUZE/BANDAGES/DRESSINGS) ×2 IMPLANT
CLOTH BEACON ORANGE TIMEOUT ST (SAFETY) ×2 IMPLANT
DRSG OPSITE POSTOP 4X10 (GAUZE/BANDAGES/DRESSINGS) ×2 IMPLANT
DRSG PAD ABDOMINAL 8X10 ST (GAUZE/BANDAGES/DRESSINGS) ×2 IMPLANT
ELECT REM PT RETURN 9FT ADLT (ELECTROSURGICAL) ×2
ELECTRODE REM PT RTRN 9FT ADLT (ELECTROSURGICAL) ×1 IMPLANT
EXTRACTOR VACUUM KIWI (MISCELLANEOUS) IMPLANT
GAUZE SPONGE 4X4 12PLY STRL (GAUZE/BANDAGES/DRESSINGS) ×2 IMPLANT
GLOVE BIOGEL PI IND STRL 7.0 (GLOVE) ×1 IMPLANT
GLOVE BIOGEL PI IND STRL 9 (GLOVE) ×1 IMPLANT
GLOVE BIOGEL PI INDICATOR 7.0 (GLOVE) ×1
GLOVE BIOGEL PI INDICATOR 9 (GLOVE) ×1
GLOVE SS PI 9.0 STRL (GLOVE) ×2 IMPLANT
GOWN STRL REUS W/TWL 2XL LVL3 (GOWN DISPOSABLE) ×2 IMPLANT
GOWN STRL REUS W/TWL LRG LVL3 (GOWN DISPOSABLE) ×2 IMPLANT
NEEDLE HYPO 25X5/8 SAFETYGLIDE (NEEDLE) IMPLANT
NS IRRIG 1000ML POUR BTL (IV SOLUTION) ×2 IMPLANT
PACK C SECTION WH (CUSTOM PROCEDURE TRAY) ×2 IMPLANT
PAD OB MATERNITY 4.3X12.25 (PERSONAL CARE ITEMS) ×2 IMPLANT
PENCIL SMOKE EVAC W/HOLSTER (ELECTROSURGICAL) ×2 IMPLANT
RTRCTR C-SECT PINK 25CM LRG (MISCELLANEOUS) IMPLANT
RTRCTR C-SECT PINK 34CM XLRG (MISCELLANEOUS) IMPLANT
STRIP CLOSURE SKIN 1/2X4 (GAUZE/BANDAGES/DRESSINGS) IMPLANT
SUT MNCRL 0 VIOLET CTX 36 (SUTURE) ×2 IMPLANT
SUT MONOCRYL 0 CTX 36 (SUTURE) ×4
SUT PLAIN 2 0 (SUTURE) ×2
SUT PLAIN ABS 2-0 CT1 27XMFL (SUTURE) ×1 IMPLANT
SUT VIC AB 0 CT1 27 (SUTURE) ×2
SUT VIC AB 0 CT1 27XBRD ANBCTR (SUTURE) ×1 IMPLANT
SUT VIC AB 2-0 CT1 27 (SUTURE) ×2
SUT VIC AB 2-0 CT1 TAPERPNT 27 (SUTURE) ×1 IMPLANT
SUT VIC AB 4-0 KS 27 (SUTURE) ×2 IMPLANT
SYR BULB IRRIGATION 50ML (SYRINGE) IMPLANT
TAPE HYPAFIX 4 X10 (GAUZE/BANDAGES/DRESSINGS) ×2 IMPLANT
TOWEL OR 17X24 6PK STRL BLUE (TOWEL DISPOSABLE) ×2 IMPLANT
TRAY FOLEY W/BAG SLVR 14FR LF (SET/KITS/TRAYS/PACK) ×2 IMPLANT
WATER STERILE IRR 1000ML POUR (IV SOLUTION) ×2 IMPLANT

## 2020-05-21 NOTE — Progress Notes (Addendum)
CNM to bedside d/t fetal decelerations @0016   Vitals:   05/20/20 1830 05/20/20 1900 05/20/20 2016 05/20/20 2248  BP: 132/72 129/84    Pulse: 92 93    Resp: 16 18    Temp:   98.4 F (36.9 C) 98.3 F (36.8 C)  TempSrc:   Oral Oral  SpO2:    96%   Cervix noted to be 7cm  AROM- mod meconium noted IUPC and FSE placed  After multiple position changes, Late decelerations and variable decelerations continue to occur.   Pitocin discontinued and amnioinfusion ordered  Dr 05/22/20 called and updated  Terbutaline ordered, given @ 0049 Continued interventions with position changes.   Dr Emelda Fear at bedside @0052  to discuss with patient plan of care and possibility of cesarean based on fetal intolerance to labor.   FHR and tracing returned to baseline and no decelerations noted at 0116 Plan to restart pitocin around 0200.  Hopeful SVD   Emelda Fear, CNM 05/21/20, 1:25 AM

## 2020-05-21 NOTE — Discharge Summary (Addendum)
Postpartum Discharge Summary      Patient Name: Bethany Barrera DOB: [birth year] MRN: [id number]  Date of admission: 05/20/2020 Delivery date:05/21/2020  Delivering provider: Mallory Shirk  Date of discharge: 05/24/2020  Admitting diagnosis: Post term pregnancy at [redacted] weeks gestation [O48.0, Z3A.41] Intrauterine pregnancy: [redacted]w[redacted]d    Secondary diagnosis:  Active Problems:   Post term pregnancy at 415weeks gestation  Additional problems: Elevated BP    Discharge diagnosis: Term Pregnancy Delivered                                              Post partum procedures:None Augmentation: AROM and Pitocin Complications: None  Hospital course: Onset of Labor With Unplanned C/S   24y.o. yo G1P1001 at 444w0das admitted in Latent Labor on 05/20/2020. Patient had a labor course significant for augmentation with pitocin and AROM. Notably patient had to be given terbutaline for repetitive deep variables/late decelerations. The patient went for cesarean section due to Arrest of Dilation, Arrest of Descent and fetal intolerance of labor. Delivery details as follows: Membrane Rupture Time/Date: 12:17 AM ,05/21/2020   Delivery Method:C-Section, Low Transverse  Details of operation can be found in separate operative note. Patient's postpartum course complicated by elevated blood pressures. She was started on Procardia on day of discharge.  She is ambulating,tolerating a regular diet, passing flatus, and urinating well.  Patient is discharged home in stable condition 05/24/20.  Newborn Data: Birth date:05/21/2020  Birth time:7:35 AM  Gender:Female  Living status:Living  Apgars:8 ,9  Weight:2875 g   Magnesium Sulfate received: No BMZ received: No Rhophylac:N/A MMR:N/A T-DaP: declined Flu: N/A Transfusion:No  Physical exam  Vitals:   05/23/20 0530 05/23/20 1330 05/23/20 2214 05/24/20 0527  BP: 122/84 132/81 136/82 (!) 143/97  Pulse: 95 (!) 105 (!) 101 94  Resp: '17 18 18 18  ' Temp: 98.3  F (36.8 C) 100.2 F (37.9 C) 98.3 F (36.8 C) 98.6 F (37 C)  TempSrc: Oral Oral Oral Oral  SpO2: 100%   100%   General: alert, cooperative and no distress Lochia: appropriate Uterine Fundus: firm Incision: Healing well with no significant drainage DVT Evaluation: No evidence of DVT seen on physical exam. No cords or calf tenderness. Labs: Lab Results  Component Value Date   WBC 11.7 (H) 05/22/2020   HGB 9.9 (L) 05/22/2020   HCT 31.4 (L) 05/22/2020   MCV 79.1 (L) 05/22/2020   PLT 228 05/22/2020   CMP Latest Ref Rng & Units 05/21/2020  Glucose 65 - 99 mg/dL -  BUN 7 - 25 mg/dL -  Creatinine 0.44 - 1.00 mg/dL 0.75  Sodium 135 - 146 mmol/L -  Potassium 3.5 - 5.3 mmol/L -  Chloride 98 - 110 mmol/L -  CO2 20 - 32 mmol/L -  Calcium 8.6 - 10.2 mg/dL -  Total Protein 6.1 - 8.1 g/dL -  Total Bilirubin 0.2 - 1.2 mg/dL -  Alkaline Phos 33 - 115 U/L -  AST 10 - 30 U/L -  ALT 6 - 29 U/L -   Edinburgh Score: Edinburgh Postnatal Depression Scale Screening Tool 05/22/2020  I have been able to laugh and see the funny side of things. 0  I have looked forward with enjoyment to things. 0  I have blamed myself unnecessarily when things went wrong. 0  I have been anxious or worried  for no good reason. 0  I have felt scared or panicky for no good reason. 0  Things have been getting on top of me. 0  I have been so unhappy that I have had difficulty sleeping. 0  I have felt sad or miserable. 0  I have been so unhappy that I have been crying. 0  The thought of harming myself has occurred to me. 0  Edinburgh Postnatal Depression Scale Total 0     After visit meds:  Allergies as of 05/24/2020      Reactions   Sulfa Antibiotics Shortness Of Breath, Other (See Comments)   tremors   Tramadol Other (See Comments)   headaches   Penicillin G Other (See Comments)   Childhood, pt states she cant remember what reaction she had; took amoxicillin w/o problem      Medication List    STOP  taking these medications   PRENATAL VITAMIN PO     TAKE these medications   acetaminophen 500 MG tablet Commonly known as: TYLENOL Take 2 tablets (1,000 mg total) by mouth every 6 (six) hours. What changed:   medication strength  how much to take  when to take this  reasons to take this   Blood Pressure Monitor Misc For regular home bp monitoring during pregnancy   esomeprazole 40 MG capsule Commonly known as: NEXIUM TAKE 1 CAPSULE BY MOUTH EVERY DAY 20 MINUTES BEFORE BREAKFAST What changed:   how much to take  how to take this  when to take this  additional instructions   ferrous sulfate 325 (65 FE) MG tablet Take 1 tablet (325 mg total) by mouth daily with breakfast.   ibuprofen 800 MG tablet Commonly known as: ADVIL Take 1 tablet (800 mg total) by mouth every 6 (six) hours.   NIFEdipine 30 MG 24 hr tablet Commonly known as: ADALAT CC Take 1 tablet (30 mg total) by mouth daily. Start taking on: May 25, 2020   oxyCODONE 5 MG immediate release tablet Commonly known as: Oxy IR/ROXICODONE Take 1-2 tablets (5-10 mg total) by mouth every 4 (four) hours as needed for moderate pain.        Discharge home in stable condition Infant Feeding: Bottle Infant Disposition:home with mother Discharge instruction: per After Visit Summary and Postpartum booklet. Activity: Advance as tolerated. Pelvic rest for 6 weeks.  Diet: routine diet Future Appointments: Future Appointments  Date Time Provider [place]  06/02/2020 11:10 AM Florian Buff, MD [facility]  06/27/2020  1:30 PM Roma Schanz, CNM [facility]   Follow up Visit:   Please schedule this patient for a In person postpartum visit in 1 week with the following provider: Any provider. Additional Postpartum F/U:Incision check 1 week, BP re-check Low risk pregnancy complicated by: post dates and late prenatal care Delivery mode:  C-Section, Low Transverse  Anticipated Birth Control:   OCPs, to be started at [facility] visit   05/24/2020 Sharion Settler, DO  GME ATTESTATION:  I saw and evaluated the patient. I agree with the findings and the plan of care as documented in the resident's note.  Merilyn Baba, DO OB Fellow, Secretary for [place] 05/24/2020 11:34 AM

## 2020-05-21 NOTE — Progress Notes (Signed)
Attempted to ambulate pt. Pt states that she is dizzy and would like to attempt again after she eats dinner. Pad changed.

## 2020-05-21 NOTE — Anesthesia Postprocedure Evaluation (Signed)
Anesthesia Post Note  Patient: [name]  Procedure(s) Performed: CESAREAN SECTION (N/A )     Patient location during evaluation: PACU Anesthesia Type: Epidural Level of consciousness: awake and alert Pain management: pain level controlled Vital Signs Assessment: post-procedure vital signs reviewed and stable Respiratory status: spontaneous breathing, respiratory function stable and nonlabored ventilation Cardiovascular status: blood pressure returned to baseline Postop Assessment: epidural receding and no apparent nausea or vomiting Anesthetic complications: no   No complications documented.  Last Vitals:  Vitals:   05/21/20 0930 05/21/20 0945  BP: 127/77 133/84  Pulse: 85 81  Resp: 16 18  Temp:  36.5 C  SpO2: 99% 99%    Last Pain:  Vitals:   05/21/20 0945  TempSrc: Oral  PainSc: 0-No pain   Pain Goal:                Epidural/Spinal Function Cutaneous sensation: Normal sensation (05/21/20 0945), Patient able to flex knees: Yes (05/21/20 0945), Patient able to lift hips off bed: Yes (05/21/20 0945), Back pain beyond tenderness at insertion site: No (05/21/20 0945), Progressively worsening motor and/or sensory loss: No (05/21/20 0945), Bowel and/or bladder incontinence post epidural: No (05/21/20 0945)  Beryle Lathe

## 2020-05-21 NOTE — Progress Notes (Signed)
Attempted to get patient out of bed with second 4 hour check at 2100. Patient declined and requested that RN come back after she finishes eating and she will call out when she is done.   Peter Minium 10:13 PM 05/21/2020

## 2020-05-21 NOTE — Progress Notes (Signed)
Bethany Barrera is a 24 y.o. G1P0 at [redacted]w[redacted]d admitted  for induction of labor due to Post dates. Due date 8/7.  Subjective: Pt has not seen any progress in dilation, now still 7 cm. FHR did better in high Fowlers, now decels have returned.  NO change in 4 hours.  Cesarean recommended and pt agrees.  Objective: BP 135/83   Pulse 93   Temp 98.9 F (37.2 C) (Oral)   Resp 17   LMP 07/24/2019 (Within Days)   SpO2 98%  No intake/output data recorded. Total I/O In: -  Out: 1150 [Urine:1150]  FHT:  FHR: 135 bpm, variability: moderate,  accelerations:  Present,  decelerations:  Present variables to 90 UC:   regular, every 3-4 minutes SVE:   Dilation: 7 Effacement (%): 80 Station: -3 Exam by:: Dr. Emelda Fear vertex not well applied.  Labs: Lab Results  Component Value Date   WBC 7.4 05/20/2020   HGB 11.5 (L) 05/20/2020   HCT 35.8 (L) 05/20/2020   MCV 78.3 (L) 05/20/2020   PLT 311 05/20/2020    Assessment / Plan: Arrest in active phase of labor nonreassuring fetal status Labor: fetal intolerance of labor Preeclampsia:   Fetal Wellbeing:  Category II Pain Control:  Epidural I/D:  n/a Anticipated MOD:  cesarean recommended. risks , benefits, rationale reviewed. consent obtained.  Bethany Barrera 05/21/2020, 6:31 AM

## 2020-05-21 NOTE — Transfer of Care (Signed)
Immediate Anesthesia Transfer of Care Note  Patient: Bethany Barrera  Procedure(s) Performed: CESAREAN SECTION (N/A )  Patient Location: PACU  Anesthesia Type:Epidural  Level of Consciousness: awake, alert  and oriented  Airway & Oxygen Therapy: Patient Spontanous Breathing  Post-op Assessment: Report given to RN and Post -op Vital signs reviewed and stable  Post vital signs: Reviewed and stable  Last Vitals:  Vitals Value Taken Time  BP 130/82 05/21/20 0820  Temp 36.6 C 05/21/20 0820  Pulse 99 05/21/20 0822  Resp 24 05/21/20 0822  SpO2 100 % 05/21/20 0822  Vitals shown include unvalidated device data.  Last Pain:  Vitals:   05/21/20 0820  TempSrc: Oral  PainSc:          Complications: No complications documented.

## 2020-05-21 NOTE — Discharge Instructions (Signed)

## 2020-05-21 NOTE — Op Note (Signed)
Operative Note   SURGERY DATE: 05/21/2020  PRE-OP DIAGNOSIS:  *Pregnancy at 41 weeks *Arrest of dilation/Descent *Fetal intolerance of labor  POST-OP DIAGNOSIS:  *Pregnancy at 41 weeks *Arrest of dilation/Descent *Fetal intolerance of labor   PROCEDURE: primary low transverse cesarean section via pfannenstiel skin incision with double layer uterine closure  SURGEON: Surgeon(s) and Role:    Emelda Fear, Cyndi Lennert, MD - Primary    * Miriana Gaertner, Margarette Asal, DO - Fellow  ASSISTANT:     Maryagnes Amos, [place], MD, PGY-2 - Resident  ANESTHESIA: epidural  ESTIMATED BLOOD LOSS: 197 mL  DRAINS: 100 mL UOP via indwelling foley  TOTAL IV FLUIDS: 800 mL crystalloid  VTE PROPHYLAXIS: SCDs to bilateral lower extremities  ANTIBIOTICS: Two grams of Cefazolin and 500 mg Azithromycin were given, within 1 hour of skin incision  SPECIMENS: None  COMPLICATIONS: None immediate  INDICATIONS: Arrest of dilation at 7 cm. Repetitive deep late decelerations.  FINDINGS: No intra-abdominal adhesions were noted. Grossly normal uterus, tubes and ovaries. Meconium-stained amniotic fluid, cephalic female infant, weight 2875 gm, APGARs 8/9, intact placenta.  PROCEDURE IN DETAIL: The patient was taken to the operating room where her epidural was dosed up to surgical levels and normal fetal heart tones were confirmed. She was then prepped and draped in the normal fashion in the dorsal supine position with a leftward tilt.  After a time out was performed, a pfannensteil skin incision was made with the scalpel and carried through to the underlying layer of fascia. The fascia was then incised at the midline and this incision was extended laterally bluntly. Attention was turned to the superior aspect of the fascial incision which was grasped manually, tented up and the rectus muscles were dissected off sharply. In a similar fashion the inferior aspect of the fascial incision was grasped manually, tented up and the rectus  muscles dissected off with the mayo scissors. The rectus muscles were then separated in the midline and the peritoneum was entered bluntly. The Alexis retractor was inserted and the vesicouterine peritoneum was identified.  A low transverse hysterotomy was made with the scalpel until the endometrial cavity was breached and the amniotic sac ruptured with the Allis clamp, yielding meconium-stained amniotic fluid. This incision was extended bluntly and the infant's head, shoulders and body were delivered atraumatically.The cord was clamped x 2 and cut, and the infant was handed to the awaiting pediatricians, after delayed cord clamping was done.  The placenta was then gradually expressed from the uterus and then the uterus was exteriorized and cleared of all clots and debris. The hysterotomy was repaired with a running suture of 1-0 Monocryl. A second imbricating layer of 1-0 Monocryl suture was then placed, achieving excellent hemostasis.   The uterus and adnexa were then returned to the abdomen, and the hysterotomy and all operative sites were reinspected and excellent hemostasis was noted after irrigation and suction of the abdomen with warm saline.  The peritoneum was closed with a running stitch of 3-0 Vicryl. The fascia was reapproximated with 0 Vicryl in a simple running fashion bilaterally. The subcutaneous layer was then reapproximated with a running suture of 2-0 plain gut, and the skin was then closed with 4-0 Vicryl, in a subcuticular fashion.  The patient  tolerated the procedure well. Sponge, lap, needle, and instrument counts were correct x 2. The patient was transferred to the recovery room awake, alert and breathing independently in stable condition.  Marlowe Alt, DO OB Fellow [facility] [employer] (Faculty  Practice)

## 2020-05-22 LAB — CBC
HCT: 31.4 % — ABNORMAL LOW (ref 36.0–46.0)
Hemoglobin: 9.9 g/dL — ABNORMAL LOW (ref 12.0–15.0)
MCH: 24.9 pg — ABNORMAL LOW (ref 26.0–34.0)
MCHC: 31.5 g/dL (ref 30.0–36.0)
MCV: 79.1 fL — ABNORMAL LOW (ref 80.0–100.0)
Platelets: 228 10*3/uL (ref 150–400)
RBC: 3.97 MIL/uL (ref 3.87–5.11)
RDW: 14.2 % (ref 11.5–15.5)
WBC: 11.7 10*3/uL — ABNORMAL HIGH (ref 4.0–10.5)
nRBC: 0 % (ref 0.0–0.2)

## 2020-05-22 MED ORDER — PANTOPRAZOLE SODIUM 40 MG PO TBEC
40.0000 mg | DELAYED_RELEASE_TABLET | Freq: Every day | ORAL | Status: DC
  Filled 2020-05-22 (×2): qty 1

## 2020-05-22 NOTE — Clinical Social Work Maternal (Signed)
**Note Bethany-Identified via Obfuscation** CLINICAL SOCIAL WORK MATERNAL/CHILD NOTE  Patient Details  Name: Bethany Barrera MRN: [id number] Date of Birth: 02-20-1996  Date:  05/22/2020  Clinical Social Worker Initiating Note:  Georgeanne Nim, MSW, LCSW Date/Time: Initiated:  05/22/20/1135     Child's Name:  Bethany Barrera   Biological Parents:  Mother, Father (FOB, Leafy Half, 26 y/o, [phone])   Need for Interpreter:  None   Reason for Referral:  Behavioral Health Concerns, Late or No Prenatal Care    Address:  [address] [place] [address]    Phone number:  [phone] (home)     Additional phone number: none stated  Household Members/Support Persons (HM/SP):   Household Member/Support Person 1, Household Member/Support Person 2, Household Member/Support Person 3   HM/SP Name Relationship DOB or Age  HM/SP -1 Briteny Fulghum mom 09/09/1958  HM/SP -[place] dad 10/15/1958  HM/SP -3 Legrand [place] FOB (30)  HM/SP -4        HM/SP -5        HM/SP -6        HM/SP -7        HM/SP -8          Natural Supports (not living in the home):  Immediate Family   Professional Supports: None   Employment: Unemployed   Type of Work:     Education:  9 to 11 years   Homebound arranged:  (N/A)  Financial Resources:  Medicaid   Other Resources:  Physicist, medical , [place] Considerations Which May Impact Care:  none stated  Strengths:  Home prepared for child , Understanding of illness, Pediatrician chosen, Ability to meet basic needs    Psychotropic Medications:         Pediatrician:    [place]  Pediatrician List:   [place] Other ([place])  [place]      Pediatrician Fax Number:    Risk Factors/Current Problems:  None   Cognitive State:  Able to Concentrate , Alert , Goal Oriented , Linear Thinking    Mood/Affect:  Interested , Comfortable , Calm  , Happy , Relaxed    CSW Assessment: CSW received consult for late prenatal care. CSW met with MOB at bedside to offer support and complete assessment. On arrival, CSW introduced self and stated reason for visit. FOB and infant Havannah were present, however, after PPD/A and SIDS education, FOB stepped out of room to offer MOB privacy during assessment. MOB and FOB were pleasant during visit.    CSW provided education regarding the baby blues period vs. perinatal mood disorders, discussed treatment and gave resources for mental health follow up if concerns arise.  CSW recommends self-evaluation during the postpartum time period using the New Mom Checklist from Postpartum Progress and encouraged MOB and FOB to contact a medical professional if symptoms are noted at any time. MOB and FOB stated understanding and denied any questions or concerns.    CSW provided review of Sudden Infant Death Syndrome (SIDS) precautions. MOB and FOB stated understanding and denied any questions. MOB confirmed having all needed items including car seat and crib for baby's safe sleep.   During assessment, MOB reported prenatal care was delayed due to late acknowledgement of pregnancy then transport concerns. MOB stated transport concerns have been resolved. CSW informed MOB of hospital's infant drug screen policy, infant's negative UDS  results, pending CDS, and CPS report if warranted. MOB stated understanding and denied any questions.  MOB denied any substance use or CPS hx..   MOB confirmed hx of anxiety. MOB identified sx as body pain and illness. MOB identified healthy eating exercise, faith in God, and positive mindset as effective strategies for managing anxiety sx. MOB stated she is however interested in counseling resources. CSW provided resources.  MOB denied any SI, HI, or domestic violence. MOB identified FOB, mom, dad, and sisters as support. MOB stated she is happy and hopefully, as baby brings her great jot. MOB  declined any additional support or resources.  CSW Plan/Description:  No Further Intervention Required/No Barriers to Discharge, Sudden Infant Death Syndrome (SIDS) Education, Perinatal Mood and Anxiety Disorder (PMADs) Education, [place], CSW Will Continue to Monitor Umbilical Cord Tissue Drug Screen Results and Make Report if Warranted    Rochella Benner D. Lissa Morales, MSW, LCSW Clinical Social Worker [phone] 05/22/2020, 12:30 PM

## 2020-05-22 NOTE — Progress Notes (Signed)
CSW acknowledged consult and attempted to meet with MOB. However, bedside nurse was attending to MOB.  CSW will meet with MOB at a later time.  Gussie Towson D. Regan Mcbryar, MSW, LCSW Clinical Social Worker [phone] 

## 2020-05-22 NOTE — Progress Notes (Signed)
POSTPARTUM PROGRESS NOTE  Post Operative Day 1  Subjective:  Bethany Barrera is a 24 y.o. G1P1001 s/p pLTCS 2/2 AoD/fetal intolerance of labor at [redacted]w[redacted]d.  Patient reports that she has been up to bathroom twice overnight. Patient reports having difficulty using urinating on own overnight and RN did in/out catheter. Encouraged patient to push amount of fluids she is consuming, bladder train and try to use restroom every 3-4 hours, lastly encouraged patient to get out of the bed and ambulate around room and to restroom. She denies nausea or vomiting.  Pain is well controlled.  She has had flatus. She has not had bowel movement.  Lochia Small.   Objective: Blood pressure 114/67, pulse 79, temperature 98.2 F (36.8 C), temperature source Oral, resp. rate 18, last menstrual period 07/24/2019, SpO2 98 %, unknown if currently breastfeeding.  Physical Exam:  General: alert, cooperative and no distress Chest: no respiratory distress Heart:regular rate, distal pulses intact Abdomen: soft, nontender,  Uterine Fundus: firm, appropriately tender DVT Evaluation: No calf swelling or tenderness Extremities: no edema Skin:pressure dressing in place, clean/dry/intact  Recent Labs    05/20/20 1308  HGB 11.5*  HCT 35.8*    Assessment/Plan: Bethany Barrera is a 24 y.o. G1P1001 s/p pLTCS at [redacted]w[redacted]d   PPD#1 - Doing okay, having voiding issues overnight, will continue to monitor Contraception: COCs Feeding: Formula  Dispo: Plan for discharge on 8/17.   LOS: 2 days   Mcneil Sober 05/22/2020, 7:45 AM

## 2020-05-23 NOTE — Progress Notes (Addendum)
POSTPARTUM PROGRESS NOTE  Subjective: Bethany Barrera is a 24 y.o. G1P1001 POD#2 s/p pLTCS for fetal intolerance of labor at [redacted]w[redacted]d.  She reports she doing well. No acute events overnight. She denies any problems with ambulating, voiding or po intake. Denies nausea or vomiting. She has passed flatus and had a bowel movement. Pain is well controlled.  Lochia is less than menses.  Objective: Blood pressure 122/84, pulse 95, temperature 98.3 F (36.8 C), temperature source Oral, resp. rate 17, last menstrual period 07/24/2019, SpO2 100 %, unknown if currently breastfeeding.  Physical Exam:  General: alert, cooperative and no distress Chest: no respiratory distress Abdomen: soft, non-tender, incision covered in clean dry dressing Uterine Fundus: firm, appropriately tender Extremities: No calf swelling or tenderness   no edema  Recent Labs    05/20/20 1308 05/22/20 0720  HGB 11.5* 9.9*  HCT 35.8* 31.4*    Assessment/Plan: MARIELLEN Barrera is a 24 y.o. G1P1001 POD#2 s/p pLTCS at [redacted]w[redacted]d for fetal intolerance of labor.  Routine Postpartum Care: Doing well, pain well-controlled, requiring some oxycodone. -- Continue routine care, lactation support  -- Contraception: OCPs at postpartum visit -- Feeding: Bottle -- Social: Late to prenatal care at 29 weeks, pending SW consult prior to discharge.  Dispo: Plan for discharge tomorrow pending normal postpartum course.  Nelda Bucks, MD PGY-3 Family Medicine Resident  Attestation of Supervision of Student:  I confirm that I have verified the information documented in the  resident's  note and that I have also personally reperformed the history, physical exam and all medical decision making activities.  I have verified that all services and findings are accurately documented in this student's note; and I agree with management and plan as outlined in the documentation. I have also made any necessary editorial changes.  Sheila Oats,  MD Center for [facility], [facility] 05/23/2020 12:02 PM

## 2020-05-24 MED ORDER — FERROUS SULFATE 325 (65 FE) MG PO TABS: 325.0000 mg | ORAL_TABLET | Freq: Every day | ORAL | 0 refills | Status: DC

## 2020-05-24 MED ORDER — OXYCODONE HCL 5 MG PO TABS: 5.0000 mg | ORAL_TABLET | ORAL | 0 refills | Status: DC | PRN

## 2020-05-24 MED ORDER — ACETAMINOPHEN 500 MG PO TABS: 1000.0000 mg | ORAL_TABLET | Freq: Four times a day (QID) | ORAL | 0 refills | Status: DC

## 2020-05-24 MED ORDER — FERROUS SULFATE 325 (65 FE) MG PO TABS
325.0000 mg | ORAL_TABLET | ORAL | Status: DC
  Administered 2020-05-24: 325 mg via ORAL
  Filled 2020-05-24: qty 1

## 2020-05-24 MED ORDER — NIFEDIPINE ER OSMOTIC RELEASE 30 MG PO TB24
30.0000 mg | ORAL_TABLET | Freq: Every day | ORAL | Status: DC
  Administered 2020-05-24: 30 mg via ORAL
  Filled 2020-05-24: qty 1

## 2020-05-24 MED ORDER — NIFEDIPINE ER 30 MG PO TB24: 30.0000 mg | ORAL_TABLET | Freq: Every day | ORAL | 0 refills | Status: DC

## 2020-05-24 MED ORDER — IBUPROFEN 800 MG PO TABS: 800.0000 mg | ORAL_TABLET | Freq: Four times a day (QID) | ORAL | 0 refills | Status: DC

## 2020-05-24 MED FILL — ACETAMINOPHEN 500MG XT STRE: 500 | 3 days supply | Qty: 30 | Fill #0

## 2020-05-24 MED FILL — NIFEdipine ER 30 MG TB24: 30 | 30 days supply | Qty: 30 | Fill #0

## 2020-05-24 MED FILL — FERROUS SULFATE 325 MG TAB: 325 (65 FE) | 30 days supply | Qty: 30 | Fill #0

## 2020-05-24 MED FILL — IBUPROFEN 800 MG TAB: 800 | 7 days supply | Qty: 30 | Fill #0

## 2020-05-24 MED FILL — oxyCODONE HCL 5 MG TABS: 5 | 3 days supply | Qty: 20 | Fill #0

## 2020-06-02 ENCOUNTER — Encounter: Payer: Medicaid Other | Admitting: Obstetrics & Gynecology

## 2020-06-06 ENCOUNTER — Encounter: Payer: Self-pay | Admitting: Women's Health

## 2020-06-06 ENCOUNTER — Encounter: Payer: Medicaid Other | Admitting: Obstetrics and Gynecology

## 2020-06-06 ENCOUNTER — Ambulatory Visit (INDEPENDENT_AMBULATORY_CARE_PROVIDER_SITE_OTHER): Payer: Medicaid Other | Admitting: Women's Health

## 2020-06-06 VITALS — BP 127/80 | HR 109 | Ht 65.5 in | Wt 231.0 lb

## 2020-06-06 DIAGNOSIS — Z0130 Encounter for examination of blood pressure without abnormal findings: Secondary | ICD-10-CM

## 2020-06-06 DIAGNOSIS — Z4889 Encounter for other specified surgical aftercare: Secondary | ICD-10-CM | POA: Diagnosis not present

## 2020-06-06 NOTE — Patient Instructions (Signed)
Stop blood pressure medicine on 9/18

## 2020-06-06 NOTE — Progress Notes (Signed)
   GYN VISIT Patient name: Bethany Barrera MRN [id number]  Date of birth: Nov 25, 1995 Chief Complaint:   Routine Post Op  History of Present Illness:   Bethany Barrera is a 24 y.o. G26P1001 African American female 2wks s/p PCS being seen today for incision and bp check. 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 Bottlefeeding. Denies dep/anx.  EPDS today 0.  Depression screen [facility] 2/9 03/02/2020 02/05/2020 11/07/2017  Decreased Interest 1 1 1   Down, Depressed, Hopeless 0 0 1  PHQ - 2 Score 1 1 2   Altered sleeping 0 1 0  Tired, decreased energy 1 1 1   Change in appetite 0 0 0  Feeling bad or failure about yourself  0 0 0  Trouble concentrating 0 0 0  Moving slowly or fidgety/restless 0 0 0  Suicidal thoughts 0 0 0  PHQ-9 Score 2 3 3   Difficult doing work/chores Not difficult at all - Somewhat difficult    Patient's last menstrual period was 07/24/2019 (within days). Review of Systems:   Pertinent items are noted in HPI Denies fever/chills, dizziness, headaches, visual disturbances, fatigue, shortness of breath, chest pain, abdominal pain, vomiting, abnormal vaginal discharge/itching/odor/irritation, problems with periods, bowel movements, urination, or intercourse unless otherwise stated above.  Pertinent History Reviewed:  Reviewed past medical,surgical, social, obstetrical and family history.  Reviewed problem list, medications and allergies. Physical Assessment:   Vitals:   06/06/20 1611  BP: 127/80  Pulse: (!) 109  Weight: 231 lb (104.8 kg)  Height: 5' 5.5" (1.664 m)  Body mass index is 37.86 kg/m.       Physical Examination:   General appearance: alert, well appearing, and in no distress  Mental status: alert, oriented to person, place, and time  Skin: warm & dry   Cardiovascular: normal heart rate noted  Respiratory: normal respiratory effort, no distress  Abdomen: soft, non-tender, c/s incision healing well, steri-strips removed, edges  well-approximated   Pelvic: examination not indicated  Extremities: no edema   Chaperone: n/a    No results found for this or any previous visit (from the past 24 hour(s)).  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   No orders of the defined types were placed in this encounter.   Return for As scheduled 9/20 for pp visit.  CNM, [facility] 06/06/2020 4:40 PM

## 2020-06-27 ENCOUNTER — Ambulatory Visit: Payer: Medicaid Other | Admitting: Women's Health

## 2020-07-06 ENCOUNTER — Ambulatory Visit (INDEPENDENT_AMBULATORY_CARE_PROVIDER_SITE_OTHER): Payer: Medicaid Other | Admitting: Women's Health

## 2020-07-06 ENCOUNTER — Encounter: Payer: Self-pay | Admitting: Women's Health

## 2020-07-06 DIAGNOSIS — Z3202 Encounter for pregnancy test, result negative: Secondary | ICD-10-CM | POA: Diagnosis not present

## 2020-07-06 DIAGNOSIS — Z30011 Encounter for initial prescription of contraceptive pills: Secondary | ICD-10-CM

## 2020-07-06 DIAGNOSIS — Z98891 History of uterine scar from previous surgery: Secondary | ICD-10-CM

## 2020-07-06 DIAGNOSIS — O165 Unspecified maternal hypertension, complicating the puerperium: Secondary | ICD-10-CM

## 2020-07-06 LAB — POCT URINE PREGNANCY: Preg Test, Ur: NEGATIVE

## 2020-07-06 MED ORDER — IBUPROFEN 800 MG PO TABS: 800.0000 mg | ORAL_TABLET | Freq: Four times a day (QID) | ORAL | 0 refills | Status: DC

## 2020-07-06 MED ORDER — LO LOESTRIN FE 1 MG-10 MCG / 10 MCG PO TABS: 1.0000 | ORAL_TABLET | Freq: Every day | ORAL | 3 refills | Status: DC

## 2020-07-06 NOTE — Patient Instructions (Addendum)
Constipation  Drink plenty of fluid, preferably water, throughout the day  Eat foods high in fiber such as fruits, vegetables, and grains  Exercise, such as walking, is a good way to keep your bowels regular  Drink warm fluids, especially warm prune juice, or decaf coffee  Eat a 1/2 cup of real oatmeal (not instant), 1/2 cup applesauce, and 1/2-1 cup warm prune juice every day  If needed, you may take Colace (docusate sodium) stool softener once or twice a day to help keep the stool soft. If you are pregnant, wait until you are out of your first trimester (12-14 weeks of pregnancy)  If you still are having problems with constipation, you may take Miralax once daily as needed to help keep your bowels regular.  If you are pregnant, wait until you are out of your first trimester (12-14 weeks of pregnancy)     Generalized Anxiety Disorder, Adult Generalized anxiety disorder (GAD) is a mental health disorder. People with this condition constantly worry about everyday events. Unlike normal anxiety, worry related to GAD is not triggered by a specific event. These worries also do not fade or get better with time. GAD interferes with life functions, including relationships, work, and school. GAD can vary from mild to severe. People with severe GAD can have intense waves of anxiety with physical symptoms (panic attacks). What are the causes? The exact cause of GAD is not known. What increases the risk? This condition is more likely to develop in:  Women.  People who have a family history of anxiety disorders.  People who are very shy.  People who experience very stressful life events, such as the death of a loved one.  People who have a very stressful family environment. What are the signs or symptoms? People with GAD often worry excessively about many things in their lives, such as their health and family. They may also be overly concerned about:  Doing well at work.  Being on  time.  Natural disasters.  Friendships. Physical symptoms of GAD include:  Fatigue.  Muscle tension or having muscle twitches.  Trembling or feeling shaky.  Being easily startled.  Feeling like your heart is pounding or racing.  Feeling out of breath or like you cannot take a deep breath.  Having trouble falling asleep or staying asleep.  Sweating.  Nausea, diarrhea, or irritable bowel syndrome (IBS).  Headaches.  Trouble concentrating or remembering facts.  Restlessness.  Irritability. How is this diagnosed? Your health care provider can diagnose GAD based on your symptoms and medical history. You will also have a physical exam. The health care provider will ask specific questions about your symptoms, including how severe they are, when they started, and if they come and go. Your health care provider may ask you about your use of alcohol or drugs, including prescription medicines. Your health care provider may refer you to a mental health specialist for further evaluation. Your health care provider will do a thorough examination and may perform additional tests to rule out other possible causes of your symptoms. To be diagnosed with GAD, a person must have anxiety that:  Is out of his or her control.  Affects several different aspects of his or her life, such as work and relationships.  Causes distress that makes him or her unable to take part in normal activities.  Includes at least three physical symptoms of GAD, such as restlessness, fatigue, trouble concentrating, irritability, muscle tension, or sleep problems. Before your health care provider  can confirm a diagnosis of GAD, these symptoms must be present more days than they are not, and they must last for six months or longer. How is this treated? The following therapies are usually used to treat GAD:  Medicine. Antidepressant medicine is usually prescribed for long-term daily control. Antianxiety medicines may  be added in severe cases, especially when panic attacks occur.  Talk therapy (psychotherapy). Certain types of talk therapy can be helpful in treating GAD by providing support, education, and guidance. Options include: ? Cognitive behavioral therapy (CBT). People learn coping skills and techniques to ease their anxiety. They learn to identify unrealistic or negative thoughts and behaviors and to replace them with positive ones. ? Acceptance and commitment therapy (ACT). This treatment teaches people how to be mindful as a way to cope with unwanted thoughts and feelings. ? Biofeedback. This process trains you to manage your body's response (physiological response) through breathing techniques and relaxation methods. You will work with a therapist while machines are used to monitor your physical symptoms.  Stress management techniques. These include yoga, meditation, and exercise. A mental health specialist can help determine which treatment is best for you. Some people see improvement with one type of therapy. However, other people require a combination of therapies. Follow these instructions at home:  Take over-the-counter and prescription medicines only as told by your health care provider.  Try to maintain a normal routine.  Try to anticipate stressful situations and allow extra time to manage them.  Practice any stress management or self-calming techniques as taught by your health care provider.  Do not punish yourself for setbacks or for not making progress.  Try to recognize your accomplishments, even if they are small.  Keep all follow-up visits as told by your health care provider. This is important. Contact a health care provider if:  Your symptoms do not get better.  Your symptoms get worse.  You have signs of depression, such as: ? A persistently sad, cranky, or irritable mood. ? Loss of enjoyment in activities that used to bring you joy. ? Change in weight or  eating. ? Changes in sleeping habits. ? Avoiding friends or family members. ? Loss of energy for normal tasks. ? Feelings of guilt or worthlessness. Get help right away if:  You have serious thoughts about hurting yourself or others. If you ever feel like you may hurt yourself or others, or have thoughts about taking your own life, get help right away. You can go to your nearest emergency department or call:  Your local emergency services (911 in the [place]  A suicide crisis helpline, such as the National Suicide Prevention Lifeline at 4385396252. This is open 24 hours a day. Summary  Generalized anxiety disorder (GAD) is a mental health disorder that involves worry that is not triggered by a specific event.  People with GAD often worry excessively about many things in their lives, such as their health and family.  GAD may cause physical symptoms such as restlessness, trouble concentrating, sleep problems, frequent sweating, nausea, diarrhea, headaches, and trembling or muscle twitching.  A mental health specialist can help determine which treatment is best for you. Some people see improvement with one type of therapy. However, other people require a combination of therapies. This information is not intended to replace advice given to you by your health care provider. Make sure you discuss any questions you have with your health care provider. Document Revised: 09/06/2017 Document Reviewed: 08/14/2016 Elsevier Patient Education  2020 [employer]  [employer].  

## 2020-07-06 NOTE — Progress Notes (Signed)
POSTPARTUM VISIT Patient name: Bethany Barrera MRN [id number]  Date of birth: [birth year] Chief Complaint:   Postpartum Care  History of Present Illness:   Bethany Barrera is a 24 y.o. G42P1001 African American female being seen today for a postpartum visit. She is 6 weeks postpartum following a primary cesarean section, low transverse incision at 41.0 gestational weeks d/t FTP @ 7cm and fetal intolerance of labor. IOL: No, for n/a. Anesthesia: epidural.  Laceration: n/a.  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 Inpatient contraception: no.   Pregnancy uncomplicated. Tobacco use: no. Substance use disorder: no. Last pap smear: 03/02/20 and results were normal. Next pap smear due: 2024 Patient's last menstrual period was 06/22/2020 (exact date).  Postpartum course has been complicated by PPHTN, stopped Procardia. Bleeding no bleeding. Bowel function is constipation. Bladder function is normal. Urinary incontinence? No, fecal incontinence? No Wants refill on ibuprofen for back pain. Patient is not sexually active. Last sexual activity: prior to birth of baby.  Desired contraception: OCPs. Does not smoke, no h/o HTN, DVT/PE, CVA, MI, or migraines w/ aura.  Patient does not want a pregnancy in the near future.  Desired family size is unsure children.   Upstream - 07/06/20 1520      Pregnancy Intention Screening   Does the patient want to become pregnant in the next year? No    Does the patient's partner want to become pregnant in the next year? No    Would the patient like to discuss contraceptive options today? Yes      Contraception Wrap Up   Current Method No Contraceptive Precautions    End Method Oral Contraceptive    Contraception Counseling Provided No          The pregnancy intention screening data noted above was reviewed. Potential methods of contraception were discussed. The patient elected to proceed with Oral Contraceptive.   Edinburgh Postpartum Depression  Screening: negative, but does feel anxious at times  Edinburgh Postnatal Depression Scale - 07/06/20 1523      Edinburgh Postnatal Depression Scale:  In the Past 7 Days   I have been able to laugh and see the funny side of things. 0    I have looked forward with enjoyment to things. 0    I have blamed myself unnecessarily when things went wrong. 0    I have been anxious or worried for no good reason. 0    I have felt scared or panicky for no good reason. 0    Things have been getting on top of me. 0    I have been so unhappy that I have had difficulty sleeping. 0    I have felt sad or miserable. 0    I have been so unhappy that I have been crying. 0    The thought of harming myself has occurred to me. 0    Edinburgh Postnatal Depression Scale Total 0          Baby's course has been uncomplicated. Baby is feeding by bottle. Infant has a pediatrician/family doctor? Yes.  Childcare strategy if returning to work/school: n/a.  Pt has material needs met for her and baby: Yes.   Review of Systems:   Pertinent items are noted in HPI Denies Abnormal vaginal discharge w/ itching/odor/irritation, headaches, visual changes, shortness of breath, chest pain, abdominal pain, severe nausea/vomiting, or problems with urination or bowel movements. Pertinent History Reviewed:  Reviewed past medical,surgical, obstetrical and family history.  Reviewed problem list,  medications and allergies. OB History  Gravida Para Term Preterm AB Living  _0 SAB TAB Ectopic Multiple Live Births        0 1    # Outcome Date GA Lbr Len/2nd Weight Sex Delivery Anes PTL Lv  1 Term 05/21/20 [redacted]w[redacted]d 6 lb 5.4 oz (2.875 kg) F CS-LTranv EPI  LIV   Physical Assessment:   Vitals:   07/06/20 1517 07/06/20 1523  BP: 140/86 125/88  Pulse:  (!) 103  Weight: 237 lb 9.6 oz (107.8 kg)   Height: 5' 5.5" (1.664 m)   Body mass index is 38.94 kg/m.       Physical Examination:   General appearance: alert, well  appearing, and in no distress  Mental status: alert, oriented to person, place, and time  Skin: warm & dry   Cardiovascular: normal heart rate noted   Respiratory: normal respiratory effort, no distress   Breasts: deferred, no complaints   Abdomen: soft, non-tender, c/s incision well healed  Pelvic: examination not indicated  Rectal: not examined  Extremities: no edema       Results for orders placed or performed in visit on 07/06/20 (from the past 24 hour(s))  POCT urine pregnancy   Collection Time: 07/06/20  3:21 PM  Result Value Ref Range   Preg Test, Ur Negative Negative    Assessment & Plan:  1) Postpartum exam 2) 6 wks s/p primary cesarean section, low transverse incision for FTP/NRFHR 3) bottle feeding 4) Depression screening 5) Contraception management, rx LoLoestrin, condoms x 2wks, f/u 325ms 6) Constipation> gave printed prevention/relief measures  7) PPHTN> resolved  Essential components of care per ACOG recommendations:  1.  Mood and well being:  . If positive depression screen, discussed and plan developed.  . If using tobacco we discussed reduction/cessation and risk of relapse . If current substance abuse, we discussed and referral to local resources was offered.   2. Infant care and feeding:  . If breastfeeding, discussed returning to work, pumping, breastfeeding-associated pain, guidance regarding return to fertility while lactating if not using another method. If needed, patient was provided with a letter to be allowed to pump q 2-3hrs to support lactation in a private location with access to a refrigerator to store breastmilk.   . Recommended that all caregivers be immunized for flu, pertussis and other preventable communicable diseases . If pt does not have material needs met for her/baby, referred to local resources for help obtaining these.  3. Sexuality, contraception and birth spacing . Provided guidance regarding sexuality, management of dyspareunia,  and resumption of intercourse . Discussed avoiding interpregnancy interval <32m55m and recommended birth spacing of 18 months  4. Sleep and fatigue . Discussed coping options for fatigue and sleep disruption . Encouraged family/partner/community support of 4 hrs of uninterrupted sleep to help with mood and fatigue  5. Physical recovery  . If pt had a C/S, assessed incisional pain and providing guidance on normal vs prolonged recovery . If pt had a laceration, perineal healing and pain reviewed.  . If urinary or fecal incontinence, discussed management and referred to PT or uro/gyn if indicated  . Patient is safe to resume physical activity. Discussed attainment of healthy weight.  6.  Chronic disease management . Discussed pregnancy complications if any, and their implications for future childbearing and long-term maternal health. . Review recommendations for prevention of recurrent pregnancy complications, such as 17 hydroxyprogesterone caproate to reduce risk for recurrent  PTB not applicable, or aspirin to reduce risk of preeclampsia yes. . Pt had GDM: No. If yes, 2hr GTT scheduled: not applicable. . Reviewed medications and non-pregnant dosing including consideration of whether pt is breastfeeding using a reliable resource such as LactMed: not applicable . Referred for f/u w/ PCP or subspecialist providers as indicated: to f/u w/ rheumatology about +ANA   7. Health maintenance . Mammogram at 26yo or earlier if indicated . Pap smears as indicated  Meds:  Meds ordered this encounter  Medications  . ibuprofen (ADVIL) 800 MG tablet    Sig: Take 1 tablet (800 mg total) by mouth every 6 (six) hours.    Dispense:  20 tablet    Refill:  0    Order Specific Question:   Supervising Provider    Answer:   Elonda Husky, LUTHER H [[id number]]  . LO LOESTRIN FE 1 MG-10 MCG / 10 MCG tablet    Sig: Take 1 tablet by mouth daily.    Dispense:  90 tablet    Refill:  3    For co-pay card, pt to text "Lo Loestrin  Fe " to [phone]              Co-pay card must be run in second position  "other coverage code 3"  if denied d/t PA, step edit, or insurance denial    Order Specific Question:   Supervising Provider    Answer:   Florian Buff [[id number]]    Follow-up: Return in about 3 months (around 10/05/2020) for med f/u in person w/ cnm.   Orders Placed This Encounter  Procedures  . POCT urine pregnancy    Page, [facility] 07/06/2020 4:04 PM

## 2020-10-10 ENCOUNTER — Ambulatory Visit: Payer: Medicaid Other | Admitting: Women's Health

## 2020-11-14 ENCOUNTER — Ambulatory Visit: Payer: Medicaid Other | Admitting: Women's Health

## 2021-05-22 ENCOUNTER — Encounter: Payer: Self-pay | Admitting: Emergency Medicine

## 2021-05-22 ENCOUNTER — Other Ambulatory Visit: Payer: Self-pay

## 2021-05-22 ENCOUNTER — Ambulatory Visit
Admission: EM | Admit: 2021-05-22 | Discharge: 2021-05-22 | Disposition: A | Discharge status: Home / Self Care | Payer: Self-pay | Attending: Family Medicine | Admitting: Family Medicine

## 2021-05-22 DIAGNOSIS — U071 COVID-19: Secondary | ICD-10-CM

## 2021-05-22 MED ORDER — PROMETHAZINE-DM 6.25-15 MG/5ML PO SYRP: 5.0000 mL | ORAL_SOLUTION | Freq: Four times a day (QID) | ORAL | 0 refills | Status: DC | PRN

## 2021-05-22 NOTE — ED Triage Notes (Signed)
Cough, cold chills and getting hot.  Body aches.  S/s started Friday.  +at home covid test yesterday.

## 2021-05-22 NOTE — ED Provider Notes (Signed)
[facility]    CSN: [id number] Arrival date & time: 05/22/21  0820      History   Chief Complaint No chief complaint on file.   HPI Bethany Barrera is a 25 y.o. female.   HPI Patient presents with URI symptoms including cough, sore throat, otalgia, nasal congestion, runny nose, tested positive for COVID today with a home test.  Symptoms have been present for 3 days.  Denies any worrisome symptoms of shortness of breath, wheezing, nausea or vomiting.  Patient has no associated high risk conditions predisposing her to complications of COVID-19. Past Medical History:  Diagnosis Date   Anxiety    Gastritis    GERD (gastroesophageal reflux disease)    Muscle weakness    Seasonal allergies     Patient Active Problem List   Diagnosis Date Noted   Previous cesarean section 07/06/2020   Postpartum hypertension 07/06/2020   Chronic pain w/ +ANA Antibody 03/02/2020   Anxiety 02/05/2020   History of anxiety 07/29/2018   Positive ANA (antinuclear antibody) 05/23/2018   Vitamin D deficiency 02/25/2018   Paroxysmal atrial tachycardia by electrocardiogram (HCC) 02/22/2016   Paresthesia 01/11/2016   GERD (gastroesophageal reflux disease)     Past Surgical History:  Procedure Laterality Date   CESAREAN SECTION N/A 05/21/2020   Procedure: CESAREAN SECTION;  Surgeon: [name], MD;  Location: [facility];  Service: Obstetrics;  Laterality: N/A;   NO PAST SURGERIES      OB History     Gravida  1   Para  1   Term  1   Preterm      AB      Living  1      SAB      IAB      Ectopic      Multiple  0   Live Births  1            Home Medications    Prior to Admission medications   Medication Sig Start Date End Date Taking? Authorizing Provider  promethazine-dextromethorphan (PROMETHAZINE-DM) 6.25-15 MG/5ML syrup Take 5 mLs by mouth 4 (four) times daily as needed for cough. 05/22/21  Yes Bing Neighbors, FNP  acetaminophen (TYLENOL) 500  MG tablet Take 2 tablets (1,000 mg total) by mouth every 6 (six) hours. 05/24/20   Sparacino, Hailey L, DO  esomeprazole (NEXIUM) 40 MG capsule TAKE 1 CAPSULE BY MOUTH EVERY DAY 20 MINUTES BEFORE BREAKFAST 04/30/19   Donita Brooks, MD  ibuprofen (ADVIL) 800 MG tablet Take 1 tablet (800 mg total) by mouth every 6 (six) hours. 07/06/20   Booker, Cala Bradford R, CNM  LO LOESTRIN FE 1 MG-10 MCG / 10 MCG tablet Take 1 tablet by mouth daily. 07/06/20   Cheral Marker, CNM  oxyCODONE (OXY IR/ROXICODONE) 5 MG immediate release tablet Take 1-2 tablets (5-10 mg total) by mouth every 4 (four) hours as needed for moderate pain. Patient not taking: Reported on 06/06/2020 05/24/20   Sparacino, Hailey L, DO  hyoscyamine (LEVSIN SL) 0.125 MG SL tablet Place 1 tablet (0.125 mg total) under the tongue every 8 (eight) hours as needed for cramping. Patient not taking: Reported on 01/04/2016 11/23/15 01/04/16  Hvozdovic, Lawson Fiscal P, PA-C  omeprazole (PRILOSEC) 20 MG capsule Take 1 capsule (20 mg total) by mouth 2 (two) times daily before a meal. Patient not taking: Reported on 01/04/2016 12/16/15 01/04/16  Geoffery Lyons, MD  ranitidine (ZANTAC) 150 MG tablet Take 1 tablet (150 mg  total) by mouth 2 (two) times daily. Patient not taking: Reported on 01/04/2016 11/24/15 01/04/16  Hvozdovic, Lawson Fiscal P, PA-C  sucralfate (CARAFATE) 1 g tablet Take 1 tablet (1 g total) by mouth 4 (four) times daily -  with meals and at bedtime. Patient not taking: Reported on 01/04/2016 12/08/15 01/04/16  Danelle Berry, PA-C    Family History Family History  Problem Relation Age of Onset   Clotting disorder Maternal Grandfather    Diabetes Maternal Grandfather    Heart disease Maternal Grandfather    Breast cancer Paternal Grandmother    Diabetes Mother    Other Mother        gallstones   Hypertension Mother    Heart disease Mother    Hyperlipidemia Father    Other Sister        pre-diabetes   Other Sister        gallstones    Social  History Social History   Tobacco Use   Smoking status: Never   Smokeless tobacco: Never  Vaping Use   Vaping Use: Never used  Substance Use Topics   Alcohol use: No   Drug use: No     Allergies   Sulfa antibiotics, Tramadol, and Penicillin g   Review of Systems Review of Systems Pertinent negatives listed in HPI   Physical Exam Triage Vital Signs ED Triage Vitals  Enc Vitals Group     BP 05/22/21 0852 131/90     Pulse Rate 05/22/21 0852 (!) 118     Resp 05/22/21 0852 20     Temp 05/22/21 0852 98.9 F (37.2 C)     Temp Source 05/22/21 0852 Oral     SpO2 05/22/21 0852 96 %     Weight --      Height --      Head Circumference --      Peak Flow --      Pain Score 05/22/21 0846 0     Pain Loc --      Pain Edu? --      Excl. in GC? --    No data found.  Updated Vital Signs BP 131/90 (BP Location: Right Arm)   Pulse (!) 118   Temp 98.9 F (37.2 C) (Oral)   Resp 20   SpO2 96%   Visual Acuity Right Eye Distance:   Left Eye Distance:   Bilateral Distance:    Right Eye Near:   Left Eye Near:    Bilateral Near:     Physical Exam  General Appearance:    Alert, cooperative, no distress  HENT:   Normocephalic, ears normal, nares mucosal edema with congestion, rhinorrhea, oropharynx without erythema or exudate  Eyes:    PERRL, conjunctiva/corneas clear, EOM's intact       Lungs:     Clear to auscultation bilaterally, respirations unlabored  Heart:    Regular rate and rhythm  Neurologic:   Awake, alert, oriented x 3. No apparent focal neurological           defect.       Remainder of Review of Systems negative except as noted in the HPI.   UC Treatments / Results  Labs (all labs ordered are listed, but only abnormal results are displayed) Labs Reviewed - No data to display  EKG   Radiology No results found.  Procedures Procedures (including critical care time)  Medications Ordered in UC Medications - No data to display  Initial Impression /  Assessment and Plan / UC  Course  I have reviewed the triage vital signs and the nursing notes.  Pertinent labs & imaging results that were available during my care of the patient were reviewed by me and considered in my medical decision making (see chart for details).    Symptom management warranted only.  Manage fever with Tylenol and ibuprofen.  Nasal symptoms with over-the-counter antihistamines recommended.  Treatment per discharge medications/discharge instructions.  Red flags/ER precautions given. The most current CDC isolation/quarantine recommendation advised.   Final Clinical Impressions(s) / UC Diagnoses   Final diagnoses:  COVID-19 virus infection   Discharge Instructions   None    ED Prescriptions     Medication Sig Dispense Auth. Provider   promethazine-dextromethorphan (PROMETHAZINE-DM) 6.25-15 MG/5ML syrup Take 5 mLs by mouth 4 (four) times daily as needed for cough. 140 mL Bing Neighbors, FNP      PDMP not reviewed this encounter.   Bing Neighbors, FNP 05/22/21 1004

## 2021-07-06 ENCOUNTER — Telehealth: Payer: Self-pay | Admitting: Women's Health

## 2021-07-06 NOTE — Telephone Encounter (Signed)
ERROR

## 2021-07-31 ENCOUNTER — Ambulatory Visit: Payer: Self-pay | Admitting: Women's Health

## 2022-08-07 ENCOUNTER — Other Ambulatory Visit: Payer: Self-pay

## 2022-08-07 ENCOUNTER — Encounter: Payer: Self-pay | Admitting: Emergency Medicine

## 2022-08-07 ENCOUNTER — Ambulatory Visit
Admission: EM | Admit: 2022-08-07 | Discharge: 2022-08-07 | Disposition: A | Discharge status: Home / Self Care | Payer: Medicaid Other | Attending: Nurse Practitioner | Admitting: Nurse Practitioner

## 2022-08-07 DIAGNOSIS — F419 Anxiety disorder, unspecified: Secondary | ICD-10-CM

## 2022-08-07 DIAGNOSIS — M792 Neuralgia and neuritis, unspecified: Secondary | ICD-10-CM

## 2022-08-07 MED ORDER — HYDROXYZINE HCL 10 MG PO TABS: 10.0000 mg | ORAL_TABLET | Freq: Three times a day (TID) | ORAL | 0 refills | Status: DC | PRN

## 2022-08-07 MED ORDER — IBUPROFEN 800 MG PO TABS: 800.0000 mg | ORAL_TABLET | Freq: Three times a day (TID) | ORAL | 0 refills | Status: DC | PRN

## 2022-08-07 NOTE — ED Triage Notes (Signed)
Pt reports anxiety and "muscle and nerve pain" for awhile. Pt reports does not have pcp/insurance at this time and would like to be checked out.  Assisted pt to sign up for pcp during triage.

## 2022-08-07 NOTE — Discharge Instructions (Signed)
Take medication as prescribed. Increase fluids and allow for plenty of rest. Try to find ways to help manage her anxiety such as hanging out with friends, and decreasing the amount of time on social media. Go to [facility] [email or web address] and look for "find a doctor".  Look for "online scheduling" and you will be able to search for a primary care physician in this area.  Recommend that you follow-up with primary care so you can obtain the lab work and further management that you need for your symptoms. Go to the emergency department if you have worsening anxiety to include shortness of breath, palpitations, difficulty breathing, feel like you want to hurt yourself or someone else. Follow-up as needed.

## 2022-08-07 NOTE — ED Provider Notes (Signed)
[facility]    CSN: [id number] Arrival date & time: 08/07/22  3235      History   Chief Complaint Chief Complaint  Patient presents with   Anxiety    HPI Bethany Barrera is a 26 y.o. female.   The history is provided by the patient.   Patient presents for complaints of anxiety and muscle aches and weakness.  Patient states her symptoms have been present for over 2 years.  She states that symptoms have been progressively worsening to the point where they are beginning to impact her daily living.  She states that her appetite is decreased, and that she has chronic nerve and muscle pain that she is trying to deal with.  She denies chest pain, shortness of breath, difficulty breathing, abdominal pain, nausea, vomiting, or diarrhea.  Patient states that she currently does not have a primary care physician.  She does not take any medication for anxiety at this time.  Denies suicidal or homicidal ideations.  Past Medical History:  Diagnosis Date   Anxiety    Gastritis    GERD (gastroesophageal reflux disease)    Muscle weakness    Seasonal allergies     Patient Active Problem List   Diagnosis Date Noted   Previous cesarean section 07/06/2020   Postpartum hypertension 07/06/2020   Chronic pain w/ +ANA Antibody 03/02/2020   Anxiety 02/05/2020   History of anxiety 07/29/2018   Positive ANA (antinuclear antibody) 05/23/2018   Vitamin D deficiency 02/25/2018   Paroxysmal atrial tachycardia by electrocardiogram 02/22/2016   Paresthesia 01/11/2016   GERD (gastroesophageal reflux disease)     Past Surgical History:  Procedure Laterality Date   CESAREAN SECTION N/A 05/21/2020   Procedure: CESAREAN SECTION;  Surgeon: [name], MD;  Location: [facility];  Service: Obstetrics;  Laterality: N/A;   NO PAST SURGERIES      OB History     Gravida  1   Para  1   Term  1   Preterm      AB      Living  1      SAB      IAB      Ectopic       Multiple  0   Live Births  1            Home Medications    Prior to Admission medications   Medication Sig Start Date End Date Taking? Authorizing Provider  hydrOXYzine (ATARAX) 10 MG tablet Take 1 tablet (10 mg total) by mouth 3 (three) times daily as needed. 08/07/22  Yes Mireya Meditz-Warren, Sadie Haber, NP  ibuprofen (ADVIL) 800 MG tablet Take 1 tablet (800 mg total) by mouth every 8 (eight) hours as needed. 08/07/22  Yes Batina Dougan-Warren, Sadie Haber, NP  acetaminophen (TYLENOL) 500 MG tablet Take 2 tablets (1,000 mg total) by mouth every 6 (six) hours. 05/24/20   Sparacino, Hailey L, DO  esomeprazole (NEXIUM) 40 MG capsule TAKE 1 CAPSULE BY MOUTH EVERY DAY 20 MINUTES BEFORE BREAKFAST 04/30/19   Donita Brooks, MD  LO LOESTRIN FE 1 MG-10 MCG / 10 MCG tablet Take 1 tablet by mouth daily. 07/06/20   Cheral Marker, CNM  oxyCODONE (OXY IR/ROXICODONE) 5 MG immediate release tablet Take 1-2 tablets (5-10 mg total) by mouth every 4 (four) hours as needed for moderate pain. Patient not taking: Reported on 06/06/2020 05/24/20   Sparacino, Hailey L, DO  promethazine-dextromethorphan (PROMETHAZINE-DM) 6.25-15 MG/5ML syrup Take 5 mLs  by mouth 4 (four) times daily as needed for cough. 05/22/21   Bing Neighbors, FNP  hyoscyamine (LEVSIN SL) 0.125 MG SL tablet Place 1 tablet (0.125 mg total) under the tongue every 8 (eight) hours as needed for cramping. Patient not taking: Reported on 01/04/2016 11/23/15 01/04/16  Hvozdovic, Lawson Fiscal P, PA-C  omeprazole (PRILOSEC) 20 MG capsule Take 1 capsule (20 mg total) by mouth 2 (two) times daily before a meal. Patient not taking: Reported on 01/04/2016 12/16/15 01/04/16  Geoffery Lyons, MD  ranitidine (ZANTAC) 150 MG tablet Take 1 tablet (150 mg total) by mouth 2 (two) times daily. Patient not taking: Reported on 01/04/2016 11/24/15 01/04/16  Hvozdovic, Lawson Fiscal P, PA-C  sucralfate (CARAFATE) 1 g tablet Take 1 tablet (1 g total) by mouth 4 (four) times daily -  with meals and at  bedtime. Patient not taking: Reported on 01/04/2016 12/08/15 01/04/16  Danelle Berry, PA-C    Family History Family History  Problem Relation Age of Onset   Clotting disorder Maternal Grandfather    Diabetes Maternal Grandfather    Heart disease Maternal Grandfather    Breast cancer Paternal Grandmother    Diabetes Mother    Other Mother        gallstones   Hypertension Mother    Heart disease Mother    Hyperlipidemia Father    Other Sister        pre-diabetes   Other Sister        gallstones    Social History Social History   Tobacco Use   Smoking status: Never   Smokeless tobacco: Never  Vaping Use   Vaping Use: Never used  Substance Use Topics   Alcohol use: No   Drug use: No     Allergies   Sulfa antibiotics, Tramadol, and Penicillin g   Review of Systems Review of Systems Per HPI  Physical Exam Triage Vital Signs ED Triage Vitals [08/07/22 0844]  Enc Vitals Group     BP 131/74     Pulse Rate (!) 111     Resp 20     Temp 98.9 F (37.2 C)     Temp Source Oral     SpO2 95 %     Weight      Height      Head Circumference      Peak Flow      Pain Score 8     Pain Loc      Pain Edu?      Excl. in GC?    No data found.  Updated Vital Signs BP 131/74 (BP Location: Right Arm)   Pulse (!) 111   Temp 98.9 F (37.2 C) (Oral)   Resp 20   LMP 07/17/2022 (Approximate)   SpO2 95%   Visual Acuity Right Eye Distance:   Left Eye Distance:   Bilateral Distance:    Right Eye Near:   Left Eye Near:    Bilateral Near:     Physical Exam Vitals and nursing note reviewed.  Constitutional:      General: She is not in acute distress.    Appearance: Normal appearance.  HENT:     Head: Normocephalic.  Eyes:     Extraocular Movements: Extraocular movements intact.     Conjunctiva/sclera: Conjunctivae normal.     Pupils: Pupils are equal, round, and reactive to light.  Cardiovascular:     Rate and Rhythm: Normal rate and regular rhythm.     Pulses:  Normal pulses.  Heart sounds: Normal heart sounds.  Pulmonary:     Effort: Pulmonary effort is normal. No respiratory distress.     Breath sounds: Normal breath sounds. No stridor. No wheezing, rhonchi or rales.  Abdominal:     General: Bowel sounds are normal.     Palpations: Abdomen is soft.  Musculoskeletal:     Cervical back: Normal range of motion.  Lymphadenopathy:     Cervical: No cervical adenopathy.  Skin:    General: Skin is warm and dry.  Neurological:     General: No focal deficit present.     Mental Status: She is alert and oriented to person, place, and time.  Psychiatric:        Mood and Affect: Mood normal.        Behavior: Behavior normal.      UC Treatments / Results  Labs (all labs ordered are listed, but only abnormal results are displayed) Labs Reviewed - No data to display  EKG   Radiology No results found.  Procedures Procedures (including critical care time)  Medications Ordered in UC Medications - No data to display  Initial Impression / Assessment and Plan / UC Course  I have reviewed the triage vital signs and the nursing notes.  Pertinent labs & imaging results that were available during my care of the patient were reviewed by me and considered in my medical decision making (see chart for details).  Patient presents for exacerbation of anxiety in neuropathic pain.  We will start patient on hydroxyzine 10 mg 3 times daily as needed for anxiety.  Patient currently does not have a PCP, provided information to the patient to help with establishing care, patient was also provided support by the nurse.  Patient was given recommendations to help reduce her anxiety to include decrease use of social media, and doing things that she enjoys.  Patient does not have any suicidal or homicidal ideations present.  Recommend that she have a full evaluation by primary care physician to include lab work for her neuropathic pain.  Patient verbalizes  understanding.  All questions were answered.  Patient was given strict indications of when to go to the emergency department.  Patient is stable for discharge. Final Clinical Impressions(s) / UC Diagnoses   Final diagnoses:  Anxiety-like symptoms  Nerve pain     Discharge Instructions      Take medication as prescribed. Increase fluids and allow for plenty of rest. Try to find ways to help manage her anxiety such as hanging out with friends, and decreasing the amount of time on social media. Go to [email or web address] and look for "find a doctor".  Look for "online scheduling" and you will be able to search for a primary care physician in this area.  Recommend that you follow-up with primary care so you can obtain the lab work and further management that you need for your symptoms. Go to the emergency department if you have worsening anxiety to include shortness of breath, palpitations, difficulty breathing, feel like you want to hurt yourself or someone else. Follow-up as needed.     ED Prescriptions     Medication Sig Dispense Auth. Provider   hydrOXYzine (ATARAX) 10 MG tablet Take 1 tablet (10 mg total) by mouth 3 (three) times daily as needed. 30 tablet Sevyn Markham-Warren, Sadie Haber, NP   ibuprofen (ADVIL) 800 MG tablet Take 1 tablet (800 mg total) by mouth every 8 (eight) hours as needed. 30 tablet Jesiel Garate-Warren, Sadie Haber, NP  PDMP not reviewed this encounter.   Tish Men, NP 08/07/22 1103

## 2022-08-09 ENCOUNTER — Ambulatory Visit: Payer: Self-pay | Admitting: Nurse Practitioner

## 2022-08-13 ENCOUNTER — Ambulatory Visit: Payer: Self-pay | Admitting: Family Medicine

## 2022-08-16 ENCOUNTER — Encounter (HOSPITAL_BASED_OUTPATIENT_CLINIC_OR_DEPARTMENT_OTHER): Payer: Self-pay | Admitting: Emergency Medicine

## 2022-08-16 ENCOUNTER — Emergency Department (HOSPITAL_BASED_OUTPATIENT_CLINIC_OR_DEPARTMENT_OTHER)
Admission: EM | Admit: 2022-08-16 | Discharge: 2022-08-16 | Disposition: A | Discharge status: Home / Self Care | Payer: Medicaid Other | Attending: Emergency Medicine | Admitting: Emergency Medicine

## 2022-08-16 ENCOUNTER — Other Ambulatory Visit: Payer: Self-pay

## 2022-08-16 DIAGNOSIS — H53149 Visual discomfort, unspecified: Secondary | ICD-10-CM | POA: Diagnosis not present

## 2022-08-16 DIAGNOSIS — M791 Myalgia, unspecified site: Secondary | ICD-10-CM | POA: Insufficient documentation

## 2022-08-16 DIAGNOSIS — R52 Pain, unspecified: Secondary | ICD-10-CM

## 2022-08-16 MED ORDER — DEXAMETHASONE 4 MG PO TABS
10.0000 mg | ORAL_TABLET | Freq: Once | ORAL | Status: AC
  Administered 2022-08-16: 10 mg via ORAL
  Filled 2022-08-16: qty 3

## 2022-08-16 MED ORDER — DIPHENHYDRAMINE HCL 50 MG/ML IJ SOLN
25.0000 mg | Freq: Once | INTRAMUSCULAR | Status: AC
  Administered 2022-08-16: 25 mg via INTRAMUSCULAR
  Filled 2022-08-16: qty 1

## 2022-08-16 MED ORDER — PROCHLORPERAZINE EDISYLATE 10 MG/2ML IJ SOLN
10.0000 mg | Freq: Once | INTRAMUSCULAR | Status: AC
  Administered 2022-08-16: 10 mg via INTRAMUSCULAR
  Filled 2022-08-16: qty 2

## 2022-08-16 NOTE — ED Notes (Signed)
Pt with questions regarding ordered medications.  RN educated about medications and why they were ordered, potential side effects.  Pt with request to speak with EDP Adela Lank regarding medications.  EDP Adela Lank made aware.

## 2022-08-16 NOTE — ED Provider Notes (Signed)
[facility] EMERGENCY DEPARTMENT Provider Note   CSN: [id number] Arrival date & time: 08/16/22  1256     History  Chief Complaint  Patient presents with   Muscle Pain   Anxiety    Bethany Barrera is a 26 y.o. female.  26 yo F with a chief complaints of pain all over her body.  This been going on for at least 4 years.  She feels like it is getting progressively worse.  She has not had insurance and so has not been able to see anyone with the exception of the free clinic where she was told to start ibuprofen which she did not think was working.  She had been given muscle relaxant in the past and thought that that helped and so was wondering if she can get that today.  She tells me that she has pain all over she describes it as her nerves and muscles not working correctly.  Tells me that it is worse to the posterior aspect of the head and seems occur later in the day.  Has some pain with swallowing off and on as well.  No fevers no rashes no tick bites.   Muscle Pain  Anxiety       Home Medications Prior to Admission medications   Medication Sig Start Date End Date Taking? Authorizing Provider  acetaminophen (TYLENOL) 500 MG tablet Take 2 tablets (1,000 mg total) by mouth every 6 (six) hours. 05/24/20   Sparacino, Hailey L, DO  esomeprazole (NEXIUM) 40 MG capsule TAKE 1 CAPSULE BY MOUTH EVERY DAY 20 MINUTES BEFORE BREAKFAST 04/30/19   Donita Brooks, MD  hydrOXYzine (ATARAX) 10 MG tablet Take 1 tablet (10 mg total) by mouth 3 (three) times daily as needed. 08/07/22   Leath-Warren, Sadie Haber, NP  ibuprofen (ADVIL) 800 MG tablet Take 1 tablet (800 mg total) by mouth every 8 (eight) hours as needed. 08/07/22   Leath-Warren, Sadie Haber, NP  LO LOESTRIN FE 1 MG-10 MCG / 10 MCG tablet Take 1 tablet by mouth daily. 07/06/20   Cheral Marker, CNM  oxyCODONE (OXY IR/ROXICODONE) 5 MG immediate release tablet Take 1-2 tablets (5-10 mg total) by mouth every 4 (four) hours as  needed for moderate pain. Patient not taking: Reported on 06/06/2020 05/24/20   Sparacino, Hailey L, DO  promethazine-dextromethorphan (PROMETHAZINE-DM) 6.25-15 MG/5ML syrup Take 5 mLs by mouth 4 (four) times daily as needed for cough. 05/22/21   Bing Neighbors, FNP  hyoscyamine (LEVSIN SL) 0.125 MG SL tablet Place 1 tablet (0.125 mg total) under the tongue every 8 (eight) hours as needed for cramping. Patient not taking: Reported on 01/04/2016 11/23/15 01/04/16  Hvozdovic, Lawson Fiscal P, PA-C  omeprazole (PRILOSEC) 20 MG capsule Take 1 capsule (20 mg total) by mouth 2 (two) times daily before a meal. Patient not taking: Reported on 01/04/2016 12/16/15 01/04/16  Geoffery Lyons, MD  ranitidine (ZANTAC) 150 MG tablet Take 1 tablet (150 mg total) by mouth 2 (two) times daily. Patient not taking: Reported on 01/04/2016 11/24/15 01/04/16  Hvozdovic, Lawson Fiscal P, PA-C  sucralfate (CARAFATE) 1 g tablet Take 1 tablet (1 g total) by mouth 4 (four) times daily -  with meals and at bedtime. Patient not taking: Reported on 01/04/2016 12/08/15 01/04/16  Danelle Berry, PA-C      Allergies    Sulfa antibiotics, Tramadol, and Penicillin g    Review of Systems   Review of Systems  Physical Exam Updated Vital Signs BP 132/80 (BP Location:  Right Arm)   Pulse 98   Temp 98.4 F (36.9 C) (Oral)   Resp 18   LMP 08/15/2022 (Exact Date)   SpO2 100%  Physical Exam Vitals and nursing note reviewed.  Constitutional:      General: She is not in acute distress.    Appearance: She is well-developed. She is not diaphoretic.  HENT:     Head: Normocephalic and atraumatic.  Eyes:     Pupils: Pupils are equal, round, and reactive to light.  Cardiovascular:     Rate and Rhythm: Normal rate and regular rhythm.     Heart sounds: No murmur heard.    No friction rub. No gallop.  Pulmonary:     Effort: Pulmonary effort is normal.     Breath sounds: No wheezing or rales.  Abdominal:     General: There is no distension.     Palpations:  Abdomen is soft.     Tenderness: There is no abdominal tenderness.  Musculoskeletal:        General: No tenderness.     Cervical back: Normal range of motion and neck supple.  Skin:    General: Skin is warm and dry.  Neurological:     Mental Status: She is alert and oriented to person, place, and time.     Cranial Nerves: Cranial nerves 2-12 are intact.     Sensory: Sensation is intact.     Motor: Motor function is intact.     Coordination: Coordination is intact.     Gait: Gait is intact.     Comments: Photophobia otherwise benign neurologic exam  Psychiatric:        Behavior: Behavior normal.     ED Results / Procedures / Treatments   Labs (all labs ordered are listed, but only abnormal results are displayed) Labs Reviewed - No data to display  EKG None  Radiology No results found.  Procedures Procedures    Medications Ordered in ED Medications  prochlorperazine (COMPAZINE) injection 10 mg (has no administration in time range)  diphenhydrAMINE (BENADRYL) injection 25 mg (has no administration in time range)  dexamethasone (DECADRON) tablet 10 mg (has no administration in time range)    ED Course/ Medical Decision Making/ A&P                           Medical Decision Making Risk Prescription drug management.   26 yo F with a chief complaints of diffuse pain.  She tells me this is all over her body and has been going on for about 4 years now.  She seemed to perseverate about an occipital headache seems to be worse at the end of the day.  Could be tension headaches.  Offered a headache cocktail here.  Will give neurology follow-up.  Encouraged her to follow-up with her family doctor in the office.  I did not feel that laboratory testing here would likely be of benefit as the patient's had symptoms for greater than 4 years.  She is well-appearing nontoxic.  Appears well-hydrated.  Has a benign neurologic exam.  4:21 PM:  I have discussed the  diagnosis/risks/treatment options with the patient and family.  Evaluation and diagnostic testing in the emergency department does not suggest an emergent condition requiring admission or immediate intervention beyond what has been performed at this time.  They will follow up with PCP. We also discussed returning to the ED immediately if new or worsening sx occur. We discussed  the sx which are most concerning (e.g., sudden worsening pain, fever, inability to tolerate by mouth) that necessitate immediate return. Medications administered to the patient during their visit and any new prescriptions provided to the patient are listed below.  Medications given during this visit Medications  prochlorperazine (COMPAZINE) injection 10 mg (has no administration in time range)  diphenhydrAMINE (BENADRYL) injection 25 mg (has no administration in time range)  dexamethasone (DECADRON) tablet 10 mg (has no administration in time range)     The patient appears reasonably screen and/or stabilized for discharge and I doubt any other medical condition or other [facility] requiring further screening, evaluation, or treatment in the ED at this time prior to discharge.          Final Clinical Impression(s) / ED Diagnoses Final diagnoses:  Myalgia  Diffuse pain    Rx / DC Orders ED Discharge Orders          Ordered    Ambulatory referral to Neurology       Comments: Headadche syndrome?   08/16/22 1559              Melene Plan, DO 08/16/22 1621

## 2022-08-16 NOTE — ED Triage Notes (Signed)
Pt c/o throbbing headache "nerve pain" for "awhile now" worsening x5 months. Denies blurred vision, unilateral weakness, slurred speech.   Also c/o ongoing anxiety. Also c/o throat pain when swallowing. "Its like my muscles and nerves are not functioning correctly." Denies falls, syncopal episodes.   Pt speaking in full sentences, AOx4.

## 2022-08-16 NOTE — Discharge Instructions (Signed)
Take 4 over the counter ibuprofen tablets 3 times a day or 2 over-the-counter naproxen tablets twice a day for pain. Also take tylenol 1000mg (2 extra strength) four times a day.    Please follow-up with provider in the office.  During our conversation you seemed to focus on symptoms primarily in your head which is why I had ordered you a headache cocktail as well as have given you referral for a neurologist to see you.

## 2022-09-17 ENCOUNTER — Ambulatory Visit: Payer: Medicaid Other | Admitting: Women's Health

## 2022-10-05 ENCOUNTER — Ambulatory Visit: Payer: Medicaid Other | Admitting: Family Medicine

## 2022-10-16 ENCOUNTER — Telehealth: Payer: Self-pay | Admitting: Neurology

## 2022-10-16 NOTE — Telephone Encounter (Signed)
Ok for her to see Dr. Jaynee Eagles

## 2022-10-16 NOTE — Telephone Encounter (Signed)
Patient is currently scheduled to see Dr. Krista Blue Thursday based on a referral we received from the [facility] for headaches. She was last seen by Dr. Krista Blue in 2017 but called in today to request to see Dr. Jaynee Eagles instead since she's a headache specialist. Would you both be ok with this?

## 2022-10-18 ENCOUNTER — Institutional Professional Consult (permissible substitution): Payer: Medicaid Other | Admitting: Neurology

## 2022-10-18 NOTE — Telephone Encounter (Signed)
That's fine, thanks!

## 2022-11-26 ENCOUNTER — Ambulatory Visit: Payer: Medicaid Other | Admitting: Family Medicine

## 2022-12-05 ENCOUNTER — Encounter: Payer: Self-pay | Admitting: Neurology

## 2022-12-05 ENCOUNTER — Ambulatory Visit (INDEPENDENT_AMBULATORY_CARE_PROVIDER_SITE_OTHER): Payer: Medicaid Other | Admitting: Neurology

## 2022-12-05 VITALS — Ht 65.0 in | Wt 254.4 lb

## 2022-12-05 DIAGNOSIS — G8929 Other chronic pain: Secondary | ICD-10-CM | POA: Diagnosis not present

## 2022-12-05 DIAGNOSIS — G441 Vascular headache, not elsewhere classified: Secondary | ICD-10-CM

## 2022-12-05 DIAGNOSIS — G932 Benign intracranial hypertension: Secondary | ICD-10-CM

## 2022-12-05 DIAGNOSIS — H539 Unspecified visual disturbance: Secondary | ICD-10-CM | POA: Diagnosis not present

## 2022-12-05 DIAGNOSIS — H4710 Unspecified papilledema: Secondary | ICD-10-CM

## 2022-12-05 DIAGNOSIS — R51 Headache with orthostatic component, not elsewhere classified: Secondary | ICD-10-CM

## 2022-12-05 DIAGNOSIS — R519 Headache, unspecified: Secondary | ICD-10-CM

## 2022-12-05 MED ORDER — ALPRAZOLAM 0.25 MG PO TABS: ORAL_TABLET | ORAL | 0 refills | Status: DC

## 2022-12-05 NOTE — Patient Instructions (Addendum)
MRI of the brain and blood vessels w/wo contrast Ophthalmologist for eye exam  If above abnormal will order Spinal Tap (lumbar puncture)  If normal, treat for migraines  Workup for IDIOPATHIC INTRACRANIAL HYPERTENSION or possibly migraine  Migraine Headache A migraine headache is an intense pulsing or throbbing pain on one or both sides of the head. Migraine headaches may also cause other symptoms, such as nausea, vomiting, and sensitivity to light and noise. A migraine headache can last from 4 hours to 3 days. Talk with your health care provider about what things may bring on (trigger) your migraine headaches. What are the causes? The exact cause is not known. However, a migraine may be caused when nerves in the brain get irritated and release chemicals that cause blood vessels to become inflamed. This inflammation causes pain. Migraines may be triggered or caused by: Smoking. Medicines, such as: Nitroglycerin, which is used to treat chest pain. Birth control pills. Estrogen. Certain blood pressure medicines. Foods or drinks that contain nitrates, glutamate, aspartame, MSG, or tyramine. Certain foods or drinks, such as aged cheeses, chocolate, alcohol, or caffeine. Doing physical activity that is very hard. Other triggers may include: Menstruation. Pregnancy. Hunger. Stress. Getting too much or too little sleep. Weather changes. Tiredness (fatigue). What increases the risk? The following factors may make you more likely to have migraine headaches: Being between the ages of 55-49 years old. Being female. Having a family history of migraine headaches. Being Caucasian. Having a mental health condition, such as depression or anxiety. Being obese. What are the signs or symptoms? The main symptom of this condition is pulsing or throbbing pain. This pain may: Happen in any area of the head, such as on one or both sides. Make it hard to do daily activities. Get worse with physical  activity. Get worse around bright lights, loud noises, or smells. Other symptoms may include: Nausea. Vomiting. Dizziness. Before a migraine headache starts, you may get warning signs (an aura). An aura may include: Seeing flashing lights or having blind spots. Seeing bright spots, halos, or zigzag lines. Having tunnel vision or blurred vision. Having numbness or a tingling feeling. Having trouble talking. Having muscle weakness. After a migraine ends, you may have symptoms. These may include: Feeling tired. Trouble concentrating. How is this diagnosed? A migraine headache can be diagnosed based on: Your symptoms. A physical exam. Tests, such as: A CT scan or an MRI of the head. These tests can help rule out other causes of headaches. Taking fluid from the spine (lumbar puncture) to examine it (cerebrospinal fluid analysis, or CSF analysis). How is this treated? This condition may be treated with medicines that: Relieve pain and nausea. Prevent migraines. Treatment may also include: Acupuncture. Lifestyle changes like avoiding foods that trigger migraine headaches. Learning ways to control your body (biofeedback). Talk therapy to help you know and deal with negative thoughts (cognitive behavioral therapy). Follow these instructions at home: Medicines Take over-the-counter and prescription medicines only as told by your provider. Ask your provider if the medicine prescribed to you: Requires you to avoid driving or using machinery. Can cause constipation. You may need to take these actions to prevent or treat constipation: Drink enough fluid to keep your pee (urine) pale yellow. Take over-the-counter or prescription medicines. Eat foods that are high in fiber, such as beans, whole grains, and fresh fruits and vegetables. Limit foods that are high in fat and processed sugars, such as fried or sweet foods. Lifestyle  Do not drink alcohol.  Do not use any products that contain  nicotine or tobacco. These products include cigarettes, chewing tobacco, and vaping devices, such as e-cigarettes. If you need help quitting, ask your provider. Get 7-9 hours of sleep each night, or the amount recommended by your provider. Find ways to manage stress, such as meditation, deep breathing, or yoga. Try to exercise regularly. This can help lessen how bad and how often your migraines occur. General instructions Keep a journal to find out what triggers your migraines, so you can avoid those things. For example, write down: What you eat and drink. How much sleep you get. Any change to your diet or medicines. If you have a migraine headache: Avoid things that make your symptoms worse, such as bright lights. Lie down in a dark, quiet room. Do not drive or use machinery. Ask your provider what activities are safe for you while you have symptoms. Keep all follow-up visits. Your provider will monitor your symptoms and recommend any further treatment. Where to find more information Coalition for Headache and Migraine Patients (CHAMP): headachemigraine.org American Migraine Foundation: americanmigrainefoundation.org National Headache Foundation: headaches.org Contact a health care provider if: You have symptoms that are different or worse than your usual migraine headache symptoms. You have more than 15 days of headaches in one month. Get help right away if: Your migraine headache becomes severe or lasts more than 72 hours. You have a fever or stiff neck. You have vision loss. Your muscles feel weak or like you cannot control them. You lose your balance often or have trouble walking. You faint. You have a seizure. This information is not intended to replace advice given to you by your health care provider. Make sure you discuss any questions you have with your health care provider. Document Revised: 05/21/2022 Document Reviewed: 05/21/2022 Elsevier Patient Education  [place].   Idiopathic Intracranial Hypertension  Idiopathic intracranial hypertension (IIH) is a condition that increases pressure around the brain. The fluid that surrounds the brain and spinal cord (cerebrospinal fluid, or CSF) increases and causes the pressure. Idiopathic means that the cause of this condition is not known. IIH affects the brain and spinal cord. If this condition is not treated, it can cause vision loss or blindness. What are the causes? The cause of this condition is not known. What increases the risk? The following factors may make you more likely to develop this condition: Being obese. Being a person who is female, between the ages of 59 and 66 years old, and who has not gone through menopause. Taking certain medicines, such as birth control, acne medicines, or steroids. What are the signs or symptoms? Symptoms of this condition include: Headaches. This is the most common symptom. Brief periods of total blindness. Double vision, blurred vision, or poor side (peripheral) vision. Pain in the shoulders or neck. Nausea and vomiting. A sound like rushing water or a pulsing sound within the ears (pulsatile tinnitus), or ringing in the ears. How is this diagnosed? This condition may be diagnosed based on: Your symptoms and medical history. Imaging tests of the brain, such as: CT scan. MRI. Magnetic resonance venogram (MRV) to check the veins. Diagnostic lumbar puncture. This is a procedure to remove and examine a sample of CSF. This procedure can determine whether your fluid pressure is too high. An eye exam to check for swelling or nerve damage in the eyes. How is this treated? Treatment for this condition depends on the symptoms. The goal of treatment is to decrease  the pressure around your brain. Common treatments include: Weight loss through healthy eating, salt restriction, and exercise, if you are overweight. Medicines to decrease the production of CSF and lower  the pressure within your skull. Medicines to prevent or treat headaches. Other treatments may include: Surgery to place drains (shunts) in your brain to remove extra fluid. Lumbar puncture to remove extra CSF. Follow these instructions at home: If you are overweight or obese, work with your health care provider to lose weight. Take over-the-counter and prescription medicines only as told by your health care provider. Ask your health care provider if the medicine prescribed to you requires you to avoid driving or using machinery. Do not use any products that contain nicotine or tobacco. These products include cigarettes, chewing tobacco, and vaping devices, such as e-cigarettes. If you need help quitting, ask your health care provider. Keep all follow-up visits. Your health care provider will need to monitor you regularly. Contact a health care provider if: You have changes in your vision, such as: Double vision. Blurred vision. Poor peripheral vision. Get help right away if: You have any of the following symptoms and they get worse or do not get better: Headaches. Nausea. Vomiting. Sudden trouble seeing. This information is not intended to replace advice given to you by your health care provider. Make sure you discuss any questions you have with your health care provider. Document Revised: 02/20/2022 Document Reviewed: 01/30/2022 Elsevier Patient Education  [place].  Lumbar Puncture A lumbar puncture, also called a spinal tap, is a procedure that removes a small amount of the fluid that surrounds the brain and spinal cord (cerebrospinal fluid, CSF). The fluid is then examined in the lab. This procedure may be done to: Help diagnose various problems, such as meningitis, encephalitis, multiple sclerosis, and other infections. Remove fluid and relieve pressure that occurs with certain types of headaches. Look for bleeding within the brain and spinal cord areas (central nervous  system). Place medicine into the spinal fluid. Tell a health care provider about: Any allergies you have. All medicines you are taking, including vitamins, herbs, eye drops, creams, and over-the-counter medicines like aspirin or NSAIDs. Any problems you or family members have had with anesthetic medicines. Any bleeding problems you have. Any surgeries you have had. Any medical conditions you have. Whether you are pregnant or may be pregnant. What are the risks? Generally, this is a safe procedure. However, problems may occur, including: Infection at the insertion site that can spread to the bone or CSF. Bleeding. Leakage of CSF. Spinal headache. This is a severe headache that occurs when there is a leak of CSF. Allergic reactions to medicines or dyes. Damage to other structures or organs. What happens before the procedure? Medicines Ask your health care provider about: Changing or stopping your regular medicines. This is especially important if you are taking diabetes medicines or blood thinners. Taking medicines such as aspirin and ibuprofen. These medicines can thin your blood. Do not take these medicines unless your health care provider tells you to take them Taking over-the-counter medicines, vitamins, herbs, and supplements General instructions You may have a blood sample taken. Ask your health care provider: How your puncture site will be marked. What steps will be taken to help prevent infection. These may include: Removing hair at the puncture site. Washing skin with a germ-killing soap. Receiving antibiotic medicine. If you will be going home right after the procedure, plan to have a responsible adult: When to stop eating and  drinking Follow instructions from your health care provider about what you may eat and drink before your procedure. These may include: 8 hours before the procedure Stop eating most foods. Do not eat meat, fried foods, or fatty foods. Eat only light  foods, such as toast or crackers. All liquids are okay except energy drinks and alcohol. 6 hours before the procedure Stop eating. Drink only clear liquids, such as water, clear fruit juice, black coffee, plain tea, and sports drinks. Do not drink energy drinks or alcohol. 2 hours before the procedure Stop drinking all liquids. You may be allowed to take medicine with small sips of water. If you do not follow your health care provider's instructions, your procedure may be delayed or canceled. What happens during the procedure?  You may lie down on your side with your knees bent, or you may sit with your head resting on a pillow on your lap. How you are positioned depends on your age and size. You will be positioned so that the spaces between the bones of the spine (vertebrae) are as wide as possible. This will make it easier to pass the needle into the spinal canal. The skin on your lower back (lumbar region) will be cleaned. You will be given an injection of medicine to numb your lower back area (local anesthetic). You may be given pain medicine or a medicine to help you relax (sedative). A small needle will be inserted into your lower back until it enters the space that contains the CSF. The needle will not enter the spinal cord. CSF will be collected into tubes. It will be sent to a lab for examination. The needle will be withdrawn, and a bandage (dressing) will be placed over the area. The procedure may vary among health care providers and hospitals. What happens after the procedure? Your blood pressure, heart rate, breathing rate, and blood oxygen level will be monitored until any medicines you were given have worn off. You may need to stay lying down for a while. You will need to drink plenty of fluids and caffeine to help prevent a headache. If you were given a sedative during the procedure, it can affect you for several hours. Do not drive or operate machinery until your health care  provider says that it is safe. Summary A lumbar puncture, also called a spinal tap, is a procedure that is done to remove a small amount of CSF. This may be done to help diagnose a wide variety of conditions. Before the procedure, tell your health care provider about all medicines you are taking, including vitamins, herbs, eye drops, creams, and over-the-counter medicines. Before the procedure, ask your health care provider about changing or stopping your regular medicines. This is especially important if you are taking diabetes medicines or blood thinners. Your lower back will be numbed with an injection before the needle is placed into your spinal canal. After the procedure, you will lie down for a while and you will drink plenty of fluids. This information is not intended to replace advice given to you by your health care provider. Make sure you discuss any questions you have with your health care provider. Document Revised: 05/19/2021 Document Reviewed: 05/19/2021 Elsevier Patient Education  [place].  Migraine Headache A migraine headache is an intense pulsing or throbbing pain on one or both sides of the head. Migraine headaches may also cause other symptoms, such as nausea, vomiting, and sensitivity to light and noise. A migraine headache can last  from 4 hours to 3 days. Talk with your health care provider about what things may bring on (trigger) your migraine headaches. What are the causes? The exact cause is not known. However, a migraine may be caused when nerves in the brain get irritated and release chemicals that cause blood vessels to become inflamed. This inflammation causes pain. Migraines may be triggered or caused by: Smoking. Medicines, such as: Nitroglycerin, which is used to treat chest pain. Birth control pills. Estrogen. Certain blood pressure medicines. Foods or drinks that contain nitrates, glutamate, aspartame, MSG, or tyramine. Certain foods or drinks, such  as aged cheeses, chocolate, alcohol, or caffeine. Doing physical activity that is very hard. Other triggers may include: Menstruation. Pregnancy. Hunger. Stress. Getting too much or too little sleep. Weather changes. Tiredness (fatigue). What increases the risk? The following factors may make you more likely to have migraine headaches: Being between the ages of 80-23 years old. Being female. Having a family history of migraine headaches. Being Caucasian. Having a mental health condition, such as depression or anxiety. Being obese. What are the signs or symptoms? The main symptom of this condition is pulsing or throbbing pain. This pain may: Happen in any area of the head, such as on one or both sides. Make it hard to do daily activities. Get worse with physical activity. Get worse around bright lights, loud noises, or smells. Other symptoms may include: Nausea. Vomiting. Dizziness. Before a migraine headache starts, you may get warning signs (an aura). An aura may include: Seeing flashing lights or having blind spots. Seeing bright spots, halos, or zigzag lines. Having tunnel vision or blurred vision. Having numbness or a tingling feeling. Having trouble talking. Having muscle weakness. After a migraine ends, you may have symptoms. These may include: Feeling tired. Trouble concentrating. How is this diagnosed? A migraine headache can be diagnosed based on: Your symptoms. A physical exam. Tests, such as: A CT scan or an MRI of the head. These tests can help rule out other causes of headaches. Taking fluid from the spine (lumbar puncture) to examine it (cerebrospinal fluid analysis, or CSF analysis). How is this treated? This condition may be treated with medicines that: Relieve pain and nausea. Prevent migraines. Treatment may also include: Acupuncture. Lifestyle changes like avoiding foods that trigger migraine headaches. Learning ways to control your body  (biofeedback). Talk therapy to help you know and deal with negative thoughts (cognitive behavioral therapy). Follow these instructions at home: Medicines Take over-the-counter and prescription medicines only as told by your provider. Ask your provider if the medicine prescribed to you: Requires you to avoid driving or using machinery. Can cause constipation. You may need to take these actions to prevent or treat constipation: Drink enough fluid to keep your pee (urine) pale yellow. Take over-the-counter or prescription medicines. Eat foods that are high in fiber, such as beans, whole grains, and fresh fruits and vegetables. Limit foods that are high in fat and processed sugars, such as fried or sweet foods. Lifestyle  Do not drink alcohol. Do not use any products that contain nicotine or tobacco. These products include cigarettes, chewing tobacco, and vaping devices, such as e-cigarettes. If you need help quitting, ask your provider. Get 7-9 hours of sleep each night, or the amount recommended by your provider. Find ways to manage stress, such as meditation, deep breathing, or yoga. Try to exercise regularly. This can help lessen how bad and how often your migraines occur. General instructions Keep a journal to find out  what triggers your migraines, so you can avoid those things. For example, write down: What you eat and drink. How much sleep you get. Any change to your diet or medicines. If you have a migraine headache: Avoid things that make your symptoms worse, such as bright lights. Lie down in a dark, quiet room. Do not drive or use machinery. Ask your provider what activities are safe for you while you have symptoms. Keep all follow-up visits. Your provider will monitor your symptoms and recommend any further treatment. Where to find more information Coalition for Headache and Migraine Patients (CHAMP): headachemigraine.org American Migraine Foundation:  americanmigrainefoundation.org National Headache Foundation: headaches.org Contact a health care provider if: You have symptoms that are different or worse than your usual migraine headache symptoms. You have more than 15 days of headaches in one month. Get help right away if: Your migraine headache becomes severe or lasts more than 72 hours. You have a fever or stiff neck. You have vision loss. Your muscles feel weak or like you cannot control them. You lose your balance often or have trouble walking. You faint. You have a seizure. This information is not intended to replace advice given to you by your health care provider. Make sure you discuss any questions you have with your health care provider. Document Revised: 05/21/2022 Document Reviewed: 05/21/2022 Elsevier Patient Education  [place].  Alprazolam Tablets What is this medication? ALPRAZOLAM (al PRAY zoe lam) treats anxiety. It works by Child psychotherapist system calm down. It belongs to a group of medications called benzodiazepines. This medicine may be used for other purposes; ask your health care provider or pharmacist if you have questions. COMMON BRAND NAME(S): Xanax What should I tell my care team before I take this medication? They need to know if you have any of these conditions: Kidney disease Liver disease Lung disease, such as asthma or breathing problems Mental health condition Seizures Substance use disorder Suicidal thoughts, plans, or attempt by you or a family member An unusual or allergic reaction to alprazolam, other medications, foods, dyes, or preservatives Pregnant or trying to get pregnant Breastfeeding How should I use this medication? Take this medication by mouth. Take it as directed on the prescription label. Do not take it more often than directed. Keep taking it unless your care team tells you to stop. A special MedGuide will be given to you by the pharmacist with each prescription  and refill. Be sure to read this information carefully each time. Talk to your care team about the use of this medication in children. Special care may be needed. People 65 years and older may have a stronger reaction and need a smaller dose. Overdosage: If you think you have taken too much of this medicine contact a poison control center or emergency room at once. NOTE: This medicine is only for you. Do not share this medicine with others. What if I miss a dose? If you miss a dose, take it as soon as you can. If it is almost time for your next dose, take only that dose. Do not take double or extra doses. What may interact with this medication? Do not take this medication with any of the following: Adagrasib Certain antivirals for HIV or hepatitis Certain medications for fungal infections, such as ketoconazole, itraconazole, or posaconazole Clarithromycin Grapefruit juice Opioid medications for cough Sodium oxybate This medication may also interact with the following: Alcohol Antihistamines for allergy, cough and cold Certain medications for anxiety or sleep Certain  medications for depression, such as amitriptyline, fluoxetine, fluvoxamine, nefazodone, sertraline Certain medications for seizures, such as carbamazepine, phenobarbital, phenytoin, primidone Cimetidine Digoxin Erythromycin Estrogen or progestin hormones General anesthetics, such as halothane, isoflurane, methoxyflurane, propofol Medications that relax muscles Opioid medications for pain Phenothiazines, such as chlorpromazine, mesoridazine, prochlorperazine, thioridazine This list may not describe all possible interactions. Give your health care provider a list of all the medicines, herbs, non-prescription drugs, or dietary supplements you use. Also tell them if you smoke, drink alcohol, or use illegal drugs. Some items may interact with your medicine. What should I watch for while using this medication? Visit your care  team for regular checks on your progress. Tell your care team if your symptoms do not start to get better or if they get worse. Do not stop taking except on your care team's advice. You may develop a severe reaction. Your care team will tell you how much medication to take. Long term use of this medication may cause your brain and body to depend on it. This can happen even when used as directed by your care team. You and your care team will work together to determine how long you will need to take this medication. This medication may affect your coordination, reaction time, or judgment. Do not drive or operate machinery until you know how this medication affects you. Sit up or stand slowly to reduce the risk of dizzy or fainting spells. Drinking alcohol with this medication can increase the risk of these side effects. If you are taking another medication that also causes drowsiness, you may have more side effects. Give your care team a list of all medications you use. Your care team will tell you how much medication to take. Do not take more medication than directed. Get emergency help right away if you have problems breathing or unusual sleepiness. If you or your family notice any changes in your behavior, such as new or worsening depression, thoughts of harming yourself, anxiety, other unusual or disturbing thoughts, or memory loss, call your care team right away. Talk to your care team if you wish to become pregnant or think you might be pregnant. This medication can cause serious birth defects. Talk to your care team before breastfeeding. Changes to your treatment plan may be needed. What side effects may I notice from receiving this medication? Side effects that you should report to your care team as soon as possible: Allergic reactions--skin rash, itching, hives, swelling of the face, lips, tongue, or throat CNS depression--slow or shallow breathing, shortness of breath, feeling faint, dizziness,  confusion, trouble staying awake Thoughts of suicide or self-harm, worsening mood, feelings of depression Side effects that usually do not require medical attention (report to your care team if they continue or are bothersome): Change in sex drive or performance Dizziness Drowsiness Nausea This list may not describe all possible side effects. Call your doctor for medical advice about side effects. You may report side effects to FDA at 1-800-FDA-1088. Where should I keep my medication? Keep out of the reach of children and pets. This medication can be abused. Keep it in a safe place to protect it from theft. Do not share it with anyone. It is only for you. Selling or giving away this medication is dangerous and against the law. Store at room temperature between 20 and 25 degrees C (68 and 77 degrees F). Get rid of any unused medication after the expiration date. This medication may cause harm and death if it is  taken by other adults, children, or pets. It is important to get rid of the medication as soon as you no longer need it, or it is expired. You can do this in two ways: Take the medication to a medication take-back program. Check with your pharmacy or law enforcement to find a location. If you cannot return the medication, check the label or package insert to see if the medication should be thrown out in the garbage or flushed down the toilet. If you are not sure, ask your care team. If it is safe to put it in the trash, take the medication out of the container. Mix the medication with cat litter, dirt, coffee grounds, or other unwanted substance. Seal the mixture in a bag or container. Put it in the trash. NOTE: This sheet is a summary. It may not cover all possible information. If you have questions about this medicine, talk to your doctor, pharmacist, or health care provider.  2023 Elsevier/Gold Standard (2021-10-05 00:00:00)

## 2022-12-05 NOTE — Progress Notes (Addendum)
[facility]    Provider:  Dr Lucia Gaskins Requesting Provider: Melene Plan, DO Primary Care Provider:  Donita Brooks, MD  CC:  headache  Addendum 06/19/2023:  Pt unable to complete MRI  you ordered  last month pt states not able to keep head support on head during the test Pt states head was hurting and sore  Pt is requesting an open MRI or CT scan     HPI:  Bethany Barrera is a 27 y.o. female here as requested by Melene Plan, DO for headaches. In her head she feels like its in her eyes, in the back of the head, radiates to the back of the head and feels like inside her head. Feels like its in the middle of her head. More tightness, positional when she gets up feel beating in her head, like she can feel her heartbeat inside her head, she has vision changes, she will see spots in her eyes, in both eyes, tightness around the head. No ear fullness. Started 3 years ago. Getting worse. Recently worsening over 5 months, daily, mother here and provides information but doesn't hear snoring. Last eye exam was a few weeks ago and was fine [facility] not an ophthalmologist or optometrist. She has some sound sensitivity concerts make it worse. No nausea. Gets worse after she gets out of bed and worsens all day long. Praying makes it better. Not tired during the day.   Reviewed notes, labs and imaging from outside physicians, which showed:  01/06/2016 MRI: FINDINGS: There is no evidence of acute infarct, intracranial hemorrhage, mass, midline shift, or extra-axial fluid collection. Ventricles and sulci are normal. The brain is normal in signal without evidence of demyelinating disease.   Orbits are unremarkable. No fluid in the paranasal sinuses are mastoid air cells. Major intracranial vascular flow voids are preserved.   IMPRESSION: Unremarkable appearance of the brain.  MRI cervical spine: FINDINGS:  Normal signal is present in the cervical and upper thoracic spinal   cord to the lowest imaged level, T1-2. Marrow signal, vertebral body  heights, and alignment are normal. There straightening of the normal  cervical lordosis. Craniocervical junction is within normal limits.  The visualized intracranial contents are normal.   Flow is present within the vertebral arteries.   Minimal disc bulging is present at C5-6 and C6-7. There is no  significant focal disc protrusion or stenosis.   IMPRESSION:  1. Minimal disc bulging at C5-6 and C6-7 without a significant  protrusion or focal stenosis.  2. Straightening of the normal cervical lordosis. This is  nonspecific, but can be seen in the setting of muscle strain or  ongoing pain.   Review of Systems: Patient complains of symptoms per HPI as well as the following symptoms headache. Pertinent negatives and positives per HPI. All others negative.   Social History   Socioeconomic History   Marital status: Single    Spouse name: Not on file   Number of children: 0   Years of education: 11   Highest education level: Not on file  Occupational History   Occupation: Consulting civil engineer  Tobacco Use   Smoking status: Never   Smokeless tobacco: Never  Vaping Use   Vaping Use: Never used  Substance and Sexual Activity   Alcohol use: No   Drug use: No   Sexual activity: Not Currently    Partners: Male    Birth control/protection: None  Other Topics Concern   Not on file  Social History  Narrative   Lives at home with mother and father.   Right-handed.   No caffeine use.   Social Determinants of Health   Financial Resource Strain: Low Risk  (03/02/2020)   Overall Financial Resource Strain (CARDIA)    Difficulty of Paying Living Expenses: Not hard at all  Recent Concern: Financial Resource Strain - Medium Risk (02/05/2020)   Overall Financial Resource Strain (CARDIA)    Difficulty of Paying Living Expenses: Somewhat hard  Food Insecurity: No Food Insecurity (03/02/2020)   Hunger Vital Sign    Worried About  Running Out of Food in the Last Year: Never true    Ran Out of Food in the Last Year: Never true  Transportation Needs: No Transportation Needs (03/02/2020)   PRAPARE - Administrator, Civil Service (Medical): No    Lack of Transportation (Non-Medical): No  Physical Activity: Insufficiently Active (03/02/2020)   Exercise Vital Sign    Days of Exercise per Week: 2 days    Minutes of Exercise per Session: 20 min  Stress: No Stress Concern Present (03/02/2020)   [employer] of Occupational Health - Occupational Stress Questionnaire    Feeling of Stress : Only a little  Recent Concern: Stress - Stress Concern Present (02/05/2020)   [employer] of Occupational Health - Occupational Stress Questionnaire    Feeling of Stress : To some extent  Social Connections: Moderately Isolated (03/02/2020)   Social Connection and Isolation Panel [NHANES]    Frequency of Communication with Friends and Family: More than three times a week    Frequency of Social Gatherings with Friends and Family: Twice a week    Attends Religious Services: More than 4 times per year    Active Member of [employer] or Organizations: No    Attends Banker Meetings: Never    Marital Status: Never married  Intimate Partner Violence: Not At Risk (03/02/2020)   Humiliation, Afraid, Rape, and Kick questionnaire    Fear of Current or Ex-Partner: No    Emotionally Abused: No    Physically Abused: No    Sexually Abused: No    Family History  Problem Relation Age of Onset   Diabetes Mother    Other Mother        gallstones   Hypertension Mother    Heart disease Mother    Hyperlipidemia Father    Other Sister        pre-diabetes   Other Sister        gallstones   Clotting disorder Maternal Grandfather    Diabetes Maternal Grandfather    Heart disease Maternal Grandfather    Breast cancer Paternal Grandmother    Migraines Neg Hx    Headache Neg Hx     Past Medical History:  Diagnosis Date    Anxiety    Gastritis    GERD (gastroesophageal reflux disease)    Muscle weakness    Seasonal allergies     Patient Active Problem List   Diagnosis Date Noted   Chronic intractable headache 12/06/2022   Previous cesarean section 07/06/2020   Postpartum hypertension 07/06/2020   Chronic pain w/ +ANA Antibody 03/02/2020   Anxiety 02/05/2020   History of anxiety 07/29/2018   Positive ANA (antinuclear antibody) 05/23/2018   Vitamin D deficiency 02/25/2018   Paroxysmal atrial tachycardia by electrocardiogram 02/22/2016   Paresthesia 01/11/2016   GERD (gastroesophageal reflux disease)     Past Surgical History:  Procedure Laterality Date   CESAREAN SECTION N/A 05/21/2020  Procedure: CESAREAN SECTION;  Surgeon: [name], MD;  Location: [facility];  Service: Obstetrics;  Laterality: N/A;   NO PAST SURGERIES      Current Outpatient Medications  Medication Sig Dispense Refill   ALPRAZolam (XANAX) 0.25 MG tablet Take 1 tab (0.22m) 60 minutes before MRI or lumbar puncture(spinal tap). If needed may take another tab(0.25mg ) 15-20 minutes before MRI or Lumbar Puncture/spinal tap.  Do not drive. Do not take on the same day as your diazepam. 4 tablet 0   Ascorbic Acid (VITAMIN C PO) Take by mouth.     baclofen (LIORESAL) 10 MG tablet Take 1 tablet by mouth as needed.     diazepam (VALIUM) 10 MG tablet Take by mouth as needed.     esomeprazole (NEXIUM) 40 MG capsule TAKE 1 CAPSULE BY MOUTH EVERY DAY 20 MINUTES BEFORE BREAKFAST 30 capsule 12   ferrous sulfate 325 (65 FE) MG tablet Take 1 tablet by mouth daily.     MAGNESIUM GLYCINATE PO Take 100 mg by mouth. 2 gummies daily     meloxicam (MOBIC) 15 MG tablet Take 1 tablet by mouth daily.     predniSONE (DELTASONE) 20 MG tablet 10 mg 2 (two) times a week.     promethazine-dextromethorphan (PROMETHAZINE-DM) 6.25-15 MG/5ML syrup Take 5 mLs by mouth 4 (four) times daily as needed for cough. 140 mL 0   VITAMIN D PO Take 2,000 Units by  mouth daily.     Vitamin D, Ergocalciferol, (DRISDOL) 1.25 MG (50000 UNIT) CAPS capsule Take 50,000 Units by mouth once a week.     No current facility-administered medications for this visit.    Allergies as of 12/05/2022 - Review Complete 12/05/2022  Allergen Reaction Noted   Sulfa antibiotics Shortness Of Breath and Other (See Comments) 02/19/2011   Tramadol Other (See Comments) 03/03/2014   Penicillin g Other (See Comments) 04/22/2019    Vitals: Ht 5\' 5"  (1.651 m)   Wt 254 lb 6.4 oz (115.4 kg)   BMI 42.33 kg/m  Last Weight:  Wt Readings from Last 1 Encounters:  12/05/22 254 lb 6.4 oz (115.4 kg)   Last Height:   Ht Readings from Last 1 Encounters:  12/05/22 5\' 5"  (1.651 m)    12/05/2022 2:40 PM 12/05/2022 2:43 PM 12/05/2022 2:46 PM   Orthostatic BP 120/78 122/78 114/77  Orthostatic Pulse 86 80 89  Weight 254 lb 6.4 oz (115.4 kg)    Height 5\' 5"  (1.651 m)      Physical exam: Exam: Gen: NAD, conversant, well nourised, obese, well groomed                     CV: RRR, no MRG. No Carotid Bruits. No peripheral edema, warm, nontender Eyes: Conjunctivae clear without exudates or hemorrhage  Neuro: Detailed Neurologic Exam  Speech:    Speech is normal; fluent and spontaneous with normal comprehension.  Cognition:    The patient is oriented to person, place, and time;     recent and remote memory intact;     language fluent;     normal attention, concentration,     fund of knowledge Cranial Nerves:    The pupils are equal, round, and reactive to light. ONH elevation? Visual fields are full to finger confrontation. Extraocular movements are intact. Trigeminal sensation is intact and the muscles of mastication are normal. The face is symmetric. The palate elevates in the midline. Hearing intact. Voice is normal. Shoulder shrug is normal. The tongue  has normal motion without fasciculations.   Coordination:    Normal finger to nose and heel to shin. Normal rapid alternating  movements.   Gait:    Heel-toe and tandem gait are normal.   Motor Observation:    No asymmetry, no atrophy, and no involuntary movements noted. Tone:    Normal muscle tone.    Posture:    Posture is normal. normal erect    Strength:    Strength is V/V in the upper and lower limbs.      Sensation: intact to LT     Reflex Exam:  DTR's:    Deep tendon reflexes in the upper and lower extremities are normal bilaterally.   Toes:    The toes are downgoing bilaterally.   Clonus:    Clonus is absent.    12/05/2022 2:40 PM 12/05/2022 2:43 PM 12/05/2022 2:46 PM   Orthostatic BP 120/78 122/78 114/77  Orthostatic Pulse 86 80 89  Weight 254 lb 6.4 oz (115.4 kg)    Height 5\' 5"  (1.651 m)      Assessment/Plan:  Workup for IDIOPATHIC INTRACRANIAL HYPERTENSION or possibly migraine  MRI of the brain and blood vessels w/wo contrast: MRI brain due to concerning symptoms of morning headaches, positional and exertional headaches,vision changes, worsening headaches  to look for space occupying mass, chiari or intracranial hypertension (pseudotumor), strokes, malignancies, vasculidities, demyelination(multiple sclerosis) or other  Ophthalmologist for eye exam : ONH elevation?  If above abnormal will order Spinal Tap (lumbar puncture)  If normal, treat for migraines  Workup for IDIOPATHIC INTRACRANIAL HYPERTENSION or possibly migraine  Orders Placed This Encounter  Procedures   MR BRAIN W WO CONTRAST   Comprehensive metabolic panel   CBC with Differential/Platelets   TSH Rfx on Abnormal to Free T4   Ambulatory referral to Ophthalmology   Meds ordered this encounter  Medications   ALPRAZolam (XANAX) 0.25 MG tablet    Sig: Take 1 tab (0.10m) 60 minutes before MRI or lumbar puncture(spinal tap). If needed may take another tab(0.25mg ) 15-20 minutes before MRI or Lumbar Puncture/spinal tap.  Do not drive. Do not take on the same day as your diazepam.    Dispense:  4 tablet    Refill:  0     Cc: Melene Plan, DO,  Pickard, Priscille Heidelberg, MD  Naomie Dean, MD  [facility] [address] [place], [place] [address]  Phone [phone] Fax [phone]

## 2022-12-06 ENCOUNTER — Encounter: Payer: Self-pay | Admitting: Neurology

## 2022-12-06 ENCOUNTER — Telehealth: Payer: Self-pay | Admitting: Neurology

## 2022-12-06 DIAGNOSIS — R519 Headache, unspecified: Secondary | ICD-10-CM | POA: Insufficient documentation

## 2022-12-06 DIAGNOSIS — G8929 Other chronic pain: Secondary | ICD-10-CM | POA: Insufficient documentation

## 2022-12-06 LAB — CBC WITH DIFFERENTIAL/PLATELET
Basophils Absolute: 0 10*3/uL (ref 0.0–0.2)
Basos: 0 %
EOS (ABSOLUTE): 0.1 10*3/uL (ref 0.0–0.4)
Eos: 1 %
Hematocrit: 40.3 % (ref 34.0–46.6)
Hemoglobin: 12.8 g/dL (ref 11.1–15.9)
Immature Grans (Abs): 0 10*3/uL (ref 0.0–0.1)
Immature Granulocytes: 0 %
Lymphocytes Absolute: 1.9 10*3/uL (ref 0.7–3.1)
Lymphs: 36 %
MCH: 24.6 pg — ABNORMAL LOW (ref 26.6–33.0)
MCHC: 31.8 g/dL (ref 31.5–35.7)
MCV: 77 fL — ABNORMAL LOW (ref 79–97)
Monocytes Absolute: 0.2 10*3/uL (ref 0.1–0.9)
Monocytes: 4 %
Neutrophils Absolute: 3.1 10*3/uL (ref 1.4–7.0)
Neutrophils: 59 %
Platelets: 414 10*3/uL (ref 150–450)
RBC: 5.21 x10E6/uL (ref 3.77–5.28)
RDW: 17.1 % — ABNORMAL HIGH (ref 11.7–15.4)
WBC: 5.3 10*3/uL (ref 3.4–10.8)

## 2022-12-06 LAB — COMPREHENSIVE METABOLIC PANEL
ALT: 6 IU/L (ref 0–32)
AST: 13 IU/L (ref 0–40)
Albumin/Globulin Ratio: 1.6 (ref 1.2–2.2)
Albumin: 4.7 g/dL (ref 4.0–5.0)
Alkaline Phosphatase: 104 IU/L (ref 44–121)
BUN/Creatinine Ratio: 12 (ref 9–23)
BUN: 9 mg/dL (ref 6–20)
Bilirubin Total: 0.2 mg/dL (ref 0.0–1.2)
CO2: 23 mmol/L (ref 20–29)
Calcium: 9.5 mg/dL (ref 8.7–10.2)
Chloride: 101 mmol/L (ref 96–106)
Creatinine, Ser: 0.78 mg/dL (ref 0.57–1.00)
Globulin, Total: 2.9 g/dL (ref 1.5–4.5)
Glucose: 89 mg/dL (ref 70–99)
Potassium: 4.4 mmol/L (ref 3.5–5.2)
Sodium: 140 mmol/L (ref 134–144)
Total Protein: 7.6 g/dL (ref 6.0–8.5)
eGFR: 105 mL/min/{1.73_m2} (ref >59)

## 2022-12-06 LAB — TSH RFX ON ABNORMAL TO FREE T4: TSH: 2.87 u[IU]/mL (ref 0.450–4.500)

## 2022-12-06 NOTE — Telephone Encounter (Signed)
Referral sent to [facility], phone # [phone]. 

## 2022-12-06 NOTE — Telephone Encounter (Signed)
[employer] medicaid Josem KaufmannTR:[id number] exp. 12/06/22-01/20/23 sent to [facility] [phone]

## 2023-01-02 ENCOUNTER — Other Ambulatory Visit: Payer: Medicaid Other

## 2023-01-24 ENCOUNTER — Other Ambulatory Visit: Payer: Medicaid Other

## 2023-01-28 ENCOUNTER — Ambulatory Visit: Payer: Medicaid Other | Admitting: Women's Health

## 2023-01-30 ENCOUNTER — Ambulatory Visit
Admission: EM | Admit: 2023-01-30 | Discharge: 2023-01-30 | Disposition: A | Discharge status: Home / Self Care | Payer: Medicaid Other | Attending: Family Medicine | Admitting: Family Medicine

## 2023-01-30 DIAGNOSIS — Z1152 Encounter for screening for COVID-19: Secondary | ICD-10-CM | POA: Insufficient documentation

## 2023-01-30 DIAGNOSIS — J069 Acute upper respiratory infection, unspecified: Secondary | ICD-10-CM | POA: Insufficient documentation

## 2023-01-30 DIAGNOSIS — R059 Cough, unspecified: Secondary | ICD-10-CM | POA: Insufficient documentation

## 2023-01-30 LAB — POCT INFLUENZA A/B
Influenza A, POC: NEGATIVE
Influenza B, POC: NEGATIVE

## 2023-01-30 MED ORDER — PROMETHAZINE-DM 6.25-15 MG/5ML PO SYRP: 5.0000 mL | ORAL_SOLUTION | Freq: Four times a day (QID) | ORAL | 0 refills | Status: DC | PRN

## 2023-01-30 NOTE — ED Triage Notes (Signed)
Pt reports she has had coughing, sneezing, congested, throat pain x 2 days. Took tylenol cold and flu but no relief.

## 2023-01-30 NOTE — Discharge Instructions (Addendum)
You have been tested for COVID-19 today. °If your test returns positive, you will receive a phone call from [facility] regarding your results. °Negative test results are not called. °Both positive and negative results area always visible on MyChart. °If you do not have a MyChart account, sign up instructions are provided in your discharge papers. °Please do not hesitate to contact us should you have questions or concerns. ° °

## 2023-01-31 ENCOUNTER — Ambulatory Visit: Payer: Medicaid Other | Admitting: Advanced Practice Midwife

## 2023-01-31 LAB — SARS CORONAVIRUS 2 (TAT 6-24 HRS): SARS Coronavirus 2: NEGATIVE

## 2023-01-31 NOTE — ED Provider Notes (Signed)
[facility]   [id number] 01/30/23 Arrival Time: 1915  ASSESSMENT & PLAN:  1. Viral URI with cough    Discussed typical duration of likely viral illness.  Results for orders placed or performed during the hospital encounter of 01/30/23  SARS CORONAVIRUS 2 (TAT 6-24 HRS) Anterior Nasal Swab   Specimen: Anterior Nasal Swab  Result Value Ref Range   SARS Coronavirus 2 NEGATIVE NEGATIVE  POCT Influenza A/B  Result Value Ref Range   Influenza A, POC Negative Negative   Influenza B, POC Negative Negative   OTC symptom care as needed.  Meds ordered this encounter  Medications   promethazine-dextromethorphan (PROMETHAZINE-DM) 6.25-15 MG/5ML syrup    Sig: Take 5 mLs by mouth 4 (four) times daily as needed for cough.    Dispense:  118 mL    Refill:  0      Follow-up Information     Donita Brooks, MD.   Specialty: Family Medicine Why: As needed. Contact information: [address] [place] [address] [phone]                 Reviewed expectations re: course of current medical issues. Questions answered. Outlined signs and symptoms indicating need for more acute intervention. Understanding verbalized. After Visit Summary given.   SUBJECTIVE: History from: Patient. Bethany Barrera is a 27 y.o. female. Pt reports she has had coughing, sneezing, congested, throat pain x 2 days. Took tylenol cold and flu but no relief.  Denies: fever and difficulty breathing. Normal PO intake without n/v/d. Daughter with same symptoms.  OBJECTIVE:  Vitals:   01/30/23 1927  BP: 133/83  Pulse: (!) 123  Resp: 20  Temp: 98.6 F (37 C)  TempSrc: Oral  SpO2: 94%    Recheck P: 102  General appearance: alert; no distress Eyes: PERRLA; EOMI; conjunctiva normal HENT: Young; AT; with nasal congestion Neck: supple  Lungs: speaks full sentences without difficulty; unlabored; dry cough Extremities: no edema Skin: warm and dry Neurologic: normal  gait Psychological: alert and cooperative; normal mood and affect  Labs: Results for orders placed or performed during the hospital encounter of 01/30/23  SARS CORONAVIRUS 2 (TAT 6-24 HRS) Anterior Nasal Swab   Specimen: Anterior Nasal Swab  Result Value Ref Range   SARS Coronavirus 2 NEGATIVE NEGATIVE  POCT Influenza A/B  Result Value Ref Range   Influenza A, POC Negative Negative   Influenza B, POC Negative Negative   Labs Reviewed  SARS CORONAVIRUS 2 (TAT 6-24 HRS)  POCT INFLUENZA A/B    Allergies  Allergen Reactions   Sulfa Antibiotics Other (See Comments) and Shortness Of Breath    tremors   Tramadol Other (See Comments)    headaches   Dexlansoprazole Other (See Comments)   Penicillin G Other (See Comments)    Childhood, pt states she cant remember what reaction she had; took amoxicillin w/o problem   Rabeprazole Other (See Comments)    Past Medical History:  Diagnosis Date   Anxiety    Gastritis    GERD (gastroesophageal reflux disease)    Muscle weakness    Seasonal allergies    Social History   Socioeconomic History   Marital status: Single    Spouse name: Not on file   Number of children: 0   Years of education: 11   Highest education level: Not on file  Occupational History   Occupation: Student  Tobacco Use   Smoking status: Never   Smokeless tobacco: Never  Vaping Use   Vaping Use: Never used  Substance and Sexual Activity   Alcohol use: No   Drug use: No   Sexual activity: Not Currently    Partners: Male    Birth control/protection: None  Other Topics Concern   Not on file  Social History Narrative   Lives at home with mother and father.   Right-handed.   No caffeine use.   Social Determinants of Health   Financial Resource Strain: Low Risk  (03/02/2020)   Overall Financial Resource Strain (CARDIA)    Difficulty of Paying Living Expenses: Not hard at all  Recent Concern: Financial Resource Strain - Medium Risk (02/05/2020)   Overall  Financial Resource Strain (CARDIA)    Difficulty of Paying Living Expenses: Somewhat hard  Food Insecurity: No Food Insecurity (03/02/2020)   Hunger Vital Sign    Worried About Running Out of Food in the Last Year: Never true    Ran Out of Food in the Last Year: Never true  Transportation Needs: No Transportation Needs (03/02/2020)   PRAPARE - Administrator, Civil Service (Medical): No    Lack of Transportation (Non-Medical): No  Physical Activity: Insufficiently Active (03/02/2020)   Exercise Vital Sign    Days of Exercise per Week: 2 days    Minutes of Exercise per Session: 20 min  Stress: No Stress Concern Present (03/02/2020)   [employer] of Occupational Health - Occupational Stress Questionnaire    Feeling of Stress : Only a little  Recent Concern: Stress - Stress Concern Present (02/05/2020)   [employer] of Occupational Health - Occupational Stress Questionnaire    Feeling of Stress : To some extent  Social Connections: Moderately Isolated (03/02/2020)   Social Connection and Isolation Panel [NHANES]    Frequency of Communication with Friends and Family: More than three times a week    Frequency of Social Gatherings with Friends and Family: Twice a week    Attends Religious Services: More than 4 times per year    Active Member of [employer] or Organizations: No    Attends Banker Meetings: Never    Marital Status: Never married  Intimate Partner Violence: Not At Risk (03/02/2020)   Humiliation, Afraid, Rape, and Kick questionnaire    Fear of Current or Ex-Partner: No    Emotionally Abused: No    Physically Abused: No    Sexually Abused: No   Family History  Problem Relation Age of Onset   Diabetes Mother    Other Mother        gallstones   Hypertension Mother    Heart disease Mother    Hyperlipidemia Father    Other Sister        pre-diabetes   Other Sister        gallstones   Clotting disorder Maternal Grandfather    Diabetes Maternal  Grandfather    Heart disease Maternal Grandfather    Breast cancer Paternal Grandmother    Migraines Neg Hx    Headache Neg Hx    Past Surgical History:  Procedure Laterality Date   CESAREAN SECTION N/A 05/21/2020   Procedure: CESAREAN SECTION;  Surgeon: [name], MD;  Location: [facility];  Service: Obstetrics;  Laterality: N/A;   NO PAST SURGERIES       Mardella Layman, MD 01/31/23 [phone]

## 2023-02-14 ENCOUNTER — Ambulatory Visit: Payer: Medicaid Other | Admitting: Family Medicine

## 2023-02-15 ENCOUNTER — Other Ambulatory Visit: Payer: Medicaid Other

## 2023-02-21 ENCOUNTER — Ambulatory Visit
Admission: RE | Admit: 2023-02-21 | Discharge: 2023-02-21 | Disposition: A | Discharge status: Home / Self Care | Payer: Medicaid Other | Source: Ambulatory Visit | Attending: Neurology | Admitting: Neurology

## 2023-02-21 DIAGNOSIS — G441 Vascular headache, not elsewhere classified: Secondary | ICD-10-CM

## 2023-02-28 NOTE — Progress Notes (Deleted)
No chief complaint on file.   HISTORY OF PRESENT ILLNESS:  02/28/23 ALL:  Bethany Barrera is a 27 y.o. female here today for follow up for  headaches. She was seen in consult with Dr Lucia Gaskins 11/2022. Concerns of IIH versus migraines. MRI ordered... Labs were unremarkable. Referral to Dr Dione Booze placed.   Since,    HISTORY (copied from Dr Trevor Mace previous note)  HPI:  Bethany Barrera is a 27 y.o. female here as requested by Melene Plan, DO for headaches. In her head she feels like its in her eyes, in the back of the head, radiates to the back of the head and feels like inside her head. Feels like its in the middle of her head. More tightness, positional when she gets up feel beating in her head, like she can feel her heartbeat inside her head, she has vision changes, she will see spots in her eyes, in both eyes, tightness around the head. No ear fullness. Started 3 years ago. Getting worse. Recently worsening over 5 months, daily, mother here and provides information but doesn't hear snoring. Last eye exam was a few weeks ago and was fine [facility] not an ophthalmologist or optometrist. She has some sound sensitivity concerts make it worse. No nausea. Gets worse after she gets out of bed and worsens all day long. Praying makes it better. Not tired during the day.    Reviewed notes, labs and imaging from outside physicians, which showed:   01/06/2016 MRI: FINDINGS: There is no evidence of acute infarct, intracranial hemorrhage, mass, midline shift, or extra-axial fluid collection. Ventricles and sulci are normal. The brain is normal in signal without evidence of demyelinating disease.   Orbits are unremarkable. No fluid in the paranasal sinuses are mastoid air cells. Major intracranial vascular flow voids are preserved.   IMPRESSION: Unremarkable appearance of the brain.   MRI cervical spine: FINDINGS:  Normal signal is present in the cervical and upper thoracic spinal   cord to the lowest imaged level, T1-2. Marrow signal, vertebral body  heights, and alignment are normal. There straightening of the normal  cervical lordosis. Craniocervical junction is within normal limits.  The visualized intracranial contents are normal.   Flow is present within the vertebral arteries.   Minimal disc bulging is present at C5-6 and C6-7. There is no  significant focal disc protrusion or stenosis.   IMPRESSION:  1. Minimal disc bulging at C5-6 and C6-7 without a significant  protrusion or focal stenosis.  2. Straightening of the normal cervical lordosis. This is  nonspecific, but can be seen in the setting of muscle strain or  ongoing pain.    REVIEW OF SYSTEMS: Out of a complete 14 system review of symptoms, the patient complains only of the following symptoms, and all other reviewed systems are negative.   ALLERGIES: Allergies  Allergen Reactions   Sulfa Antibiotics Other (See Comments) and Shortness Of Breath    tremors   Tramadol Other (See Comments)    headaches   Dexlansoprazole Other (See Comments)   Penicillin G Other (See Comments)    Childhood, pt states she cant remember what reaction she had; took amoxicillin w/o problem   Rabeprazole Other (See Comments)     HOME MEDICATIONS: Outpatient Medications Prior to Visit  Medication Sig Dispense Refill   Ascorbic Acid (VITAMIN C PO) Take by mouth.     baclofen (LIORESAL) 10 MG tablet Take 1 tablet by mouth as needed.  diazepam (VALIUM) 10 MG tablet Take by mouth as needed.     esomeprazole (NEXIUM) 40 MG capsule TAKE 1 CAPSULE BY MOUTH EVERY DAY 20 MINUTES BEFORE BREAKFAST 30 capsule 12   ferrous sulfate 325 (65 FE) MG tablet Take 1 tablet by mouth daily.     MAGNESIUM GLYCINATE PO Take 100 mg by mouth. 2 gummies daily     meloxicam (MOBIC) 15 MG tablet Take 1 tablet by mouth daily.     predniSONE (DELTASONE) 20 MG tablet 10 mg 2 (two) times a week.     promethazine-dextromethorphan  (PROMETHAZINE-DM) 6.25-15 MG/5ML syrup Take 5 mLs by mouth 4 (four) times daily as needed for cough. 118 mL 0   VITAMIN D PO Take 2,000 Units by mouth daily.     Vitamin D, Ergocalciferol, (DRISDOL) 1.25 MG (50000 UNIT) CAPS capsule Take 50,000 Units by mouth once a week.     No facility-administered medications prior to visit.     PAST MEDICAL HISTORY: Past Medical History:  Diagnosis Date   Anxiety    Gastritis    GERD (gastroesophageal reflux disease)    Muscle weakness    Seasonal allergies      PAST SURGICAL HISTORY: Past Surgical History:  Procedure Laterality Date   CESAREAN SECTION N/A 05/21/2020   Procedure: CESAREAN SECTION;  Surgeon: [name], MD;  Location: [facility];  Service: Obstetrics;  Laterality: N/A;   NO PAST SURGERIES       FAMILY HISTORY: Family History  Problem Relation Age of Onset   Diabetes Mother    Other Mother        gallstones   Hypertension Mother    Heart disease Mother    Hyperlipidemia Father    Other Sister        pre-diabetes   Other Sister        gallstones   Clotting disorder Maternal Grandfather    Diabetes Maternal Grandfather    Heart disease Maternal Grandfather    Breast cancer Paternal Grandmother    Migraines Neg Hx    Headache Neg Hx      SOCIAL HISTORY: Social History   Socioeconomic History   Marital status: Single    Spouse name: Not on file   Number of children: 0   Years of education: 11   Highest education level: Not on file  Occupational History   Occupation: Consulting civil engineer  Tobacco Use   Smoking status: Never   Smokeless tobacco: Never  Vaping Use   Vaping Use: Never used  Substance and Sexual Activity   Alcohol use: No   Drug use: No   Sexual activity: Not Currently    Partners: Male    Birth control/protection: None  Other Topics Concern   Not on file  Social History Narrative   Lives at home with mother and father.   Right-handed.   No caffeine use.   Social Determinants of Health    Financial Resource Strain: Low Risk  (03/02/2020)   Overall Financial Resource Strain (CARDIA)    Difficulty of Paying Living Expenses: Not hard at all  Recent Concern: Financial Resource Strain - Medium Risk (02/05/2020)   Overall Financial Resource Strain (CARDIA)    Difficulty of Paying Living Expenses: Somewhat hard  Food Insecurity: No Food Insecurity (03/02/2020)   Hunger Vital Sign    Worried About Running Out of Food in the Last Year: Never true    Ran Out of Food in the Last Year: Never true  Transportation  Needs: No Transportation Needs (03/02/2020)   PRAPARE - Administrator, Civil Service (Medical): No    Lack of Transportation (Non-Medical): No  Physical Activity: Insufficiently Active (03/02/2020)   Exercise Vital Sign    Days of Exercise per Week: 2 days    Minutes of Exercise per Session: 20 min  Stress: No Stress Concern Present (03/02/2020)   [employer] of Occupational Health - Occupational Stress Questionnaire    Feeling of Stress : Only a little  Recent Concern: Stress - Stress Concern Present (02/05/2020)   [employer] of Occupational Health - Occupational Stress Questionnaire    Feeling of Stress : To some extent  Social Connections: Moderately Isolated (03/02/2020)   Social Connection and Isolation Panel [NHANES]    Frequency of Communication with Friends and Family: More than three times a week    Frequency of Social Gatherings with Friends and Family: Twice a week    Attends Religious Services: More than 4 times per year    Active Member of [employer] or Organizations: No    Attends Banker Meetings: Never    Marital Status: Never married  Intimate Partner Violence: Not At Risk (03/02/2020)   Humiliation, Afraid, Rape, and Kick questionnaire    Fear of Current or Ex-Partner: No    Emotionally Abused: No    Physically Abused: No    Sexually Abused: No     PHYSICAL EXAM  There were no vitals filed for this visit. There is  no height or weight on file to calculate BMI.  Generalized: Well developed, in no acute distress  Cardiology: normal rate and rhythm, no murmur auscultated  Respiratory: clear to auscultation bilaterally    Neurological examination  Mentation: Alert oriented to time, place, history taking. Follows all commands speech and language fluent Cranial nerve II-XII: Pupils were equal round reactive to light. Extraocular movements were full, visual field were full on confrontational test. Facial sensation and strength were normal. Uvula tongue midline. Head turning and shoulder shrug  were normal and symmetric. Motor: The motor testing reveals 5 over 5 strength of all 4 extremities. Good symmetric motor tone is noted throughout.  Sensory: Sensory testing is intact to soft touch on all 4 extremities. No evidence of extinction is noted.  Coordination: Cerebellar testing reveals good finger-nose-finger and heel-to-shin bilaterally.  Gait and station: Gait is normal. Tandem gait is normal. Romberg is negative. No drift is seen.  Reflexes: Deep tendon reflexes are symmetric and normal bilaterally.    DIAGNOSTIC DATA (LABS, IMAGING, TESTING) - I reviewed patient records, labs, notes, testing and imaging myself where available.  Lab Results  Component Value Date   WBC 5.3 12/05/2022   HGB 12.8 12/05/2022   HCT 40.3 12/05/2022   MCV 77 (L) 12/05/2022   PLT 414 12/05/2022      Component Value Date/Time   NA 140 12/05/2022 1531   K 4.4 12/05/2022 1531   CL 101 12/05/2022 1531   CO2 23 12/05/2022 1531   GLUCOSE 89 12/05/2022 1531   GLUCOSE 93 04/30/2019 1430   BUN 9 12/05/2022 1531   CREATININE 0.78 12/05/2022 1531   CREATININE 0.66 04/30/2019 1430   CALCIUM 9.5 12/05/2022 1531   PROT 7.6 12/05/2022 1531   ALBUMIN 4.7 12/05/2022 1531   AST 13 12/05/2022 1531   ALT 6 12/05/2022 1531   ALKPHOS 104 12/05/2022 1531   BILITOT 0.2 12/05/2022 1531   GFRNONAA >60 05/21/2020 1028   GFRNONAA 123  04/30/2019 1430  GFRAA >60 05/21/2020 1028   GFRAA 142 04/30/2019 1430   No results found for: "CHOL", "HDL", "LDLCALC", "LDLDIRECT", "TRIG", "CHOLHDL" No results found for: "HGBA1C" Lab Results  Component Value Date   VITAMINB12 725 02/27/2016   Lab Results  Component Value Date   TSH 2.870 12/05/2022        No data to display               No data to display           ASSESSMENT AND PLAN  27 y.o. year old female  has a past medical history of Anxiety, Gastritis, GERD (gastroesophageal reflux disease), Muscle weakness, and Seasonal allergies. here with    No diagnosis found.  Marcelyn Bruins ***.  Healthy lifestyle habits encouraged. *** will follow up with PCP as directed. *** will return to see me in ***, sooner if needed. *** verbalizes understanding and agreement with this plan.   No orders of the defined types were placed in this encounter.    No orders of the defined types were placed in this encounter.    Shawnie Dapper, MSN, FNP-C 02/28/2023, 10:02 AM  [facility] [address] [place], [place] [address] [phone]

## 2023-03-05 ENCOUNTER — Telehealth: Payer: Self-pay | Admitting: Neurology

## 2023-03-05 ENCOUNTER — Ambulatory Visit: Payer: Medicaid Other | Admitting: Family Medicine

## 2023-03-05 NOTE — Telephone Encounter (Signed)
Pt stated she needs for Dr. Lucia Gaskins to order another MRI. Stated they weren't able to get good pictures on the first MRI.

## 2023-03-06 NOTE — Telephone Encounter (Signed)
[employer] medicaid Berkley Harvey: [id number] exp. 03/06/23-04/20/23 sent to [facility] [phone]

## 2023-03-18 ENCOUNTER — Ambulatory Visit: Payer: Medicaid Other | Admitting: Women's Health

## 2023-03-20 NOTE — Progress Notes (Deleted)
Office Visit Note  Patient: Bethany Barrera             Date of Birth: Nov 14, 1995           MRN: [id number]             PCP: Donita Brooks, MD Referring: Marva Panda, NP Visit Date: 04/03/2023 Occupation: @GUAROCC @  Subjective:  No chief complaint on file.   History of Present Illness: Bethany Barrera is a 27 y.o. female ***     Activities of Daily Living:  Patient reports morning stiffness for *** {minute/hour:19697}.   Patient {ACTIONS;DENIES/REPORTS:21021675::"Denies"} nocturnal pain.  Difficulty dressing/grooming: {ACTIONS;DENIES/REPORTS:21021675::"Denies"} Difficulty climbing stairs: {ACTIONS;DENIES/REPORTS:21021675::"Denies"} Difficulty getting out of chair: {ACTIONS;DENIES/REPORTS:21021675::"Denies"} Difficulty using hands for taps, buttons, cutlery, and/or writing: {ACTIONS;DENIES/REPORTS:21021675::"Denies"}  No Rheumatology ROS completed.   PMFS History:  Patient Active Problem List   Diagnosis Date Noted  . Chronic intractable headache 12/06/2022  . Previous cesarean section 07/06/2020  . Postpartum hypertension 07/06/2020  . Chronic pain w/ +ANA Antibody 03/02/2020  . Anxiety 02/05/2020  . History of anxiety 07/29/2018  . Positive ANA (antinuclear antibody) 05/23/2018  . Vitamin D deficiency 02/25/2018  . Paroxysmal atrial tachycardia by electrocardiogram 02/22/2016  . Paresthesia 01/11/2016  . GERD (gastroesophageal reflux disease)     Past Medical History:  Diagnosis Date  . Anxiety   . Gastritis   . GERD (gastroesophageal reflux disease)   . Muscle weakness   . Seasonal allergies     Family History  Problem Relation Age of Onset  . Diabetes Mother   . Other Mother        gallstones  . Hypertension Mother   . Heart disease Mother   . Hyperlipidemia Father   . Other Sister        pre-diabetes  . Other Sister        gallstones  . Clotting disorder Maternal Grandfather   . Diabetes Maternal Grandfather   . Heart disease  Maternal Grandfather   . Breast cancer Paternal Grandmother   . Migraines Neg Hx   . Headache Neg Hx    Past Surgical History:  Procedure Laterality Date  . CESAREAN SECTION N/A 05/21/2020   Procedure: CESAREAN SECTION;  Surgeon: [name], MD;  Location: [facility];  Service: Obstetrics;  Laterality: N/A;  . NO PAST SURGERIES     Social History   Social History Narrative   Lives at home with mother and father.   Right-handed.   No caffeine use.   Immunization History  Administered Date(s) Administered  . DTaP 01/25/1994, 04/02/1994, 06/18/1994, 11/26/1994  . HIB (PRP-OMP) 01/25/1994, 04/02/1994, 06/18/1994, 11/26/1994  . Hepatitis B Nov 05, 1995, 01/25/1994, 10/21/1995  . IPV 01/25/1994, 04/02/1994, 06/18/1994  . MMR 11/26/1994     Objective: Vital Signs: There were no vitals taken for this visit.   Physical Exam   Musculoskeletal Exam: ***  CDAI Exam: CDAI Score: -- Patient Global: --; Provider Global: -- Swollen: --; Tender: -- Joint Exam 04/03/2023   No joint exam has been documented for this visit   There is currently no information documented on the homunculus. Go to the Rheumatology activity and complete the homunculus joint exam.  Investigation: No additional findings.  Imaging: No results found.  Recent Labs: Lab Results  Component Value Date   WBC 5.3 12/05/2022   HGB 12.8 12/05/2022   PLT 414 12/05/2022   NA 140 12/05/2022   K 4.4 12/05/2022   CL 101 12/05/2022   CO2 23 12/05/2022  GLUCOSE 89 12/05/2022   BUN 9 12/05/2022   CREATININE 0.78 12/05/2022   BILITOT 0.2 12/05/2022   ALKPHOS 104 12/05/2022   AST 13 12/05/2022   ALT 6 12/05/2022   PROT 7.6 12/05/2022   ALBUMIN 4.7 12/05/2022   CALCIUM 9.5 12/05/2022   GFRAA >60 05/21/2020    Speciality Comments: No specialty comments available.  Procedures:  No procedures performed Allergies: Sulfa antibiotics, Tramadol, Dexlansoprazole, Penicillin g, and Rabeprazole   Assessment /  Plan:     Visit Diagnoses: Generalized pain - Previous patient-workup in 2019.  11/30/22: CK 39, C3 WNL, C4-61-elevated  Positive ANA (antinuclear antibody) - 04/30/19: ANA 1:80NS. 12/24/17: dsDNA negative, Smith-, Pan-ANCA-, RF-  Elevated C-reactive protein (CRP) - 12/25/27: CRP 39.4  Myalgia  Muscle weakness  Pain in thoracic spine  Chronic pain of both knees  History of gastroesophageal reflux (GERD)  History of gastritis  History of anxiety  Vitamin D deficiency  Paroxysmal atrial tachycardia by electrocardiogram  Orders: No orders of the defined types were placed in this encounter.  No orders of the defined types were placed in this encounter.   Face-to-face time spent with patient was *** minutes. Greater than 50% of time was spent in counseling and coordination of care.  Follow-Up Instructions: No follow-ups on file.   Gearldine Bienenstock, PA-C  Note - This record has been created using Dragon software.  Chart creation errors have been sought, but may not always  have been located. Such creation errors do not reflect on  the standard of medical care.

## 2023-03-27 ENCOUNTER — Ambulatory Visit: Payer: Medicaid Other | Admitting: Family Medicine

## 2023-04-03 ENCOUNTER — Encounter: Payer: Self-pay | Admitting: Rheumatology

## 2023-04-03 ENCOUNTER — Encounter: Payer: Medicaid Other | Admitting: Rheumatology

## 2023-04-03 ENCOUNTER — Ambulatory Visit: Payer: Medicaid Other | Attending: Rheumatology | Admitting: Rheumatology

## 2023-04-03 VITALS — BP 127/85 | HR 99 | Resp 15 | Ht 65.5 in | Wt 267.8 lb

## 2023-04-03 DIAGNOSIS — Z862 Personal history of diseases of the blood and blood-forming organs and certain disorders involving the immune mechanism: Secondary | ICD-10-CM

## 2023-04-03 DIAGNOSIS — R5383 Other fatigue: Secondary | ICD-10-CM

## 2023-04-03 DIAGNOSIS — Z8719 Personal history of other diseases of the digestive system: Secondary | ICD-10-CM

## 2023-04-03 DIAGNOSIS — M6281 Muscle weakness (generalized): Secondary | ICD-10-CM

## 2023-04-03 DIAGNOSIS — R7309 Other abnormal glucose: Secondary | ICD-10-CM

## 2023-04-03 DIAGNOSIS — M2141 Flat foot [pes planus] (acquired), right foot: Secondary | ICD-10-CM | POA: Diagnosis not present

## 2023-04-03 DIAGNOSIS — M791 Myalgia, unspecified site: Secondary | ICD-10-CM

## 2023-04-03 DIAGNOSIS — R52 Pain, unspecified: Secondary | ICD-10-CM

## 2023-04-03 DIAGNOSIS — E559 Vitamin D deficiency, unspecified: Secondary | ICD-10-CM

## 2023-04-03 DIAGNOSIS — M2550 Pain in unspecified joint: Secondary | ICD-10-CM | POA: Diagnosis not present

## 2023-04-03 DIAGNOSIS — E782 Mixed hyperlipidemia: Secondary | ICD-10-CM

## 2023-04-03 DIAGNOSIS — G4709 Other insomnia: Secondary | ICD-10-CM

## 2023-04-03 DIAGNOSIS — R768 Other specified abnormal immunological findings in serum: Secondary | ICD-10-CM

## 2023-04-03 DIAGNOSIS — Z8659 Personal history of other mental and behavioral disorders: Secondary | ICD-10-CM

## 2023-04-03 DIAGNOSIS — M2142 Flat foot [pes planus] (acquired), left foot: Secondary | ICD-10-CM

## 2023-04-03 DIAGNOSIS — K219 Gastro-esophageal reflux disease without esophagitis: Secondary | ICD-10-CM

## 2023-04-03 DIAGNOSIS — N946 Dysmenorrhea, unspecified: Secondary | ICD-10-CM

## 2023-04-03 DIAGNOSIS — G8929 Other chronic pain: Secondary | ICD-10-CM

## 2023-04-03 DIAGNOSIS — R7982 Elevated C-reactive protein (CRP): Secondary | ICD-10-CM

## 2023-04-03 DIAGNOSIS — I4719 Other supraventricular tachycardia: Secondary | ICD-10-CM

## 2023-04-03 DIAGNOSIS — M546 Pain in thoracic spine: Secondary | ICD-10-CM

## 2023-04-03 DIAGNOSIS — M62838 Other muscle spasm: Secondary | ICD-10-CM

## 2023-04-03 NOTE — Progress Notes (Signed)
Office Visit Note  Patient: Bethany Barrera             Date of Birth: Nov 24, 1995           MRN: [id number]             PCP: Donita Brooks, MD Referring: Marva Panda, NP Visit Date: 04/03/2023 Occupation: @GUAROCC @  Subjective:  Pain in all the muscles  History of Present Illness: Bethany Barrera is a 27 y.o. female seen in consultation per request of her PCP.  According the patient she started having generalized pain as a teenager.  In 2017 the pain got worse.  She states she is unable to do normal activities without being in pain.  She describes pain to be in all of her muscle groups.  She has worse pain in the trapezius region, and her spine in the scalp region.  She also gives history of hyperalgesia.  She has increased sensitivity to light and sound.  She also has insomnia secondary to discomfort.  She gives history of fatigue.  There is no history of joint swelling.  She has some joint discomfort after activities.  There is no history of oral ulcers, nasal ulcers, sicca symptoms, malar rash, photosensitivity, Raynaud's phenomenon or lymphadenopathy.  There is no family history of autoimmune disease or fibromyalgia.  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  She is gravida 1, para 1.  She has a 26-1/2 years old daughter.  She enjoys walking, make-up and traveling.  She has a fianc.  She is currently not using any contraception.  She gives history of dysmenorrhea.  She has been evaluated by Dr. Lucia Gaskins.  Patient states some of the studies are pending. Chart review:  Patient was evaluated by me on May 2019.  At that time ANA was positive and ENA panel was negative.  She had no clinical features of autoimmune disease.  X-rays of thoracic and lumbar spine were obtained at that visit which were unremarkable.  History of elevated sed rate and CRP since 2017.  MRI of brain 2017 unremarkable.  Nerve conduction velocity and EMG 2017 normal study.  CT soft tissue neck  with contrast 2017 negative.    Activities of Daily Living:  Patient reports morning stiffness for 1 hour.   Patient Reports nocturnal pain.  Difficulty dressing/grooming: Denies Difficulty climbing stairs: Denies Difficulty getting out of chair: Denies Difficulty using hands for taps, buttons, cutlery, and/or writing: Denies  Review of Systems  Constitutional:  Positive for fatigue.  HENT:  Negative for mouth sores and mouth dryness.   Eyes:  Negative for dryness.  Respiratory:  Negative for shortness of breath.   Cardiovascular:  Negative for chest pain and palpitations.  Gastrointestinal:  Positive for constipation. Negative for blood in stool and diarrhea.  Endocrine: Negative for increased urination.  Genitourinary:  Negative for involuntary urination.  Musculoskeletal:  Positive for joint pain, joint pain, myalgias, muscle weakness, morning stiffness, muscle tenderness and myalgias. Negative for gait problem and joint swelling.  Skin:  Negative for color change, rash, hair loss and sensitivity to sunlight.  Allergic/Immunologic: Positive for susceptible to infections.  Neurological:  Positive for dizziness and headaches.  Hematological:  Negative for swollen glands.  Psychiatric/Behavioral:  Positive for depressed mood and sleep disturbance. The patient is nervous/anxious.     PMFS History:  Patient Active Problem List   Diagnosis Date Noted   Chronic intractable headache 12/06/2022   Previous cesarean section 07/06/2020  Postpartum hypertension 07/06/2020   Chronic pain w/ +ANA Antibody 03/02/2020   Anxiety 02/05/2020   History of anxiety 07/29/2018   Positive ANA (antinuclear antibody) 05/23/2018   Vitamin D deficiency 02/25/2018   Paroxysmal atrial tachycardia by electrocardiogram 02/22/2016   Paresthesia 01/11/2016   GERD (gastroesophageal reflux disease)     Past Medical History:  Diagnosis Date   Anxiety    Gastritis    GERD (gastroesophageal reflux  disease)    Muscle weakness    Seasonal allergies     Family History  Problem Relation Age of Onset   Diabetes Mother    Other Mother        gallstones   Hypertension Mother    Heart disease Mother    Hyperlipidemia Father    Other Sister        pre-diabetes   Other Sister        gallstones   Clotting disorder Maternal Grandfather    Diabetes Maternal Grandfather    Heart disease Maternal Grandfather    Breast cancer Paternal Grandmother    Healthy Daughter    Migraines Neg Hx    Headache Neg Hx    Past Surgical History:  Procedure Laterality Date   CESAREAN SECTION N/A 05/21/2020   Procedure: CESAREAN SECTION;  Surgeon: [name], MD;  Location: [facility];  Service: Obstetrics;  Laterality: N/A;   NO PAST SURGERIES     Social History   Social History Narrative   Lives at home with mother and father.   Right-handed.   No caffeine use.   Immunization History  Administered Date(s) Administered   DTaP 01/25/1994, 04/02/1994, 06/18/1994, 11/26/1994   HIB (PRP-OMP) 01/25/1994, 04/02/1994, 06/18/1994, 11/26/1994   Hepatitis B 1994[birth year]09[birth year]16, 01/25/1994, 10/21/1995   IPV 01/25/1994, 04/02/1994, 06/18/1994   MMR 11/26/1994     Objective: Vital Signs: BP 127/85 (BP Location: Left Wrist, Patient Position: Sitting, Cuff Size: Normal)   Pulse 99   Resp 15   Ht 5' 5.5" (1.664 m)   Wt 267 lb 12.8 oz (121.5 kg)   BMI 43.89 kg/m    Physical Exam Vitals and nursing note reviewed.  Constitutional:      Appearance: She is well-developed.  HENT:     Head: Normocephalic and atraumatic.  Eyes:     Conjunctiva/sclera: Conjunctivae normal.  Cardiovascular:     Rate and Rhythm: Normal rate and regular rhythm.     Heart sounds: Normal heart sounds.  Pulmonary:     Effort: Pulmonary effort is normal.     Breath sounds: Normal breath sounds.  Abdominal:     General: Bowel sounds are normal.     Palpations: Abdomen is soft.  Musculoskeletal:     Cervical back: Normal  range of motion.  Lymphadenopathy:     Cervical: No cervical adenopathy.  Skin:    General: Skin is warm and dry.     Capillary Refill: Capillary refill takes less than 2 seconds.  Neurological:     Mental Status: She is alert and oriented to person, place, and time.  Psychiatric:        Behavior: Behavior normal.      Musculoskeletal Exam: Cervical, thoracic and lumbar spine were in good range of motion.  She had discomfort range of motion of her thoracic and lumbar spine without any point tenderness.  She had bilateral trapezius spasm.  Shoulder joints, elbow joints, wrist joints, MCPs PIPs and DIPs were in good range of motion with no synovitis.  Hip  joints and knee joints were in good range of motion.  No warmth swelling or effusion was noted.  There was no tenderness over ankles or MTPs.  Bilateral pes planus was noted.  No synovitis was noted.  She had generalized hyperalgesia.   CDAI Exam: CDAI Score: -- Patient Global: --; Provider Global: -- Swollen: --; Tender: -- Joint Exam 04/03/2023   No joint exam has been documented for this visit   There is currently no information documented on the homunculus. Go to the Rheumatology activity and complete the homunculus joint exam.  Investigation: No additional findings.  Imaging: No results found.  Recent Labs: Lab Results  Component Value Date   WBC 5.3 12/05/2022   HGB 12.8 12/05/2022   PLT 414 12/05/2022   NA 140 12/05/2022   K 4.4 12/05/2022   CL 101 12/05/2022   CO2 23 12/05/2022   GLUCOSE 89 12/05/2022   BUN 9 12/05/2022   CREATININE 0.78 12/05/2022   BILITOT 0.2 12/05/2022   ALKPHOS 104 12/05/2022   AST 13 12/05/2022   ALT 6 12/05/2022   PROT 7.6 12/05/2022   ALBUMIN 4.7 12/05/2022   CALCIUM 9.5 12/05/2022   GFRAA >60 05/21/2020    September 22, 2022 B12 normal, folate normal, iron saturation low, hemoglobin A1c 5.7, TSH normal, C-reactive protein 34.1, centromere antibody negative, ribosomal P antibody  negative, SSA negative, SSB negative, SCL 70 negative, Jo 1 negative, double-stranded DNA negative, ANA 1: 80NS, anti-CCP negative, MCV negative, LDL 144  Feb 25, 2018 SPEP unremarkable, immunoglobulins normal, ANCA negative, MPO negative, serum proteinase 3 negative, ANA 1: 160NS, Smith negative, double-stranded DNA negative, CK 59, aldolase 3.7, ESR 41, RF negative  Speciality Comments: No specialty comments available.  Procedures:  No procedures performed Allergies: Sulfa antibiotics, Tramadol, Dexlansoprazole, Other, Penicillin g, and Rabeprazole   Assessment / Plan:     Visit Diagnoses: Polyarthralgia-patient gives history of generalized achiness.  She denies any history of joint swelling.  No synovitis was noted.  Myalgia -she complains of generalized muscle pain for many years.  She states her symptoms started as a teenager which got worse since 2017.  Patient was evaluated by me in 2019 at that time autoimmune workup was negative except for positive ANA.  She had no features of systemic lupus.  She continues to have no symptoms of systemic lupus but low titer positive ANA.  Her CK was normal in the past.  She had no muscular weakness on the examination today.  She has been also evaluated by Dr. Lucia Gaskins and further workup is pending.  She had extensive workup in the past including MRI of the brain, x-rays of the thoracic and lumbar spine, nerve conduction velocities and EMG.  Plan: CK.  Detailed counsel regarding fibromyalgia was provided.  Her symptoms are consistent with fibromyalgia.  I will refer her to water therapy.  Benefits of water aerobics and summing were discussed.  Need for regular stretching was discussed.  Patient had intolerance to Cymbalta in the past.  I discouraged the use of prednisone.  Patient states she takes prednisone sometimes on as needed basis.  Trapezius muscle spasm-she had bilateral trapezius spasm.  Pes planus of both feet-she has severe pes planus.  She will  benefit from orthotics.  I will refer her to podiatry.  Positive ANA (antinuclear antibody) -she has low titer positive ANA.  ENA panel was negative.  Patient wanted to have labs retested.  She denies any history of oral ulcers, nasal ulcers, malar rash,  photosensitivity, Raynaud's or lymphadenopathy.  There was no synovitis on the examination.  Plan: ANA, Anti-DNA antibody, double-stranded, C3 and C4  Other fatigue -she gives history of fatigue for many years.  Plan: Thyroglobulin antibody, Thyroid Peroxidase Antibodies (TPO) (REFL), TSH  Other insomnia-patient gives history of chronic insomnia.  Good sleep hygiene was discussed.  Vitamin D deficiency -she has history of vitamin D deficiency.  She is not taking vitamin D supplement currently.  Plan: VITAMIN D 25 Hydroxy (Vit-D Deficiency, Fractures)  Dysmenorrhea-she gives history of dysmenorrhea.  She is sexually active and not using any contraception.  Other medical problems are listed as follows:  Gastroesophageal reflux disease without esophagitis  Elevated hemoglobin A1c  Mixed hyperlipidemia  History of anemia  History of anxiety  Orders: Orders Placed This Encounter  Procedures   Thyroglobulin antibody   Thyroid Peroxidase Antibodies (TPO) (REFL)   CK   TSH   ANA   Anti-DNA antibody, double-stranded   C3 and C4   VITAMIN D 25 Hydroxy (Vit-D Deficiency, Fractures)   Ambulatory referral to Physical Therapy   Ambulatory referral to Podiatry   No orders of the defined types were placed in this encounter.   Follow-Up Instructions: Return for Polyarthralgia, myalgia, positive ANA.   Pollyann Savoy, MD  Note - This record has been created using Animal nutritionist.  Chart creation errors have been sought, but may not always  have been located. Such creation errors do not reflect on  the standard of medical care.

## 2023-04-03 NOTE — Patient Instructions (Signed)
Myofascial Pain Syndrome and Fibromyalgia Myofascial pain syndrome and fibromyalgia are both pain disorders. You may feel this pain mainly in your muscles. Myofascial pain syndrome: Always has tender points in the muscles that will cause pain when pressed (trigger points). The pain may come and go. Usually affects your neck, upper back, and shoulder areas. The pain often moves into your arms and hands. Fibromyalgia: Has muscle pains and tenderness that come and go. Is often associated with tiredness (fatigue) and sleep problems. Has trigger points. Tends to be long-lasting (chronic), but is not life-threatening. Fibromyalgia and myofascial pain syndrome are not the same. However, they often occur together. If you have both conditions, each can make the other worse. Both are common and can cause enough pain and fatigue to make day-to-day activities difficult. Both can be hard to diagnose because their symptoms are common in many other conditions. What are the causes? The exact causes of these conditions are not known. What increases the risk? You are more likely to develop either of these conditions if: You have a family history of the condition. You are female. You have certain triggers, such as: Spine disorders. An injury (trauma) or other physical stressors. Being under a lot of stress. Medical conditions such as osteoarthritis, rheumatoid arthritis, or lupus. What are the signs or symptoms? Fibromyalgia The main symptom of fibromyalgia is widespread pain and tenderness in your muscles. Pain is sometimes described as stabbing, shooting, or burning. You may also have: Tingling or numbness. Sleep problems and fatigue. Problems with attention and concentration (fibro fog). Other symptoms may include: Bowel and bladder problems. Headaches. Vision problems. Sensitivity to odors and noises. Depression or mood changes. Painful menstrual periods (dysmenorrhea). Dry skin or eyes. These  symptoms can vary over time. Myofascial pain syndrome Symptoms of myofascial pain syndrome include: Tight, ropy bands of muscle. Uncomfortable sensations in muscle areas. These may include aching, cramping, burning, numbness, tingling, and weakness. Difficulty moving certain parts of the body freely (poor range of motion). How is this diagnosed? This condition may be diagnosed by your symptoms and medical history. You will also have a physical exam. In general: Fibromyalgia is diagnosed if you have pain, fatigue, and other symptoms for more than 3 months, and symptoms cannot be explained by another condition. Myofascial pain syndrome is diagnosed if you have trigger points in your muscles, and those trigger points are tender and cause pain elsewhere in your body (referred pain). How is this treated? Treatment for these conditions depends on the type that you have. For fibromyalgia, a healthy lifestyle is the most important treatment including aerobic and strength exercises. Different types of medicines are used to help treat pain and include: NSAIDs. Medicines for treating depression. Medicines that help control seizures. Medicines that relax the muscles. Treatment for myofascial pain syndrome includes: Pain medicines, such as NSAIDs. Cooling and stretching of muscles. Massage therapy with myofascial release technique. Trigger point injections. Treating these conditions often requires a team of health care providers. These may include: Your primary care provider. A physical therapist. Complementary health care providers, such as massage therapists or acupuncturists. A psychiatrist for cognitive behavioral therapy. Follow these instructions at home: Medicines Take over-the-counter and prescription medicines only as told by your health care provider. Ask your health care provider if the medicine prescribed to you: Requires you to avoid driving or using machinery. Can cause constipation.  You may need to take these actions to prevent or treat constipation: Drink enough fluid to keep your urine pale   yellow. Take over-the-counter or prescription medicines. Eat foods that are high in fiber, such as beans, whole grains, and fresh fruits and vegetables. Limit foods that are high in fat and processed sugars, such as fried or sweet foods. Lifestyle  Do exercises as told by your health care provider or physical therapist. Practice relaxation techniques to control your stress. You may want to try: Biofeedback. Visual imagery. Hypnosis. Muscle relaxation. Yoga. Meditation. Maintain a healthy lifestyle. This includes eating a healthy diet and getting enough sleep. Do not use any products that contain nicotine or tobacco. These products include cigarettes, chewing tobacco, and vaping devices, such as e-cigarettes. If you need help quitting, ask your health care provider. General instructions Talk to your health care provider about complementary treatments, such as acupuncture or massage. Do not do activities that stress or strain your muscles. This includes repetitive motions and heavy lifting. Keep all follow-up visits. This is important. Where to find support Consider joining a support group with others who are diagnosed with this condition. National Fibromyalgia Association: fmaware.org Where to find more information [place]. Pain Foundation: uspainfoundation.org Contact a health care provider if: You have new symptoms. Your symptoms get worse or your pain is severe. You have side effects from your medicines. You have trouble sleeping. Your condition is causing depression or anxiety. Get help right away if: You have thoughts of hurting yourself or others. Get help right away if you feel like you may hurt yourself or others, or have thoughts about taking your own life. Go to your nearest emergency room or: Call 911. Call the National Suicide Prevention Lifeline at 1-800-273-8255  or 988. This is open 24 hours a day. Text the Crisis Text Line at 741741. This information is not intended to replace advice given to you by your health care provider. Make sure you discuss any questions you have with your health care provider. Document Revised: 07/02/2022 Document Reviewed: 08/25/2021 Elsevier Patient Education  2024 Elsevier Inc.  

## 2023-04-04 LAB — VITAMIN D 25 HYDROXY (VIT D DEFICIENCY, FRACTURES): Vit D, 25-Hydroxy: 44 ng/mL (ref 30–100)

## 2023-04-04 LAB — ANTI-DNA ANTIBODY, DOUBLE-STRANDED: ds DNA Ab: <1 IU/mL

## 2023-04-04 LAB — C3 AND C4: C3 Complement: 199 mg/dL — ABNORMAL HIGH (ref 83–193)

## 2023-04-04 LAB — TSH: TSH: 3.77 mIU/L

## 2023-04-04 LAB — CK: Total CK: 59 U/L (ref 29–143)

## 2023-04-05 LAB — C3 AND C4: C4 Complement: 51 mg/dL (ref 15–57)

## 2023-04-05 LAB — ANA: Anti Nuclear Antibody (ANA): POSITIVE — AB

## 2023-04-05 LAB — THYROGLOBULIN ANTIBODY: Thyroglobulin Ab: <1 IU/mL (ref <=1)

## 2023-04-05 LAB — ANTI-NUCLEAR AB-TITER (ANA TITER): ANA Titer 1: 1:80 {titer} — ABNORMAL HIGH

## 2023-04-05 LAB — THYROID PEROXIDASE ANTIBODIES (TPO) (REFL): Thyroperoxidase Ab SerPl-aCnc: <1 IU/mL (ref <9)

## 2023-04-07 NOTE — Progress Notes (Signed)
ANA low titer positive and stable.  Double-stranded DNA negative.  Complements are normal.  Thyroid antibodies are negative.  TSH is normal.  CK normal.  Labs do not indicate an active autoimmune disease.  I will discuss results at the follow-up visit.

## 2023-04-12 NOTE — Progress Notes (Deleted)
Office Visit Note  Patient: Bethany Barrera             Date of Birth: 04/28/1996           MRN: [id number]             PCP: Jim Like, NP Referring: Donita Brooks, MD Visit Date: 04/24/2023 Occupation: @GUAROCC @  Subjective:  No chief complaint on file.   History of Present Illness: Bethany Barrera is a 27 y.o. female ***     Activities of Daily Living:  Patient reports morning stiffness for *** {minute/hour:19697}.   Patient {ACTIONS;DENIES/REPORTS:21021675::"Denies"} nocturnal pain.  Difficulty dressing/grooming: {ACTIONS;DENIES/REPORTS:21021675::"Denies"} Difficulty climbing stairs: {ACTIONS;DENIES/REPORTS:21021675::"Denies"} Difficulty getting out of chair: {ACTIONS;DENIES/REPORTS:21021675::"Denies"} Difficulty using hands for taps, buttons, cutlery, and/or writing: {ACTIONS;DENIES/REPORTS:21021675::"Denies"}  No Rheumatology ROS completed.   PMFS History:  Patient Active Problem List   Diagnosis Date Noted   Chronic intractable headache 12/06/2022   Previous cesarean section 07/06/2020   Postpartum hypertension 07/06/2020   Chronic pain w/ +ANA Antibody 03/02/2020   Anxiety 02/05/2020   History of anxiety 07/29/2018   Positive ANA (antinuclear antibody) 05/23/2018   Vitamin D deficiency 02/25/2018   Paroxysmal atrial tachycardia by electrocardiogram 02/22/2016   Paresthesia 01/11/2016   GERD (gastroesophageal reflux disease)     Past Medical History:  Diagnosis Date   Anxiety    Gastritis    GERD (gastroesophageal reflux disease)    Muscle weakness    Seasonal allergies     Family History  Problem Relation Age of Onset   Diabetes Mother    Other Mother        gallstones   Hypertension Mother    Heart disease Mother    Hyperlipidemia Father    Other Sister        pre-diabetes   Other Sister        gallstones   Clotting disorder Maternal Grandfather    Diabetes Maternal Grandfather    Heart disease Maternal Grandfather     Breast cancer Paternal Grandmother    Healthy Daughter    Migraines Neg Hx    Headache Neg Hx    Past Surgical History:  Procedure Laterality Date   CESAREAN SECTION N/A 05/21/2020   Procedure: CESAREAN SECTION;  Surgeon: [name], MD;  Location: [facility];  Service: Obstetrics;  Laterality: N/A;   NO PAST SURGERIES     Social History   Social History Narrative   Lives at home with mother and father.   Right-handed.   No caffeine use.   Immunization History  Administered Date(s) Administered   DTaP 01/25/1994, 04/02/1994, 06/18/1994, 11/26/1994   HIB (PRP-OMP) 01/25/1994, 04/02/1994, 06/18/1994, 11/26/1994   Hepatitis B May 29, 1994, 01/25/1994, 10/21/1995   IPV 01/25/1994, 04/02/1994, 06/18/1994   MMR 11/26/1994     Objective: Vital Signs: There were no vitals taken for this visit.   Physical Exam   Musculoskeletal Exam: ***  CDAI Exam: CDAI Score: -- Patient Global: --; Provider Global: -- Swollen: --; Tender: -- Joint Exam 04/24/2023   No joint exam has been documented for this visit   There is currently no information documented on the homunculus. Go to the Rheumatology activity and complete the homunculus joint exam.  Investigation: No additional findings.  Imaging: No results found.  Recent Labs: Lab Results  Component Value Date   WBC 5.3 12/05/2022   HGB 12.8 12/05/2022   PLT 414 12/05/2022   NA 140 12/05/2022   K 4.4 12/05/2022   CL 101  12/05/2022   CO2 23 12/05/2022   GLUCOSE 89 12/05/2022   BUN 9 12/05/2022   CREATININE 0.78 12/05/2022   BILITOT 0.2 12/05/2022   ALKPHOS 104 12/05/2022   AST 13 12/05/2022   ALT 6 12/05/2022   PROT 7.6 12/05/2022   ALBUMIN 4.7 12/05/2022   CALCIUM 9.5 12/05/2022   GFRAA >60 05/21/2020   April 03, 2023 ANA 1: 80NS, dsDNA negative, C3-C4 normal, thyroglobulin antibody negative, TPO antibody negative, TSH normal, vitamin D 44, CK 59   September 22, 2022 B12 normal, folate normal, iron saturation low,  hemoglobin A1c 5.7, TSH normal, C-reactive protein 34.1, centromere antibody negative, ribosomal P antibody negative, SSA negative, SSB negative, SCL 70 negative, Jo 1 negative, double-stranded DNA negative, ANA 1: 80NS, anti-CCP negative, MCV negative, LDL 144   Feb 25, 2018 SPEP unremarkable, immunoglobulins normal, ANCA negative, MPO negative, serum proteinase 3 negative, ANA 1: 160NS, Smith negative, double-stranded DNA negative, CK 59, aldolase 3.7, ESR 41, RF negative  Speciality Comments: No specialty comments available.  Procedures:  No procedures performed Allergies: Sulfa antibiotics, Tramadol, Dexlansoprazole, Other, Penicillin g, and Rabeprazole   Assessment / Plan:     Visit Diagnoses: No diagnosis found.  Orders: No orders of the defined types were placed in this encounter.  No orders of the defined types were placed in this encounter.   Face-to-face time spent with patient was *** minutes. Greater than 50% of time was spent in counseling and coordination of care.  Follow-Up Instructions: No follow-ups on file.   Pollyann Savoy, MD  Note - This record has been created using Animal nutritionist.  Chart creation errors have been sought, but may not always  have been located. Such creation errors do not reflect on  the standard of medical care.

## 2023-04-23 ENCOUNTER — Ambulatory Visit: Payer: Medicaid Other | Admitting: Podiatry

## 2023-04-24 ENCOUNTER — Telehealth: Payer: Self-pay | Admitting: Neurology

## 2023-04-24 ENCOUNTER — Ambulatory Visit: Payer: Medicaid Other | Admitting: Rheumatology

## 2023-04-24 DIAGNOSIS — Z862 Personal history of diseases of the blood and blood-forming organs and certain disorders involving the immune mechanism: Secondary | ICD-10-CM

## 2023-04-24 DIAGNOSIS — K219 Gastro-esophageal reflux disease without esophagitis: Secondary | ICD-10-CM

## 2023-04-24 DIAGNOSIS — E782 Mixed hyperlipidemia: Secondary | ICD-10-CM

## 2023-04-24 DIAGNOSIS — M2141 Flat foot [pes planus] (acquired), right foot: Secondary | ICD-10-CM

## 2023-04-24 DIAGNOSIS — M797 Fibromyalgia: Secondary | ICD-10-CM

## 2023-04-24 DIAGNOSIS — N946 Dysmenorrhea, unspecified: Secondary | ICD-10-CM

## 2023-04-24 DIAGNOSIS — Z8659 Personal history of other mental and behavioral disorders: Secondary | ICD-10-CM

## 2023-04-24 DIAGNOSIS — R5383 Other fatigue: Secondary | ICD-10-CM

## 2023-04-24 DIAGNOSIS — E559 Vitamin D deficiency, unspecified: Secondary | ICD-10-CM

## 2023-04-24 DIAGNOSIS — R768 Other specified abnormal immunological findings in serum: Secondary | ICD-10-CM

## 2023-04-24 DIAGNOSIS — R7309 Other abnormal glucose: Secondary | ICD-10-CM

## 2023-04-24 DIAGNOSIS — G4709 Other insomnia: Secondary | ICD-10-CM

## 2023-04-24 DIAGNOSIS — M2550 Pain in unspecified joint: Secondary | ICD-10-CM

## 2023-04-24 DIAGNOSIS — M62838 Other muscle spasm: Secondary | ICD-10-CM

## 2023-04-24 NOTE — Telephone Encounter (Signed)
Bethany Barrera from [facility] states the authorization for the MRI has expired, another one is needed, please call her at

## 2023-04-25 NOTE — Telephone Encounter (Signed)
I sent the new auth to [facility]. [employer] medicaid Berkley Harvey: [id number] exp. 04/25/23-06/09/23

## 2023-04-30 ENCOUNTER — Other Ambulatory Visit (HOSPITAL_COMMUNITY)
Admission: RE | Admit: 2023-04-30 | Discharge: 2023-04-30 | Disposition: A | Discharge status: Home / Self Care | Payer: Medicaid Other | Source: Ambulatory Visit | Attending: Women's Health | Admitting: Women's Health

## 2023-04-30 ENCOUNTER — Ambulatory Visit (INDEPENDENT_AMBULATORY_CARE_PROVIDER_SITE_OTHER): Payer: Medicaid Other | Admitting: Women's Health

## 2023-04-30 ENCOUNTER — Encounter: Payer: Self-pay | Admitting: Women's Health

## 2023-04-30 VITALS — BP 136/86 | HR 96 | Ht 65.0 in | Wt 273.4 lb

## 2023-04-30 DIAGNOSIS — R102 Pelvic and perineal pain: Secondary | ICD-10-CM

## 2023-04-30 DIAGNOSIS — R300 Dysuria: Secondary | ICD-10-CM | POA: Diagnosis not present

## 2023-04-30 DIAGNOSIS — Z01419 Encounter for gynecological examination (general) (routine) without abnormal findings: Secondary | ICD-10-CM | POA: Insufficient documentation

## 2023-04-30 DIAGNOSIS — N946 Dysmenorrhea, unspecified: Secondary | ICD-10-CM

## 2023-04-30 LAB — POCT URINALYSIS DIPSTICK OB
Glucose, UA: NEGATIVE
Ketones, UA: NEGATIVE
Nitrite, UA: NEGATIVE
POC,PROTEIN,UA: NEGATIVE

## 2023-04-30 MED ORDER — LO LOESTRIN FE 1 MG-10 MCG / 10 MCG PO TABS: 1.0000 | ORAL_TABLET | Freq: Every day | ORAL | 3 refills | Status: AC

## 2023-04-30 NOTE — Addendum Note (Signed)
Addended by: Moss Mc on: 04/30/2023 03:39 PM   Modules accepted: Orders

## 2023-04-30 NOTE — Progress Notes (Signed)
WELL-WOMAN EXAMINATION Patient name: Bethany Barrera MRN [id number]  Date of birth: Apr 28, 1996 Chief Complaint:   Annual Exam (Patient complains of discomfort during urination)  History of Present Illness:   GUINEVERE Barrera is a 27 y.o. G57P1001 African-American female being seen today for a routine well-woman exam.  Current complaints: pressure w/ voiding and in am before voids for about 3wks, maybe some frequency, no burning.  Monthly periods, painful for last 6 months or so, ibuprofen doesn't help much. May skip a month 1 or 2x's/yr. Normal flow, 1st 1-2d a little heavier. Uses condoms for contraception, but wants to start COCs to help w/ painful periods. Sometimes notices vaginal odor. No itching/irritation or abnormal d/c.   PCP: [facility]      Patient's last menstrual period was 04/26/2023. The current method of family planning is condoms.  Last pap 03/02/20. Results were: NILM w/ HRHPV negative. H/O abnormal pap: no Last mammogram: n/a. Results were: N/A. Family h/o breast cancer: yes PGM Last colonoscopy: never. Results were: N/A. Family h/o colorectal cancer: no     04/30/2023    1:47 PM 03/02/2020   10:43 AM 02/05/2020    9:04 AM 11/07/2017    2:40 PM  Depression screen PHQ 2/9  Decreased Interest 0 1 1 1   Down, Depressed, Hopeless 1 0 0 1  PHQ - 2 Score 1 1 1 2   Altered sleeping 0 0 1 0  Tired, decreased energy 0 1 1 1   Change in appetite 0 0 0 0  Feeling bad or failure about yourself  0 0 0 0  Trouble concentrating 0 0 0 0  Moving slowly or fidgety/restless 0 0 0 0  Suicidal thoughts 0 0 0 0  PHQ-9 Score 1 2 3 3   Difficult doing work/chores  Not difficult at all  Somewhat difficult        04/30/2023    1:47 PM 03/02/2020   10:43 AM 02/05/2020    9:04 AM  GAD 7 : Generalized Anxiety Score  Nervous, Anxious, on Edge 1 1 1   Control/stop worrying 0 0 0  Worry too much - different things 0 0 1  Trouble relaxing 0 0 0  Restless 0 0 0  Easily annoyed  or irritable 0 0 0  Afraid - awful might happen 0 0 0  Total GAD 7 Score 1 1 2   Anxiety Difficulty  Not difficult at all      Review of Systems:   Pertinent items are noted in HPI Denies any headaches, blurred vision, fatigue, shortness of breath, chest pain, abdominal pain, abnormal vaginal discharge/itching/odor/irritation, problems with periods, bowel movements, urination, or intercourse unless otherwise stated above. Pertinent History Reviewed:  Reviewed past medical,surgical, social and family history.  Reviewed problem list, medications and allergies. Physical Assessment:   Vitals:   04/30/23 1345  BP: 136/86  Pulse: 96  Weight: 273 lb 5.8 oz (124 kg)  Height: 5\' 5"  (1.651 m)  Body mass index is 45.49 kg/m.        Physical Examination:   General appearance - well appearing, and in no distress  Mental status - alert, oriented to person, place, and time  Psych:  She has a normal mood and affect  Skin - warm and dry, normal color, no suspicious lesions noted  Chest - effort normal, all lung fields clear to auscultation bilaterally  Heart - normal rate and regular rhythm  Neck:  midline trachea, no thyromegaly or nodules  Breasts -  breasts appear normal, no suspicious masses, no skin or nipple changes or  axillary nodes  Abdomen - soft, nontender, nondistended, no masses or organomegaly  Pelvic - VULVA: normal appearing vulva with no masses, tenderness or lesions  VAGINA: normal appearing vagina with normal color and discharge, no lesions, on menses CERVIX: normal appearing cervix without discharge or lesions, no CMT  Thin prep pap is done w/ HR HPV cotesting  UTERUS: uterus is felt to be normal size, shape, consistency and nontender   ADNEXA: No adnexal masses or tenderness noted.  Extremities:  No swelling or varicosities noted  Chaperone: Jobe Marker    Results for orders placed or performed in visit on 04/30/23 (from the past 24 hour(s))  POC Urinalysis Dipstick  OB   Collection Time: 04/30/23  1:52 PM  Result Value Ref Range   Color, UA     Clarity, UA     Glucose, UA Negative Negative   Bilirubin, UA     Ketones, UA negative    Spec Grav, UA     Blood, UA 2+    pH, UA     POC,PROTEIN,UA Negative Negative, Trace, Small (1+), Moderate (2+), Large (3+), 4+   Urobilinogen, UA     Nitrite, UA negative    Leukocytes, UA Trace (A) Negative   Appearance     Odor      Assessment & Plan:  1) Well-Woman Exam  2) Pressure w/ voiding> will send culture  3) Dysmenorrhea> check vaginitis/STD on pap, rx LoLo per request, f/u  Labs/procedures today: pap w/ gc/ct  Mammogram: @ 29yo, or sooner if problems Colonoscopy: @ 29yo, or sooner if problems  Orders Placed This Encounter  Procedures   Urine Culture   POC Urinalysis Dipstick OB    Meds:  Meds ordered this encounter  Medications   LO LOESTRIN FE 1 MG-10 MCG / 10 MCG tablet    Sig: Take 1 tablet by mouth daily.    Dispense:  90 tablet    Refill:  3    For co-pay card, pt to text "Lo Loestrin Fe " to [phone]              Co-pay card must be run in second position  "other coverage code 3"  if denied d/t PA, step edit, or insurance denial    Follow-up: Return in about 3 months (around 07/31/2023) for med f/u.  Cheral Marker CNM, [facility] 04/30/2023 2:35 PM

## 2023-05-01 ENCOUNTER — Ambulatory Visit: Payer: Medicaid Other | Admitting: Family Medicine

## 2023-05-01 ENCOUNTER — Encounter: Payer: Self-pay | Admitting: Family Medicine

## 2023-05-01 VITALS — BP 107/74 | HR 98 | Temp 97.5°F | Ht 65.0 in | Wt 273.0 lb

## 2023-05-01 DIAGNOSIS — M7918 Myalgia, other site: Secondary | ICD-10-CM | POA: Diagnosis not present

## 2023-05-01 DIAGNOSIS — R718 Other abnormality of red blood cells: Secondary | ICD-10-CM | POA: Diagnosis not present

## 2023-05-01 DIAGNOSIS — E559 Vitamin D deficiency, unspecified: Secondary | ICD-10-CM

## 2023-05-01 DIAGNOSIS — F419 Anxiety disorder, unspecified: Secondary | ICD-10-CM

## 2023-05-01 DIAGNOSIS — I4719 Other supraventricular tachycardia: Secondary | ICD-10-CM

## 2023-05-01 DIAGNOSIS — G8929 Other chronic pain: Secondary | ICD-10-CM

## 2023-05-01 NOTE — Patient Instructions (Addendum)
Labs today.  Referral for counseling placed.  Regular exercise. Mediterranean diet.  Consider Cymbalta or Lyrica.  Take care  Dr. Adriana Simas

## 2023-05-02 DIAGNOSIS — M7918 Myalgia, other site: Secondary | ICD-10-CM | POA: Insufficient documentation

## 2023-05-02 LAB — URINE CULTURE

## 2023-05-02 LAB — IRON,TIBC AND FERRITIN PANEL
Ferritin: 68 ng/mL (ref 15–150)
Iron Saturation: 11 % — ABNORMAL LOW (ref 15–55)
Iron: 38 ug/dL (ref 27–159)
Total Iron Binding Capacity: 332 ug/dL (ref 250–450)

## 2023-05-02 LAB — VITAMIN D 25 HYDROXY (VIT D DEFICIENCY, FRACTURES): Vit D, 25-Hydroxy: 33.1 ng/mL (ref 30.0–100.0)

## 2023-05-02 LAB — CBC
Hematocrit: 40 % (ref 34.0–46.6)
MCH: 26.1 pg — ABNORMAL LOW (ref 26.6–33.0)
MCHC: 33.3 g/dL (ref 31.5–35.7)
MCV: 78 fL — ABNORMAL LOW (ref 79–97)
Platelets: 395 10*3/uL (ref 150–450)
RBC: 5.1 x10E6/uL (ref 3.77–5.28)
RDW: 13.9 % (ref 11.7–15.4)
WBC: 6.5 10*3/uL (ref 3.4–10.8)

## 2023-05-02 LAB — MAGNESIUM: Magnesium: 2.2 mg/dL (ref 1.6–2.3)

## 2023-05-02 NOTE — Progress Notes (Signed)
Subjective:  Patient ID: Bethany Barrera, female    DOB: [birth year]  Age: 27 y.o. MRN: [id number]  CC: Chief Complaint  Patient presents with   Establish Care    Patient seeing rheumatology for muscle and nerve pain Request referral to counseling Labs for iron panel, mag, vitd     HPI:  27 year old female with morbid obesity, GERD, anxiety, chronic pain with positive ANA (has been seen by rheumatology) presents to establish care.  Patient has been seen by rheumatology.  Per the notes, it appears that this is likely myofascial pain syndrome/fibromyalgia as opposed to other rheumatologic illnesses.  She is going to be seen again by rheumatology to discuss her laboratory results.  Patient reports chronic pain.  States that she has pain essentially all over.  Has been going on for more than 4 years.  She also reports associated dizziness, headaches, and vision change.  Has been seen by neurology.  They are rescheduling an MRI for her.  Patient states that due to her chronic pain she is interested in counseling to help her cope with this.  She would like a referral today.  Patient requesting labs regarding vitamin D, magnesium, and iron.  She has a history of iron deficiency.  Patient Active Problem List   Diagnosis Date Noted   Myofascial pain syndrome 05/02/2023   Previous cesarean section 07/06/2020   Chronic pain w/ +ANA Antibody 03/02/2020   Anxiety 02/05/2020   Vitamin D deficiency 02/25/2018   Paroxysmal atrial tachycardia by electrocardiogram 02/22/2016   GERD (gastroesophageal reflux disease)     Social Hx   Social History   Socioeconomic History   Marital status: Single    Spouse name: Not on file   Number of children: 0   Years of education: 11   Highest education level: Not on file  Occupational History   Occupation: Consulting civil engineer  Tobacco Use   Smoking status: Never    Passive exposure: Never   Smokeless tobacco: Never  Vaping Use   Vaping status: Never Used   Substance and Sexual Activity   Alcohol use: No   Drug use: No   Sexual activity: Yes    Partners: Male    Birth control/protection: None, Condom  Other Topics Concern   Not on file  Social History Narrative   Lives at home with mother and father.   Right-handed.   No caffeine use.   Social Determinants of Health   Financial Resource Strain: Low Risk  (04/30/2023)   Overall Financial Resource Strain (CARDIA)    Difficulty of Paying Living Expenses: Not hard at all  Food Insecurity: No Food Insecurity (04/30/2023)   Hunger Vital Sign    Worried About Running Out of Food in the Last Year: Never true    Ran Out of Food in the Last Year: Never true  Transportation Needs: No Transportation Needs (04/30/2023)   PRAPARE - Administrator, Civil Service (Medical): No    Lack of Transportation (Non-Medical): No  Physical Activity: Insufficiently Active (04/30/2023)   Exercise Vital Sign    Days of Exercise per Week: 2 days    Minutes of Exercise per Session: 30 min  Stress: Stress Concern Present (04/30/2023)   [employer] of Occupational Health - Occupational Stress Questionnaire    Feeling of Stress : To some extent  Social Connections: Moderately Isolated (04/30/2023)   Social Connection and Isolation Panel [NHANES]    Frequency of Communication with Friends and Family: More than three  times a week    Frequency of Social Gatherings with Friends and Family: More than three times a week    Attends Religious Services: More than 4 times per year    Active Member of Clubs or Organizations: No    Attends Engineer, structural: Never    Marital Status: Never married    Review of Systems Per HPI  Objective:  BP 107/74   Pulse 98   Temp (!) 97.5 F (36.4 C)   Ht 5\' 5"  (1.651 m)   Wt 273 lb (123.8 kg)   LMP 04/26/2023   SpO2 100%   BMI 45.43 kg/m      05/01/2023    2:11 PM 04/30/2023    1:45 PM 04/03/2023   10:34 AM  BP/Weight  Systolic BP 107 136 127   Diastolic BP 74 86 85  Wt. (Lbs) 273 273.36   BMI 45.43 kg/m2 45.49 kg/m2     Physical Exam Vitals and nursing note reviewed.  Constitutional:      Appearance: Normal appearance. She is obese.  HENT:     Head: Normocephalic and atraumatic.  Eyes:     General:        Right eye: No discharge.        Left eye: No discharge.     Conjunctiva/sclera: Conjunctivae normal.  Cardiovascular:     Rate and Rhythm: Normal rate and regular rhythm.  Pulmonary:     Effort: Pulmonary effort is normal.     Breath sounds: Normal breath sounds. No wheezing, rhonchi or rales.  Neurological:     Mental Status: She is alert.  Psychiatric:        Mood and Affect: Mood normal.        Behavior: Behavior normal.     Lab Results  Component Value Date   WBC 6.5 05/01/2023   HGB 13.3 05/01/2023   HCT 40.0 05/01/2023   PLT 395 05/01/2023   GLUCOSE 89 12/05/2022   ALT 6 12/05/2022   AST 13 12/05/2022   NA 140 12/05/2022   K 4.4 12/05/2022   CL 101 12/05/2022   CREATININE 0.78 12/05/2022   BUN 9 12/05/2022   CO2 23 12/05/2022   TSH 3.77 04/03/2023     Assessment & Plan:   Problem List Items Addressed This Visit       Musculoskeletal and Integument   Myofascial pain syndrome - Primary    Likely has myofascial pain syndrome/fibromyalgia.  We discussed medications that may help.  Patient will consider.  I advised her to discuss this with rheumatology as well.  Recommended regular exercise and healthy diet.        Other   Paroxysmal atrial tachycardia by electrocardiogram   Relevant Orders   Magnesium (Completed)   Vitamin D deficiency   Relevant Orders   Vitamin D, 25-hydroxy   Anxiety    Referring for counseling.      Relevant Orders   Ambulatory referral to Psychology   Other Visit Diagnoses     Microcytosis       Relevant Orders   CBC (Completed)   Iron, TIBC and Ferritin Panel (Completed)       Follow-up:  Return in about 3 months (around 08/01/2023).  Everlene Other DO [facility]

## 2023-05-02 NOTE — Assessment & Plan Note (Signed)
Likely has myofascial pain syndrome/fibromyalgia.  We discussed medications that may help.  Patient will consider.  I advised her to discuss this with rheumatology as well.  Recommended regular exercise and healthy diet.

## 2023-05-02 NOTE — Assessment & Plan Note (Signed)
Referring for counseling.

## 2023-05-06 ENCOUNTER — Encounter: Payer: Self-pay | Admitting: Women's Health

## 2023-05-06 ENCOUNTER — Ambulatory Visit: Payer: Medicaid Other | Admitting: Podiatry

## 2023-05-06 DIAGNOSIS — R87619 Unspecified abnormal cytological findings in specimens from cervix uteri: Secondary | ICD-10-CM | POA: Insufficient documentation

## 2023-05-08 ENCOUNTER — Telehealth: Payer: Self-pay | Admitting: *Deleted

## 2023-05-08 NOTE — Telephone Encounter (Signed)
Left message @ 12:18 pm. Advised to call back or look at [facility] message. JSY

## 2023-05-08 NOTE — Telephone Encounter (Signed)
-----   Message from Cheral Marker sent at 05/08/2023  8:01 AM EDT ----- Hasn't read [employer]. Please call and read it to her/have her read it. Thanks

## 2023-05-10 DIAGNOSIS — M2141 Flat foot [pes planus] (acquired), right foot: Secondary | ICD-10-CM | POA: Insufficient documentation

## 2023-05-10 DIAGNOSIS — E782 Mixed hyperlipidemia: Secondary | ICD-10-CM | POA: Insufficient documentation

## 2023-05-10 NOTE — Progress Notes (Signed)
Office Visit Note  Patient: Bethany Barrera             Date of Birth: [birth year]           MRN: [id number]             PCP: Tommie Sams, DO Referring: Tommie Sams, DO Visit Date: 05/24/2023 Occupation: @GUAROCC @  Subjective:  Generalized pain  History of Present Illness: HAEVEN Barrera is a 27 y.o. female with myofascial pain.  Patient states she continues to have generalized pain and discomfort.  She has pain and discomfort around her neck and trapezius region.  She also has hyperalgesia.  She continues to have insomnia due to nocturnal pain.  She has been experiencing fatigue.  She states the symptoms have not changed since she was a teenager.  The symptoms interfere with her routine activities.  She would like to be referred to pain management.  She also would like to go to aquatic therapy.She denies history of oral ulcers, nasal ulcers, malar rash, photosensitivity or lymphadenopathy.    Activities of Daily Living:  Patient reports morning stiffness for 30 minutes.   Patient Reports nocturnal pain.  Difficulty dressing/grooming: Denies Difficulty climbing stairs: Denies Difficulty getting out of chair: Denies Difficulty using hands for taps, buttons, cutlery, and/or writing: Reports  Review of Systems  Constitutional:  Positive for fatigue.  HENT:  Negative for mouth sores and mouth dryness.   Eyes:  Negative for dryness.  Respiratory:  Negative for shortness of breath.   Cardiovascular:  Negative for chest pain and palpitations.  Gastrointestinal:  Positive for constipation. Negative for blood in stool and diarrhea.  Endocrine: Positive for increased urination.  Genitourinary:  Negative for painful urination and involuntary urination.  Musculoskeletal:  Positive for joint pain, joint pain, myalgias, muscle weakness, morning stiffness, muscle tenderness and myalgias. Negative for gait problem and joint swelling.  Skin:  Positive for hair loss. Negative for color  change, rash and sensitivity to sunlight.  Allergic/Immunologic: Positive for susceptible to infections.  Neurological:  Positive for dizziness and headaches.  Hematological:  Negative for swollen glands.  Psychiatric/Behavioral:  Positive for sleep disturbance. Negative for depressed mood. The patient is nervous/anxious.     PMFS History:  Patient Active Problem List   Diagnosis Date Noted   Pes planus of both feet 05/10/2023   Mixed hyperlipidemia 05/10/2023   Abnormal Pap smear of cervix 05/06/2023   Myofascial pain syndrome 05/02/2023   Previous cesarean section 07/06/2020   Chronic pain w/ +ANA Antibody 03/02/2020   Anxiety 02/05/2020   Vitamin D deficiency 02/25/2018   Paroxysmal atrial tachycardia by electrocardiogram 02/22/2016   GERD (gastroesophageal reflux disease)     Past Medical History:  Diagnosis Date   Anxiety    Gastritis    GERD (gastroesophageal reflux disease)    Muscle weakness    Seasonal allergies    Vitamin D deficiency     Family History  Problem Relation Age of Onset   Diabetes Mother    Other Mother        gallstones   Hypertension Mother    Heart disease Mother    Hyperlipidemia Father    Other Sister        pre-diabetes   Other Sister        gallstones   Clotting disorder Maternal Grandfather    Diabetes Maternal Grandfather    Heart disease Maternal Grandfather    Breast cancer Paternal Grandmother    Healthy  Daughter    Migraines Neg Hx    Headache Neg Hx    Past Surgical History:  Procedure Laterality Date   CESAREAN SECTION N/A 05/21/2020   Procedure: CESAREAN SECTION;  Surgeon: [name], MD;  Location: [facility] LD ORS;  Service: Obstetrics;  Laterality: N/A;   NO PAST SURGERIES     Social History   Social History Narrative   Lives at home with mother and father.   Right-handed.   No caffeine use.   Immunization History  Administered Date(s) Administered   DTaP 01/25/1994, 04/02/1994, 06/18/1994, 11/26/1994   HIB  (PRP-OMP) 01/25/1994, 04/02/1994, 06/18/1994, 11/26/1994   Hepatitis B 09/13/94, 01/25/1994, 10/21/1995   IPV 01/25/1994, 04/02/1994, 06/18/1994   MMR 11/26/1994     Objective: Vital Signs: BP 123/83 (BP Location: Left Arm, Patient Position: Sitting, Cuff Size: Large)   Pulse 96   Resp 16   Ht 5' 5.5" (1.664 m)   Wt 276 lb 6.4 oz (125.4 kg)   LMP 04/26/2023   BMI 45.30 kg/m    Physical Exam Vitals and nursing note reviewed.  Constitutional:      Appearance: She is well-developed.  HENT:     Head: Normocephalic and atraumatic.  Eyes:     Conjunctiva/sclera: Conjunctivae normal.  Cardiovascular:     Rate and Rhythm: Normal rate and regular rhythm.     Heart sounds: Normal heart sounds.  Pulmonary:     Effort: Pulmonary effort is normal.     Breath sounds: Normal breath sounds.  Abdominal:     General: Bowel sounds are normal.     Palpations: Abdomen is soft.  Musculoskeletal:     Cervical back: Normal range of motion.  Lymphadenopathy:     Cervical: No cervical adenopathy.  Skin:    General: Skin is warm and dry.     Capillary Refill: Capillary refill takes less than 2 seconds.  Neurological:     Mental Status: She is alert and oriented to person, place, and time.  Psychiatric:        Behavior: Behavior normal.      Musculoskeletal Exam: Cervical, thoracic and lumbar spine were in good range of motion.  She had bilateral trapezius spasm and tenderness.  She had generalized hyperalgesia.  Shoulders, elbows, wrists, MCPs PIPs and DIPs with good range of motion with no synovitis.  Hips, knees, ankles and feet were in good range of motion with no synovitis.  She had bilateral pes planus.  CDAI Exam: CDAI Score: -- Patient Global: --; Provider Global: -- Swollen: --; Tender: -- Joint Exam 05/24/2023   No joint exam has been documented for this visit   There is currently no information documented on the homunculus. Go to the Rheumatology activity and complete the  homunculus joint exam.  Investigation: No additional findings.  Imaging: No results found.  Recent Labs: Lab Results  Component Value Date   WBC 6.5 05/01/2023   HGB 13.3 05/01/2023   PLT 395 05/01/2023   NA 140 12/05/2022   K 4.4 12/05/2022   CL 101 12/05/2022   CO2 23 12/05/2022   GLUCOSE 89 12/05/2022   BUN 9 12/05/2022   CREATININE 0.78 12/05/2022   BILITOT 0.2 12/05/2022   ALKPHOS 104 12/05/2022   AST 13 12/05/2022   ALT 6 12/05/2022   PROT 7.6 12/05/2022   ALBUMIN 4.7 12/05/2022   CALCIUM 9.5 12/05/2022   GFRAA >60 05/21/2020   April 03, 2023 C3-C4 normal, ANA 1: 80 NS, dsDNA negative, thyroglobulin antibody  negative, thyroid peroxidase antibody negative, TSH normal, CK 59, vitamin D 44  Speciality Comments: No specialty comments available.  Procedures:  No procedures performed Allergies: Sulfa antibiotics, Tramadol, Dexlansoprazole, Other, Penicillin g, and Rabeprazole   Assessment / Plan:     Visit Diagnoses: Polyarthralgia -patient gives history of generalized pain and achiness in multiple joints since she was a teenager.  Positive ANA (antinuclear antibody) - ANA low titer positive and not significant.  Double-stranded DNA negative, complements normal.  Patient has no clinical features of autoimmune disease.  Lab findings were discussed with the patient at length.  Pes planus of both feet -use of arch support was discussed.  Patient was referred to a podiatrist.  Myalgia -most likely myofascial pain syndrome/fibromyalgia.  She has generalized achiness for many years.  History of pain in muscles since she was a teenager.  Symptoms got worse in 2017.  CK normal.  She has hyperalgesia and multiple tender points today.  She currently takes meloxicam.  She would benefit from Cymbalta and muscle relaxer which she can discuss with her PCP.  Patient requested a referral to pain management.  Per her request I will place the referral.  Need for regular exercise, water  aerobics and summing were discussed.  I will also refer her to aquatic therapy per patient's request.  Trapezius muscle spasm-she had trapezius spasm.  I offered trigger point injections which she declined.  I advised her to use over-the-counter Salonpas lidocaine patches.  Other fatigue -she continues to have some fatigue.  Fatigue sleep appears to be secondary to chronic insomnia and myofascial pain.  Labs were normal which were discussed.  TSH normal, thyroglobulin antibodies negative  Other insomnia-due to nocturnal pain.  Vitamin D deficiency - Vitamin D normal at 44  Dysmenorrhea  Elevated hemoglobin A1c  Mixed hyperlipidemia  Gastroesophageal reflux disease without esophagitis  History of anxiety  History of anemia  Orders: No orders of the defined types were placed in this encounter.  No orders of the defined types were placed in this encounter.    Follow-Up Instructions: Return if symptoms worsen or fail to improve, for Myofascial pain.   Pollyann Savoy, MD  Note - This record has been created using Animal nutritionist.  Chart creation errors have been sought, but may not always  have been located. Such creation errors do not reflect on  the standard of medical care.

## 2023-05-24 ENCOUNTER — Encounter: Payer: Self-pay | Admitting: Rheumatology

## 2023-05-24 ENCOUNTER — Ambulatory Visit: Payer: Medicaid Other | Admitting: Rheumatology

## 2023-05-24 VITALS — BP 123/83 | HR 96 | Resp 16 | Ht 65.5 in | Wt 276.4 lb

## 2023-05-24 DIAGNOSIS — M2141 Flat foot [pes planus] (acquired), right foot: Secondary | ICD-10-CM | POA: Diagnosis not present

## 2023-05-24 DIAGNOSIS — Z862 Personal history of diseases of the blood and blood-forming organs and certain disorders involving the immune mechanism: Secondary | ICD-10-CM

## 2023-05-24 DIAGNOSIS — R768 Other specified abnormal immunological findings in serum: Secondary | ICD-10-CM | POA: Diagnosis not present

## 2023-05-24 DIAGNOSIS — E559 Vitamin D deficiency, unspecified: Secondary | ICD-10-CM

## 2023-05-24 DIAGNOSIS — M2550 Pain in unspecified joint: Secondary | ICD-10-CM

## 2023-05-24 DIAGNOSIS — R7309 Other abnormal glucose: Secondary | ICD-10-CM

## 2023-05-24 DIAGNOSIS — M2142 Flat foot [pes planus] (acquired), left foot: Secondary | ICD-10-CM

## 2023-05-24 DIAGNOSIS — R5383 Other fatigue: Secondary | ICD-10-CM

## 2023-05-24 DIAGNOSIS — M791 Myalgia, unspecified site: Secondary | ICD-10-CM | POA: Diagnosis not present

## 2023-05-24 DIAGNOSIS — N946 Dysmenorrhea, unspecified: Secondary | ICD-10-CM

## 2023-05-24 DIAGNOSIS — E782 Mixed hyperlipidemia: Secondary | ICD-10-CM

## 2023-05-24 DIAGNOSIS — K219 Gastro-esophageal reflux disease without esophagitis: Secondary | ICD-10-CM

## 2023-05-24 DIAGNOSIS — M62838 Other muscle spasm: Secondary | ICD-10-CM

## 2023-05-24 DIAGNOSIS — Z8659 Personal history of other mental and behavioral disorders: Secondary | ICD-10-CM

## 2023-05-24 DIAGNOSIS — G4709 Other insomnia: Secondary | ICD-10-CM

## 2023-05-24 NOTE — Patient Instructions (Signed)
We will refer you to pain management. We will refer you to aquatic therapy.  Please join [employer] for water aerobics classes.  Water aerobics classes for fibromyalgia will be beneficial for you.  Cervical Strain and Sprain Rehab Ask your health care provider which exercises are safe for you. Do exercises exactly as told by your health care provider and adjust them as directed. It is normal to feel mild stretching, pulling, tightness, or discomfort as you do these exercises. Stop right away if you feel sudden pain or your pain gets worse. Do not begin these exercises until told by your health care provider. Stretching and range-of-motion exercises Cervical side bending  Using good posture, sit on a stable chair or stand up. Without moving your shoulders, slowly tilt your left / right ear to your shoulder until you feel a stretch in the neck muscles on the opposite side. You should be looking straight ahead. Hold for __________ seconds. Repeat with the other side of your neck. Repeat __________ times. Complete this exercise __________ times a day. Cervical rotation  Using good posture, sit on a stable chair or stand up. Slowly turn your head to the side as if you are looking over your left / right shoulder. Keep your eyes level with the ground. Stop when you feel a stretch along the side and the back of your neck. Hold for __________ seconds. Repeat this by turning to your other side. Repeat __________ times. Complete this exercise __________ times a day. Thoracic extension and pectoral stretch  Roll a towel or a small blanket so it is about 4 inches (10 cm) in diameter. Lie down on your back on a firm surface. Put the towel in the middle of your back across your spine. It should not be under your shoulder blades. Put your hands behind your head and let your elbows fall out to your sides. Hold for __________ seconds. Repeat __________ times. Complete this exercise __________ times a  day. Strengthening exercises Upper cervical flexion  Lie on your back with a thin pillow behind your head or a small, rolled-up towel under your neck. Gently tuck your chin toward your chest and nod your head down to look toward your feet. Do not lift your head off the pillow. Hold for __________ seconds. Release the tension slowly. Relax your neck muscles completely before you repeat this exercise. Repeat __________ times. Complete this exercise __________ times a day. Cervical extension  Stand about 6 inches (15 cm) away from a wall, with your back facing the wall. Place a soft object, about 6-8 inches (15-20 cm) in diameter, between the back of your head and the wall. A soft object could be a small pillow, a ball, or a folded towel. Gently tilt your head back and press into the soft object. Keep your jaw and forehead relaxed. Hold for __________ seconds. Release the tension slowly. Relax your neck muscles completely before you repeat this exercise. Repeat __________ times. Complete this exercise __________ times a day. Posture and body mechanics Body mechanics refer to the movements and positions of your body while you do your daily activities. Posture is part of body mechanics. Good posture and healthy body mechanics can help to relieve stress in your body's tissues and joints. Good posture means that your spine is in its natural S-curve position (your spine is neutral), your shoulders are pulled back slightly, and your head is not tipped forward. The following are general guidelines for using improved posture and body mechanics in  your everyday activities. Sitting  When sitting, keep your spine neutral and keep your feet flat on the floor. Use a footrest, if needed, and keep your thighs parallel to the floor. Avoid rounding your shoulders. Avoid tilting your head forward. When working at a desk or a computer, keep your desk at a height where your hands are slightly lower than your elbows.  Slide your chair under your desk so you are close enough to maintain good posture. When working at a computer, place your monitor at a height where you are looking straight ahead and you do not have to tilt your head forward or downward to look at the screen. Standing  When standing, keep your spine neutral and keep your feet about hip-width apart. Keep a slight bend in your knees. Your ears, shoulders, and hips should line up. When you do a task in which you stand in one place for a long time, place one foot up on a stable object that is 2-4 inches (5-10 cm) high, such as a footstool. This helps keep your spine neutral. Resting When lying down and resting, avoid positions that are most painful for you. Try to support your neck in a neutral position. You can use a contour pillow or a small rolled-up towel. Your pillow should support your neck but not push on it. This information is not intended to replace advice given to you by your health care provider. Make sure you discuss any questions you have with your health care provider. Document Revised: 01/28/2023 Document Reviewed: 04/16/2022 Elsevier Patient Education  2024 [employer].

## 2023-05-24 NOTE — Addendum Note (Signed)
Addended by: Ellen Henri on: 05/24/2023 08:38 AM   Modules accepted: Orders

## 2023-05-27 ENCOUNTER — Ambulatory Visit: Payer: Medicaid Other | Admitting: Podiatry

## 2023-05-30 ENCOUNTER — Encounter: Payer: Self-pay | Admitting: Physical Medicine and Rehabilitation

## 2023-06-11 ENCOUNTER — Encounter: Payer: Medicaid Other | Admitting: Physical Medicine and Rehabilitation

## 2023-06-13 ENCOUNTER — Ambulatory Visit: Payer: Medicaid Other | Admitting: Podiatry

## 2023-06-18 ENCOUNTER — Telehealth: Payer: Self-pay | Admitting: Neurology

## 2023-06-18 NOTE — Telephone Encounter (Signed)
Spoke to patient  Pt states couldn't complete MRI due to having to place head support on head was not able to tolerate head support  due to head hurtling and sore. Pt is requesting a open MRI Informed pt will send request to Dr Lucia Gaskins Informed pt if continuing symptoms persist please go to urgent care or ED to get evaluated

## 2023-06-18 NOTE — Telephone Encounter (Signed)
LVM for pt to call back to discuss MRI procedure

## 2023-06-18 NOTE — Telephone Encounter (Signed)
Pt called to notify; Attempted to get MRI a few minutes ago, but could not continue because was in so much pain. Would like a call from the nurse to discuss sending somewhere to get the MRI.

## 2023-06-19 NOTE — Telephone Encounter (Signed)
Ordered open MRI at [facility], thank you

## 2023-06-19 NOTE — Addendum Note (Signed)
Addended by: Naomie Dean B on: 06/19/2023 04:43 PM   Modules accepted: Orders

## 2023-06-19 NOTE — Telephone Encounter (Signed)
Spoke to pt aware that  Dr Lucia Gaskins ordered Open  MRI at [facility] Pt thanked me for calling

## 2023-06-20 ENCOUNTER — Ambulatory Visit: Payer: Medicaid Other | Admitting: Podiatry

## 2023-06-26 NOTE — Telephone Encounter (Signed)
[employer] medicaid Berkley Harvey: [id number] exp. 06/26/23-08/10/23

## 2023-06-26 NOTE — Telephone Encounter (Signed)
Order sent to [facility]. [id number]

## 2023-07-04 ENCOUNTER — Ambulatory Visit: Payer: Medicaid Other | Admitting: Podiatry

## 2023-07-11 ENCOUNTER — Telehealth (HOSPITAL_COMMUNITY): Payer: Self-pay

## 2023-07-11 NOTE — Telephone Encounter (Signed)
Lvm to confirm 07/15/23 appt advised to call back by 12:00 pm tomorrow

## 2023-07-12 ENCOUNTER — Encounter (HOSPITAL_COMMUNITY): Payer: Self-pay

## 2023-07-12 NOTE — Telephone Encounter (Signed)
Appt not confirmed cancelled mychart message sent

## 2023-07-15 ENCOUNTER — Ambulatory Visit (HOSPITAL_COMMUNITY): Payer: Medicaid Other | Admitting: Psychiatry

## 2023-07-16 ENCOUNTER — Encounter: Payer: Medicaid Other | Admitting: Physical Medicine and Rehabilitation

## 2023-07-25 ENCOUNTER — Ambulatory Visit: Payer: Medicaid Other | Admitting: Podiatry

## 2023-08-01 ENCOUNTER — Ambulatory Visit: Payer: Medicaid Other | Admitting: Family Medicine

## 2023-08-19 ENCOUNTER — Encounter: Payer: Medicaid Other | Admitting: Physical Medicine and Rehabilitation

## 2023-08-22 ENCOUNTER — Ambulatory Visit: Payer: Medicaid Other | Admitting: Podiatry

## 2023-09-09 NOTE — Progress Notes (Unsigned)
Office Visit Note  Patient: Bethany Barrera             Date of Birth: 05/04/1996           MRN: [id number]             PCP: Tommie Sams, DO Referring: Tommie Sams, DO Visit Date: 09/18/2023 Occupation: @GUAROCC @  Subjective:  No chief complaint on file.   History of Present Illness: Bethany Barrera is a 27 y.o. female ***     Activities of Daily Living:  Patient reports morning stiffness for *** {minute/hour:19697}.   Patient {ACTIONS;DENIES/REPORTS:21021675::"Denies"} nocturnal pain.  Difficulty dressing/grooming: {ACTIONS;DENIES/REPORTS:21021675::"Denies"} Difficulty climbing stairs: {ACTIONS;DENIES/REPORTS:21021675::"Denies"} Difficulty getting out of chair: {ACTIONS;DENIES/REPORTS:21021675::"Denies"} Difficulty using hands for taps, buttons, cutlery, and/or writing: {ACTIONS;DENIES/REPORTS:21021675::"Denies"}  No Rheumatology ROS completed.   PMFS History:  Patient Active Problem List   Diagnosis Date Noted   Pes planus of both feet 05/10/2023   Mixed hyperlipidemia 05/10/2023   Abnormal Pap smear of cervix 05/06/2023   Myofascial pain syndrome 05/02/2023   Previous cesarean section 07/06/2020   Chronic pain w/ +ANA Antibody 03/02/2020   Anxiety 02/05/2020   Vitamin D deficiency 02/25/2018   Paroxysmal atrial tachycardia by electrocardiogram (HCC) 02/22/2016   GERD (gastroesophageal reflux disease)     Past Medical History:  Diagnosis Date   Anxiety    Gastritis    GERD (gastroesophageal reflux disease)    Muscle weakness    Seasonal allergies    Vitamin D deficiency     Family History  Problem Relation Age of Onset   Diabetes Mother    Other Mother        gallstones   Hypertension Mother    Heart disease Mother    Hyperlipidemia Father    Other Sister        pre-diabetes   Other Sister        gallstones   Clotting disorder Maternal Grandfather    Diabetes Maternal Grandfather    Heart disease Maternal Grandfather    Breast cancer  Paternal Grandmother    Healthy Daughter    Migraines Neg Hx    Headache Neg Hx    Past Surgical History:  Procedure Laterality Date   CESAREAN SECTION N/A 05/21/2020   Procedure: CESAREAN SECTION;  Surgeon: [name], MD;  Location: [facility] LD ORS;  Service: Obstetrics;  Laterality: N/A;   NO PAST SURGERIES     Social History   Social History Narrative   Lives at home with mother and father.   Right-handed.   No caffeine use.   Immunization History  Administered Date(s) Administered   DTaP 01/25/1994, 04/02/1994, 06/18/1994, 11/26/1994   HIB (PRP-OMP) 01/25/1994, 04/02/1994, 06/18/1994, 11/26/1994   Hepatitis B Aug 15, 1994, 01/25/1994, 10/21/1995   IPV 01/25/1994, 04/02/1994, 06/18/1994   MMR 11/26/1994     Objective: Vital Signs: There were no vitals taken for this visit.   Physical Exam   Musculoskeletal Exam: ***  CDAI Exam: CDAI Score: -- Patient Global: --; Provider Global: -- Swollen: --; Tender: -- Joint Exam 09/18/2023   No joint exam has been documented for this visit   There is currently no information documented on the homunculus. Go to the Rheumatology activity and complete the homunculus joint exam.  Investigation: No additional findings.  Imaging: No results found.  Recent Labs: Lab Results  Component Value Date   WBC 6.5 05/01/2023   HGB 13.3 05/01/2023   PLT 395 05/01/2023   NA 140 12/05/2022   K  4.4 12/05/2022   CL 101 12/05/2022   CO2 23 12/05/2022   GLUCOSE 89 12/05/2022   BUN 9 12/05/2022   CREATININE 0.78 12/05/2022   BILITOT 0.2 12/05/2022   ALKPHOS 104 12/05/2022   AST 13 12/05/2022   ALT 6 12/05/2022   PROT 7.6 12/05/2022   ALBUMIN 4.7 12/05/2022   CALCIUM 9.5 12/05/2022   GFRAA >60 05/21/2020    Speciality Comments: No specialty comments available.  Procedures:  No procedures performed Allergies: Sulfa antibiotics, Tramadol, Dexlansoprazole, Other, Penicillin g, and Rabeprazole   Assessment / Plan:     Visit  Diagnoses: No diagnosis found.  Orders: No orders of the defined types were placed in this encounter.  No orders of the defined types were placed in this encounter.   Face-to-face time spent with patient was *** minutes. Greater than 50% of time was spent in counseling and coordination of care.  Follow-Up Instructions: No follow-ups on file.   Ellen Henri, CMA  Note - This record has been created using Animal nutritionist.  Chart creation errors have been sought, but may not always  have been located. Such creation errors do not reflect on  the standard of medical care.

## 2023-09-18 ENCOUNTER — Ambulatory Visit: Payer: Medicaid Other | Admitting: Rheumatology

## 2023-09-18 DIAGNOSIS — R7309 Other abnormal glucose: Secondary | ICD-10-CM

## 2023-09-18 DIAGNOSIS — M2550 Pain in unspecified joint: Secondary | ICD-10-CM

## 2023-09-18 DIAGNOSIS — M791 Myalgia, unspecified site: Secondary | ICD-10-CM

## 2023-09-18 DIAGNOSIS — M62838 Other muscle spasm: Secondary | ICD-10-CM

## 2023-09-18 DIAGNOSIS — N946 Dysmenorrhea, unspecified: Secondary | ICD-10-CM

## 2023-09-18 DIAGNOSIS — M2142 Flat foot [pes planus] (acquired), left foot: Secondary | ICD-10-CM

## 2023-09-18 DIAGNOSIS — E559 Vitamin D deficiency, unspecified: Secondary | ICD-10-CM

## 2023-09-18 DIAGNOSIS — R5383 Other fatigue: Secondary | ICD-10-CM

## 2023-09-18 DIAGNOSIS — G4709 Other insomnia: Secondary | ICD-10-CM

## 2023-09-18 DIAGNOSIS — K219 Gastro-esophageal reflux disease without esophagitis: Secondary | ICD-10-CM

## 2023-09-18 DIAGNOSIS — Z862 Personal history of diseases of the blood and blood-forming organs and certain disorders involving the immune mechanism: Secondary | ICD-10-CM

## 2023-09-18 DIAGNOSIS — E782 Mixed hyperlipidemia: Secondary | ICD-10-CM

## 2023-09-18 DIAGNOSIS — R768 Other specified abnormal immunological findings in serum: Secondary | ICD-10-CM

## 2023-09-18 DIAGNOSIS — Z8659 Personal history of other mental and behavioral disorders: Secondary | ICD-10-CM

## 2023-10-30 NOTE — Progress Notes (Deleted)
 Office Visit Note  Patient: Bethany Barrera             Date of Birth: Oct 21, 1995           MRN: [id number]             PCP: Cook, Jayce G, DO Referring: Cook, Jayce G, DO Visit Date: 11/13/2023 Occupation: @GUAROCC @  Subjective:  No chief complaint on file.   History of Present Illness: Bethany Barrera is a 28 y.o. female ***     Activities of Daily Living:  Patient reports morning stiffness for *** {minute/hour:19697}.   Patient {ACTIONS;DENIES/REPORTS:21021675::Denies} nocturnal pain.  Difficulty dressing/grooming: {ACTIONS;DENIES/REPORTS:21021675::Denies} Difficulty climbing stairs: {ACTIONS;DENIES/REPORTS:21021675::Denies} Difficulty getting out of chair: {ACTIONS;DENIES/REPORTS:21021675::Denies} Difficulty using hands for taps, buttons, cutlery, and/or writing: {ACTIONS;DENIES/REPORTS:21021675::Denies}  No Rheumatology ROS completed.   PMFS History:  Patient Active Problem List   Diagnosis Date Noted   Pes planus of both feet 05/10/2023   Mixed hyperlipidemia 05/10/2023   Abnormal Pap smear of cervix 05/06/2023   Myofascial pain syndrome 05/02/2023   Previous cesarean section 07/06/2020   Chronic pain w/ +ANA Antibody 03/02/2020   Anxiety 02/05/2020   Vitamin D  deficiency 02/25/2018   Paroxysmal atrial tachycardia by electrocardiogram (HCC) 02/22/2016   GERD (gastroesophageal reflux disease)     Past Medical History:  Diagnosis Date   Anxiety    Gastritis    GERD (gastroesophageal reflux disease)    Muscle weakness    Seasonal allergies    Vitamin D  deficiency     Family History  Problem Relation Age of Onset   Diabetes Mother    Other Mother        gallstones   Hypertension Mother    Heart disease Mother    Hyperlipidemia Father    Other Sister        pre-diabetes   Other Sister        gallstones   Clotting disorder Maternal Grandfather    Diabetes Maternal Grandfather    Heart disease Maternal Grandfather    Breast cancer  Paternal Grandmother    Healthy Daughter    Migraines Neg Hx    Headache Neg Hx    Past Surgical History:  Procedure Laterality Date   CESAREAN SECTION N/A 05/21/2020   Procedure: CESAREAN SECTION;  Surgeon: Edsel Norleen GAILS, MD;  Location: [facility];  Service: Obstetrics;  Laterality: N/A;   NO PAST SURGERIES     Social History   Social History Narrative   Lives at home with mother and father.   Right-handed.   No caffeine use.   Immunization History  Administered Date(s) Administered   DTaP 01/25/1994, 04/02/1994, 06/18/1994, 11/26/1994   HIB (PRP-OMP) 01/25/1994, 04/02/1994, 06/18/1994, 11/26/1994   Hepatitis B 02/07/94, 01/25/1994, 10/21/1995   IPV 01/25/1994, 04/02/1994, 06/18/1994   MMR 11/26/1994     Objective: Vital Signs: There were no vitals taken for this visit.   Physical Exam   Musculoskeletal Exam: ***  CDAI Exam: CDAI Score: -- Patient Global: --; Provider Global: -- Swollen: --; Tender: -- Joint Exam 11/13/2023   No joint exam has been documented for this visit   There is currently no information documented on the homunculus. Go to the Rheumatology activity and complete the homunculus joint exam.  Investigation: No additional findings.  Imaging: No results found.  Recent Labs: Lab Results  Component Value Date   WBC 6.5 05/01/2023   HGB 13.3 05/01/2023   PLT 395 05/01/2023   NA 140 12/05/2022   K  4.4 12/05/2022   CL 101 12/05/2022   CO2 23 12/05/2022   GLUCOSE 89 12/05/2022   BUN 9 12/05/2022   CREATININE 0.78 12/05/2022   BILITOT 0.2 12/05/2022   ALKPHOS 104 12/05/2022   AST 13 12/05/2022   ALT 6 12/05/2022   PROT 7.6 12/05/2022   ALBUMIN 4.7 12/05/2022   CALCIUM 9.5 12/05/2022   GFRAA >60 05/21/2020    Speciality Comments: No specialty comments available.  Procedures:  No procedures performed Allergies: Sulfa antibiotics, Tramadol, Dexlansoprazole , Other, Penicillin  g, and Rabeprazole    Assessment / Plan:     Visit  Diagnoses: No diagnosis found.  Orders: No orders of the defined types were placed in this encounter.  No orders of the defined types were placed in this encounter.   Face-to-face time spent with patient was *** minutes. Greater than 50% of time was spent in counseling and coordination of care.  Follow-Up Instructions: No follow-ups on file.   Daved JAYSON Gavel, CMA  Note - This record has been created using Animal nutritionist.  Chart creation errors have been sought, but may not always  have been located. Such creation errors do not reflect on  the standard of medical care.

## 2023-11-13 ENCOUNTER — Ambulatory Visit: Payer: Medicaid Other | Admitting: Rheumatology

## 2023-11-13 DIAGNOSIS — M2141 Flat foot [pes planus] (acquired), right foot: Secondary | ICD-10-CM

## 2023-11-13 DIAGNOSIS — K219 Gastro-esophageal reflux disease without esophagitis: Secondary | ICD-10-CM

## 2023-11-13 DIAGNOSIS — M62838 Other muscle spasm: Secondary | ICD-10-CM

## 2023-11-13 DIAGNOSIS — R768 Other specified abnormal immunological findings in serum: Secondary | ICD-10-CM

## 2023-11-13 DIAGNOSIS — N946 Dysmenorrhea, unspecified: Secondary | ICD-10-CM

## 2023-11-13 DIAGNOSIS — M791 Myalgia, unspecified site: Secondary | ICD-10-CM

## 2023-11-13 DIAGNOSIS — Z8659 Personal history of other mental and behavioral disorders: Secondary | ICD-10-CM

## 2023-11-13 DIAGNOSIS — R5383 Other fatigue: Secondary | ICD-10-CM

## 2023-11-13 DIAGNOSIS — M2550 Pain in unspecified joint: Secondary | ICD-10-CM

## 2023-11-13 DIAGNOSIS — E559 Vitamin D deficiency, unspecified: Secondary | ICD-10-CM

## 2023-11-13 DIAGNOSIS — R7309 Other abnormal glucose: Secondary | ICD-10-CM

## 2023-11-13 DIAGNOSIS — G4709 Other insomnia: Secondary | ICD-10-CM

## 2023-11-13 DIAGNOSIS — E782 Mixed hyperlipidemia: Secondary | ICD-10-CM

## 2023-11-13 DIAGNOSIS — Z862 Personal history of diseases of the blood and blood-forming organs and certain disorders involving the immune mechanism: Secondary | ICD-10-CM

## 2024-02-27 NOTE — Progress Notes (Deleted)
 Office Visit Note  Patient: Bethany Barrera             Date of Birth: 1996/08/08           MRN: [id number]             PCP: Cook, Jayce G, DO Referring: Cook, Jayce G, DO Visit Date: 03/12/2024 Occupation: @GUAROCC @  Subjective:  No chief complaint on file.   History of Present Illness: Bethany Barrera is a 28 y.o. female ***     Activities of Daily Living:  Patient reports morning stiffness for *** {minute/hour:19697}.   Patient {ACTIONS;DENIES/REPORTS:21021675::"Denies"} nocturnal pain.  Difficulty dressing/grooming: {ACTIONS;DENIES/REPORTS:21021675::"Denies"} Difficulty climbing stairs: {ACTIONS;DENIES/REPORTS:21021675::"Denies"} Difficulty getting out of chair: {ACTIONS;DENIES/REPORTS:21021675::"Denies"} Difficulty using hands for taps, buttons, cutlery, and/or writing: {ACTIONS;DENIES/REPORTS:21021675::"Denies"}  No Rheumatology ROS completed.   PMFS History:  Patient Active Problem List   Diagnosis Date Noted   Pes planus of both feet 05/10/2023   Mixed hyperlipidemia 05/10/2023   Abnormal Pap smear of cervix 05/06/2023   Myofascial pain syndrome 05/02/2023   Previous cesarean section 07/06/2020   Chronic pain w/ +ANA Antibody 03/02/2020   Anxiety 02/05/2020   Vitamin D  deficiency 02/25/2018   Paroxysmal atrial tachycardia by electrocardiogram (HCC) 02/22/2016   GERD (gastroesophageal reflux disease)     Past Medical History:  Diagnosis Date   Anxiety    Gastritis    GERD (gastroesophageal reflux disease)    Muscle weakness    Seasonal allergies    Vitamin D  deficiency     Family History  Problem Relation Age of Onset   Diabetes Mother    Other Mother        gallstones   Hypertension Mother    Heart disease Mother    Hyperlipidemia Father    Other Sister        pre-diabetes   Other Sister        gallstones   Clotting disorder Maternal Grandfather    Diabetes Maternal Grandfather    Heart disease Maternal Grandfather    Breast cancer  Paternal Grandmother    Healthy Daughter    Migraines Neg Hx    Headache Neg Hx    Past Surgical History:  Procedure Laterality Date   CESAREAN SECTION N/A 05/21/2020   Procedure: CESAREAN SECTION;  Surgeon: Albino Hum, MD;  Location: [facility];  Service: Obstetrics;  Laterality: N/A;   NO PAST SURGERIES     Social History   Social History Narrative   Lives at home with mother and father.   Right-handed.   No caffeine use.   Immunization History  Administered Date(s) Administered   DTaP 01/25/1994, 04/02/1994, 06/18/1994, 11/26/1994   HIB (PRP-OMP) 01/25/1994, 04/02/1994, 06/18/1994, 11/26/1994   Hepatitis B 03-30-1994, 01/25/1994, 10/21/1995   IPV 01/25/1994, 04/02/1994, 06/18/1994   MMR 11/26/1994     Objective: Vital Signs: There were no vitals taken for this visit.   Physical Exam   Musculoskeletal Exam: ***  CDAI Exam: CDAI Score: -- Patient Global: --; Provider Global: -- Swollen: --; Tender: -- Joint Exam 03/12/2024   No joint exam has been documented for this visit   There is currently no information documented on the homunculus. Go to the Rheumatology activity and complete the homunculus joint exam.  Investigation: No additional findings.  Imaging: No results found.  Recent Labs: Lab Results  Component Value Date   WBC 6.5 05/01/2023   HGB 13.3 05/01/2023   PLT 395 05/01/2023   NA 140 12/05/2022   K  4.4 12/05/2022   CL 101 12/05/2022   CO2 23 12/05/2022   GLUCOSE 89 12/05/2022   BUN 9 12/05/2022   CREATININE 0.78 12/05/2022   BILITOT 0.2 12/05/2022   ALKPHOS 104 12/05/2022   AST 13 12/05/2022   ALT 6 12/05/2022   PROT 7.6 12/05/2022   ALBUMIN 4.7 12/05/2022   CALCIUM 9.5 12/05/2022   GFRAA >60 05/21/2020    Speciality Comments: No specialty comments available.  Procedures:  No procedures performed Allergies: Sulfa antibiotics, Tramadol, Dexlansoprazole , Other, Penicillin  g, and Rabeprazole    Assessment / Plan:     Visit  Diagnoses: No diagnosis found.  Orders: No orders of the defined types were placed in this encounter.  No orders of the defined types were placed in this encounter.   Face-to-face time spent with patient was *** minutes. Greater than 50% of time was spent in counseling and coordination of care.  Follow-Up Instructions: No follow-ups on file.   Dee Farber, CMA  Note - This record has been created using Animal nutritionist.  Chart creation errors have been sought, but may not always  have been located. Such creation errors do not reflect on  the standard of medical care.

## 2024-03-12 ENCOUNTER — Ambulatory Visit: Payer: Medicaid Other | Admitting: Rheumatology

## 2024-03-12 DIAGNOSIS — M2141 Flat foot [pes planus] (acquired), right foot: Secondary | ICD-10-CM

## 2024-03-12 DIAGNOSIS — R7309 Other abnormal glucose: Secondary | ICD-10-CM

## 2024-03-12 DIAGNOSIS — E559 Vitamin D deficiency, unspecified: Secondary | ICD-10-CM

## 2024-03-12 DIAGNOSIS — K219 Gastro-esophageal reflux disease without esophagitis: Secondary | ICD-10-CM

## 2024-03-12 DIAGNOSIS — N946 Dysmenorrhea, unspecified: Secondary | ICD-10-CM

## 2024-03-12 DIAGNOSIS — G4709 Other insomnia: Secondary | ICD-10-CM

## 2024-03-12 DIAGNOSIS — E782 Mixed hyperlipidemia: Secondary | ICD-10-CM

## 2024-03-12 DIAGNOSIS — Z8659 Personal history of other mental and behavioral disorders: Secondary | ICD-10-CM

## 2024-03-12 DIAGNOSIS — R5383 Other fatigue: Secondary | ICD-10-CM

## 2024-03-12 DIAGNOSIS — R768 Other specified abnormal immunological findings in serum: Secondary | ICD-10-CM

## 2024-03-12 DIAGNOSIS — M62838 Other muscle spasm: Secondary | ICD-10-CM

## 2024-03-12 DIAGNOSIS — Z862 Personal history of diseases of the blood and blood-forming organs and certain disorders involving the immune mechanism: Secondary | ICD-10-CM

## 2024-03-12 DIAGNOSIS — M791 Myalgia, unspecified site: Secondary | ICD-10-CM

## 2024-03-12 DIAGNOSIS — M2550 Pain in unspecified joint: Secondary | ICD-10-CM
